# Patient Record
Sex: Male | Born: 1995 | Hispanic: No | Marital: Single | Smoking: Current every day smoker
Health system: Southern US, Community
[De-identification: ages and names within clinical notes are randomized; demographics above are authoritative.]

## PROBLEM LIST (undated history)

## (undated) DIAGNOSIS — R569 Unspecified convulsions: Secondary | ICD-10-CM

## (undated) DIAGNOSIS — F938 Other childhood emotional disorders: Secondary | ICD-10-CM

## (undated) DIAGNOSIS — F32A Depression, unspecified: Secondary | ICD-10-CM

## (undated) DIAGNOSIS — F329 Major depressive disorder, single episode, unspecified: Secondary | ICD-10-CM

## (undated) HISTORY — PX: CIRCUMCISION: SHX1350

---
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
## 2016-01-01 LAB — CBC WITH DIFFERENTIAL/PLATELET
Basophils Absolute: 0 cells/uL (ref 0–200)
Basophils Relative: 0 %
EOS ABS: 155 {cells}/uL (ref 15–500)
Eosinophils Relative: 5 %
HEMATOCRIT: 39.9 % (ref 38.5–50.0)
Hemoglobin: 13.1 g/dL — ABNORMAL LOW (ref 13.2–17.1)
LYMPHS PCT: 61 %
Lymphs Abs: 1891 cells/uL (ref 850–3900)
MCH: 30.3 pg (ref 27.0–33.0)
MCHC: 32.8 g/dL (ref 32.0–36.0)
MCV: 92.1 fL (ref 80.0–100.0)
MONO ABS: 341 {cells}/uL (ref 200–950)
MPV: 8.8 fL (ref 7.5–12.5)
Monocytes Relative: 11 %
NEUTROS PCT: 23 %
Neutro Abs: 713 cells/uL — ABNORMAL LOW (ref 1500–7800)
Platelets: 231 10*3/uL (ref 140–400)
RBC: 4.33 MIL/uL (ref 4.20–5.80)
RDW: 13.2 % (ref 11.0–15.0)
WBC: 3.1 10*3/uL — AB (ref 3.8–10.8)

## 2016-01-01 LAB — COMPREHENSIVE METABOLIC PANEL
ALBUMIN: 4.3 g/dL (ref 3.6–5.1)
ALT: 9 U/L (ref 8–46)
AST: 15 U/L (ref 12–32)
Alkaline Phosphatase: 66 U/L (ref 48–230)
BILIRUBIN TOTAL: 0.4 mg/dL (ref 0.2–1.1)
BUN: 4 mg/dL — AB (ref 7–20)
CHLORIDE: 102 mmol/L (ref 98–110)
CO2: 29 mmol/L (ref 20–31)
CREATININE: 0.69 mg/dL (ref 0.60–1.26)
Calcium: 9.1 mg/dL (ref 8.9–10.4)
Glucose, Bld: 81 mg/dL (ref 70–99)
Potassium: 3.9 mmol/L (ref 3.8–5.1)
SODIUM: 140 mmol/L (ref 135–146)
TOTAL PROTEIN: 7.4 g/dL (ref 6.3–8.2)

## 2016-01-01 LAB — LIPASE: LIPASE: 10 U/L (ref 7–60)

## 2016-01-01 LAB — AMMONIA

## 2016-01-01 LAB — VALPROIC ACID LEVEL: VALPROIC ACID LVL: 95.6 ug/mL (ref 50.0–100.0)

## 2016-01-01 LAB — TSH: TSH: 4.21 m[IU]/L (ref 0.50–4.30)

## 2016-01-01 LAB — AMYLASE: Amylase: 101 U/L (ref 0–105)

## 2016-01-18 ENCOUNTER — Emergency Department (HOSPITAL_COMMUNITY)
Admission: EM | Admit: 2016-01-18 | Discharge: 2016-01-19 | Disposition: A | Discharge status: Other Acute Inpatient Hospital | Payer: Medicaid Other | Attending: Emergency Medicine | Admitting: Emergency Medicine

## 2016-01-18 ENCOUNTER — Encounter (HOSPITAL_COMMUNITY): Payer: Self-pay | Admitting: Emergency Medicine

## 2016-01-18 DIAGNOSIS — Z833 Family history of diabetes mellitus: Secondary | ICD-10-CM

## 2016-01-18 DIAGNOSIS — T50904A Poisoning by unspecified drugs, medicaments and biological substances, undetermined, initial encounter: Secondary | ICD-10-CM

## 2016-01-18 DIAGNOSIS — F332 Major depressive disorder, recurrent severe without psychotic features: Secondary | ICD-10-CM

## 2016-01-18 DIAGNOSIS — Z91011 Allergy to milk products: Secondary | ICD-10-CM

## 2016-01-18 DIAGNOSIS — T450X2A Poisoning by antiallergic and antiemetic drugs, intentional self-harm, initial encounter: Secondary | ICD-10-CM | POA: Insufficient documentation

## 2016-01-18 DIAGNOSIS — T391X2A Poisoning by 4-Aminophenol derivatives, intentional self-harm, initial encounter: Secondary | ICD-10-CM | POA: Insufficient documentation

## 2016-01-18 DIAGNOSIS — Z8249 Family history of ischemic heart disease and other diseases of the circulatory system: Secondary | ICD-10-CM | POA: Diagnosis not present

## 2016-01-18 DIAGNOSIS — Z79899 Other long term (current) drug therapy: Secondary | ICD-10-CM | POA: Insufficient documentation

## 2016-01-18 LAB — HEPATIC FUNCTION PANEL
ALBUMIN: 3.9 g/dL (ref 3.5–5.0)
ALK PHOS: 67 U/L (ref 38–126)
ALT: 13 U/L — AB (ref 17–63)
AST: 18 U/L (ref 15–41)
Bilirubin, Direct: <0.1 mg/dL — ABNORMAL LOW (ref 0.1–0.5)
TOTAL PROTEIN: 7 g/dL (ref 6.5–8.1)
Total Bilirubin: 0.6 mg/dL (ref 0.3–1.2)

## 2016-01-18 LAB — COMPREHENSIVE METABOLIC PANEL
ALBUMIN: 5 g/dL (ref 3.5–5.0)
ALT: 15 U/L — ABNORMAL LOW (ref 17–63)
ANION GAP: 10 (ref 5–15)
AST: 24 U/L (ref 15–41)
Alkaline Phosphatase: 83 U/L (ref 38–126)
BILIRUBIN TOTAL: 0.6 mg/dL (ref 0.3–1.2)
BUN: 10 mg/dL (ref 6–20)
CO2: 27 mmol/L (ref 22–32)
Calcium: 9.5 mg/dL (ref 8.9–10.3)
Chloride: 101 mmol/L (ref 101–111)
Creatinine, Ser: 0.85 mg/dL (ref 0.61–1.24)
GFR calc non Af Amer: >60 mL/min (ref >60)
GLUCOSE: 104 mg/dL — AB (ref 65–99)
POTASSIUM: 3.3 mmol/L — AB (ref 3.5–5.1)
SODIUM: 138 mmol/L (ref 135–145)
TOTAL PROTEIN: 9 g/dL — AB (ref 6.5–8.1)

## 2016-01-18 LAB — CBC
HEMATOCRIT: 43.3 % (ref 39.0–52.0)
Hemoglobin: 14.7 g/dL (ref 13.0–17.0)
MCH: 30.5 pg (ref 26.0–34.0)
MCHC: 33.9 g/dL (ref 30.0–36.0)
MCV: 89.8 fL (ref 78.0–100.0)
Platelets: 323 10*3/uL (ref 150–400)
RBC: 4.82 MIL/uL (ref 4.22–5.81)
RDW: 12.3 % (ref 11.5–15.5)
WBC: 5.2 10*3/uL (ref 4.0–10.5)

## 2016-01-18 LAB — RAPID URINE DRUG SCREEN, HOSP PERFORMED
Amphetamines: NOT DETECTED
Barbiturates: NOT DETECTED
Benzodiazepines: NOT DETECTED
COCAINE: NOT DETECTED
OPIATES: NOT DETECTED
TETRAHYDROCANNABINOL: NOT DETECTED

## 2016-01-18 LAB — ACETAMINOPHEN LEVEL: Acetaminophen (Tylenol), Serum: 38 ug/mL — ABNORMAL HIGH (ref 10–30)

## 2016-01-18 LAB — ETHANOL

## 2016-01-18 LAB — SALICYLATE LEVEL: Salicylate Lvl: <7 mg/dL (ref 2.8–30.0)

## 2016-01-18 MED ORDER — FLUVOXAMINE MALEATE 100 MG PO TABS
100.0000 mg | ORAL_TABLET | Freq: Two times a day (BID) | ORAL | Status: DC
  Administered 2016-01-18 – 2016-01-19 (×3): 100 mg via ORAL
  Filled 2016-01-18 (×3): qty 1

## 2016-01-18 MED ORDER — DIVALPROEX SODIUM 250 MG PO DR TAB
250.0000 mg | DELAYED_RELEASE_TABLET | Freq: Every day | ORAL | Status: DC
  Administered 2016-01-18 – 2016-01-19 (×2): 250 mg via ORAL
  Filled 2016-01-18 (×2): qty 1

## 2016-01-18 MED ORDER — SODIUM CHLORIDE 0.9 % IV BOLUS (SEPSIS)
1000.0000 mL | Freq: Once | INTRAVENOUS | Status: AC
  Administered 2016-01-18: 1000 mL via INTRAVENOUS

## 2016-01-18 MED ORDER — ONDANSETRON HCL 4 MG PO TABS: 4.0000 mg | ORAL_TABLET | Freq: Three times a day (TID) | ORAL | Status: DC | PRN

## 2016-01-18 MED ORDER — DIVALPROEX SODIUM 500 MG PO DR TAB
500.0000 mg | DELAYED_RELEASE_TABLET | Freq: Every day | ORAL | Status: DC
  Administered 2016-01-18: 500 mg via ORAL
  Filled 2016-01-18: qty 1
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
## 2016-01-18 NOTE — ED Notes (Signed)
Bed: WA33 Expected date:  Expected time:  Means of arrival:  Comments: 

## 2016-01-18 NOTE — ED Notes (Signed)
Bed: [facility]ALA Expected date:  Expected time:  Means of arrival:  Comments: 

## 2016-01-18 NOTE — ED Notes (Signed)
Poison control states they are closing his case at this time  No further recommendations given

## 2016-01-18 NOTE — ED Notes (Signed)
Telepsych in progress  Acetaminophen level called to poison control

## 2016-01-18 NOTE — ED Notes (Signed)
Parents at bedside

## 2016-01-18 NOTE — ED Notes (Signed)
TTS team is seeing him. He remains calm and his mother has been, and remains with him also.
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
## 2016-01-18 NOTE — ED Provider Notes (Signed)
[facility]-EMERGENCY DEPT Provider Note   CSN: [id number] Arrival date & time: 01/18/16  0004  By signing my name below, I, [name], attest that this documentation has been prepared under the direction and in the presence of Marlon Peliffany Aysiah Jurado, [name]. Electronically Signed: [name], ED Scribe. 01/18/16. 12:21 AM.    History   Chief Complaint Chief Complaint  Patient presents with  . Ingestion    HPI Stephen [name] is a 20 y.o. male who presents to the Emergency Department voluntarily for evaluation of medication overdose that occurred at 11:15PM. Per EMS, pt took 10,000 mg Tylenol, 125 mg Benadryl and drank 300 mL Nyquil after witnessing an argument between his mother and a friend. Pt states he took the medications because "I was mad". He states he was aware of the adverse effects the medications may have prior to taking them, but was too upset and took them anyway. EMS doubts there was significant intent to harm himself. Pt denies tinnitus, abdominal pain, CP, SI, HI. No h/o medication overdose. Per EMS, pt has not been taking his prescribed medications for at least 5 months. He is a non-drinker, non-smoker.   The history is provided by the patient and the EMS personnel. No language interpreter was used.    Past Medical History:  Diagnosis Date  . 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 Seizures [facility](HCC)     Patient Active Problem List   Diagnosis Date Noted  . Outbursts of anger 12/31/2015  . Encounter for long-term (current) use of high-risk medication 12/23/2014  . ODD (oppositional defiant disorder) 11/12/2014  . 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 MDD (major depressive disorder), recurrent episode, moderate (HCC) 11/12/2014  . 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 Seizure disorder (HCC) 08/31/2012    Past Surgical History:  Procedure Laterality Date  . CIRCUMCISION         Home Medications    Prior to Admission  medications   Medication Sig Start Date End Date Taking? Authorizing Provider  divalproex (DEPAKOTE) 250 MG DR tablet Take 1 tablet (250 mg total) by mouth daily with breakfast. 12/31/15  Yes Keturah Shaverseza Nabizadeh, MD  divalproex (DEPAKOTE) 500 MG DR tablet Take 1 tablet (500 mg total) by mouth at bedtime. 12/31/15  Yes Keturah Shaverseza Nabizadeh, MD  fluvoxaMINE (LUVOX) 100 MG tablet Take 100 mg by mouth 2 (two) times daily. 02/14/15  Yes Historical Provider, MD  mupirocin ointment (BACTROBAN) 2 % Apply to affected area BID. Patient not taking: Reported on 01/18/2016 09/24/15   Janne NapoleonHope M Neese, NP    Family History Family History  Problem Relation Age of Onset  . Diabetes gravidarum Paternal Grandmother   . Heart attack Paternal Grandmother     Social History Social History  Substance Use Topics  . Smoking status: Never Smoker  . Smokeless tobacco: Never Used  . Alcohol use No     Allergies   Lactose intolerance (gi)   Review of Systems Review of Systems  HENT: Negative for tinnitus.   Cardiovascular: Negative for chest pain.  Gastrointestinal: Negative for abdominal pain.  Psychiatric/Behavioral: Negative for suicidal ideas.  All other systems reviewed and are negative.    Physical Exam Updated Vital Signs BP 132/73   Pulse 81   Temp 100 F (37.8 C) (Oral)   Resp 19   Ht 5\' 9"  (1.753 m)   SpO2 97%   Physical Exam  Constitutional: He is oriented to person, place, and time. He appears well-developed and  well-nourished. No distress.  Pt is drowsy  HENT:  Head: Normocephalic and atraumatic.  Right Ear: Tympanic membrane and ear canal normal.  Left Ear: Tympanic membrane and ear canal normal.  Nose: Nose normal.  Mouth/Throat: Uvula is midline, oropharynx is clear and moist and mucous membranes are normal.  Eyes: Conjunctivae are normal. Pupils are equal, round, and reactive to light.  Neck: Normal range of motion. Neck supple.  Cardiovascular: Normal rate and regular rhythm.     Pulmonary/Chest: Effort normal. No respiratory distress.  Abdominal: Soft. He exhibits no distension.  No signs of abdominal distention  Musculoskeletal: Normal range of motion.  No LE swelling  Neurological: He is alert and oriented to person, place, and time. He has normal strength. No sensory deficit.  Skin: Skin is warm. No rash noted. He is diaphoretic.  Psychiatric: He has a normal mood and affect. His behavior is normal. He expresses no homicidal and no suicidal ideation. He expresses no suicidal plans and no homicidal plans.  Nursing note and vitals reviewed.    ED Treatments / Results   DIAGNOSTIC STUDIES: Oxygen Saturation is 100% on RA, normal by my interpretation.    COORDINATION OF CARE: 12:17 AM Discussed treatment plan with pt at bedside which includes lab work and pt agreed to plan.    Labs (all labs ordered are listed, but only abnormal results are displayed) Labs Reviewed  COMPREHENSIVE METABOLIC PANEL - Abnormal; Notable for the following:       Result Value   Potassium 3.3 (*)    Glucose, Bld 104 (*)    Total Protein 9.0 (*)    ALT 15 (*)    All other components within normal limits  ACETAMINOPHEN LEVEL - Abnormal; Notable for the following:    Acetaminophen (Tylenol), Serum 38 (*)    All other components within normal limits  CBC  SALICYLATE LEVEL  ETHANOL  RAPID URINE DRUG SCREEN, HOSP PERFORMED    EKG  EKG Interpretation  Date/Time:  Sunday January 18 2016 00:13:26 EST Ventricular Rate:  126 PR Interval:    QRS Duration: 88 QT Interval:  292 QTC Calculation: 423 R Axis:   31 Text Interpretation:  Sinus tachycardia Consider right atrial enlargement Borderline T wave abnormalities No significant change was found Confirmed by CAMPOS  MD, KEVIN ([id number]) on 01/18/2016 1:47:09 AM       Radiology No results found.  Procedures Procedures (including critical care time)  Medications Ordered in ED Medications  ondansetron (ZOFRAN) tablet 4  mg (not administered)  divalproex (DEPAKOTE) DR tablet 250 mg (not administered)  divalproex (DEPAKOTE) DR tablet 500 mg (not administered)  fluvoxaMINE (LUVOX) tablet 100 mg (not administered)  sodium chloride 0.9 % bolus 1,000 mL (0 mLs Intravenous Stopped 01/18/16 0152)  sodium chloride 0.9 % bolus 1,000 mL (1,000 mLs Intravenous New Bag/Given 01/18/16 0152)     Initial Impression / Assessment and Plan / ED Course  I have reviewed the triage vital signs and the nursing notes.  Pertinent labs & imaging results that were available during my care of the patient were reviewed by me and considered in my medical decision making (see chart for details).  Clinical Course    Poison Control Recommendation: Draw 4 hr post ingestion tylenol level, Monitor for delerium, hallucinations, agitation, urinary retention, decreased bowel sounds, seizures, If seizures or agitation use benzos first and if not helping may use phenobaribitol May see some conduction delays For a QRS >120 given sodium bicarb boluses Obtain an EKG  now and caridac monitor Monitor pt for 6 hrs or untilo asymtomatic   @ 4:00am the patients 4 hour Tylenol level is 38, according to the [facility]ack-Matthew chart this is not a treatable level. Patients vital signs are stable, he is awake and talking. Pt medically cleared for psychiatric evaluation.  Blood pressure 132/73, pulse 81, temperature 100 F (37.8 C), temperature source Oral, resp. rate 19, height 5\' 9"  (1.753 m), SpO2 97 %.   Final Clinical Impressions(s) / ED Diagnoses   Final diagnoses:  Drug overdose, undetermined intent, initial encounter    New Prescriptions New Prescriptions   No medications on file  I personally performed the services described in this documentation, which was scribed in my presence. The recorded information has been reviewed and is accurate.    Marlon Pel, [name] 01/18/16 0411    Azalia Bilis, MD 01/18/16 [phone]

## 2016-01-18 NOTE — ED Notes (Signed)
Pt belongings placed in cabinet on Res side marked pt belongings for rooms 16-19

## 2016-01-18 NOTE — ED Triage Notes (Signed)
Per EMS pt took a whole bottle of night time cold and flu medicine, 6 extra Tylenol, and 5 benadryl tablets around 2315  Pt is currently living with his mother for the past 5 months  Pt used to live with his father but his father took him off all of his seizure and psych meds  There are some issues between the pt and his mother  Modena Janskyonight his mother had an altercation with one of his friends and upset him so he got mad and took the medication   Pt states he was not trying to kill himself   Pt states after he took the meds he read the warnings and called EMS

## 2016-01-18 NOTE — ED Notes (Signed)
Pt is given a meal. Denies any SI. Pt is very polite, calm and cooperative

## 2016-01-18 NOTE — ED Notes (Signed)
Poison control called for update on pt.  

## 2016-01-18 NOTE — ED Notes (Signed)
He awakens easily and eats his breakfast without difficulty.

## 2016-01-19 DIAGNOSIS — F332 Major depressive disorder, recurrent severe without psychotic features: Secondary | ICD-10-CM

## 2016-01-19 NOTE — BH Assessment (Signed)
Faxed information to [facility] following facilities for placement:  [facility]   [address]Theodosia Bahena Ellis Patsy BaltimoreWarrick Jr, [facility], [facility], [facility] Triage Specialist [phone]

## 2016-01-19 NOTE — ED Notes (Signed)
[facility] after 12[facility], voluntary

## 2016-01-19 NOTE — ED Notes (Signed)
Pt talked to Child psychotherapistsocial worker and refuses to go [facility], awaiting to see psych MD

## 2016-01-19 NOTE — ED Notes (Signed)
Psych MD at bedside

## 2016-01-19 NOTE — BH Assessment (Signed)
[facility] [place] [facility] who said Pt has been accepted to their 1-West Unit by Dr. Regino SchultzeWang after 1200. Number for nursing report is [phone]. Notified Dr. Nicanor AlconPalumbo and TCU staff of acceptance.   Harlin RainFord Ellis Patsy BaltimoreWarrick Jr, LPC, [facility], [facility] Triage Specialist [phone]

## 2016-01-19 NOTE — ED Notes (Signed)
[facility], voluntary

## 2016-10-11 ENCOUNTER — Encounter (HOSPITAL_COMMUNITY): Payer: Self-pay | Admitting: Emergency Medicine

## 2016-10-11 ENCOUNTER — Emergency Department (HOSPITAL_COMMUNITY)
Admission: EM | Admit: 2016-10-11 | Discharge: 2016-10-11 | Disposition: A | Discharge status: Home / Self Care | Payer: Medicaid Other | Attending: Emergency Medicine | Admitting: Emergency Medicine

## 2016-10-11 DIAGNOSIS — H5713 Ocular pain, bilateral: Secondary | ICD-10-CM | POA: Insufficient documentation

## 2016-10-11 DIAGNOSIS — R21 Rash and other nonspecific skin eruption: Secondary | ICD-10-CM | POA: Insufficient documentation

## 2016-10-11 DIAGNOSIS — Z79899 Other long term (current) drug therapy: Secondary | ICD-10-CM | POA: Insufficient documentation

## 2016-10-11 DIAGNOSIS — T7840XA Allergy, unspecified, initial encounter: Secondary | ICD-10-CM

## 2016-10-11 MED ORDER — NAPHAZOLINE-PHENIRAMINE 0.025-0.3 % OP SOLN: 1.0000 [drp] | Freq: Four times a day (QID) | OPHTHALMIC | 0 refills | Status: DC | PRN

## 2016-10-11 MED ORDER — HYDROCORTISONE 1 % EX CREA: TOPICAL_CREAM | CUTANEOUS | 0 refills | Status: DC

## 2016-10-11 NOTE — ED Triage Notes (Signed)
States has had drainage from eyes and mouth issues x 2 weeks , feels like it is allergic reaction unsure what

## 2016-10-11 NOTE — Discharge Instructions (Signed)
Apply hydrocortisone cream to the area just above the lip two times a day. Do not put any in the mouth. Do not put any in the eye.   Use the eye drops as directed.   You can take Benadryl as directed. This medication will make you sleepy.   Follow-up with her primary care doctor next 24-48 hours.  Return the emergency Department for any worsening rash, vision changes, eye pain, redness, difficulty breathing, chest pain or any other worsening or concerning symptoms.

## 2016-10-11 NOTE — ED Provider Notes (Signed)
[facility]-EMERG[facility]Y DEPT Provider Note   CSN: [id number] Arrival date & time: 10/11/16  1157     History   Chief Complaint Chief Complaint  Patient presents with  . Allergic Reaction  . Eye Pain    HPI Stephen [name] is a 21 y.o. male who presents with 2 weeks of rash and irritation to the surrounding eyes bilaterally and the upper lip area. Patient states that rash initially began 2 weeks ago he does not know of any new exposures or any unusual foods that he ate prior to onset of symptoms. He states that rash is irritating but not itchy or painful. He states that he bought over-the-counter medication but did not take anything. He states that he is having some watery discharge from bilateral eyes. He denies any eye pain, eye redness, vision changes, photophobia. He is not having any purulent drainage from the eyes. Patient denies any lip or tongue swelling, difficulty breathing, chest pain, difficulty swallowing, vomiting.   The history is provided by the patient.    Past Medical History:  Diagnosis Date  . 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 Seizures [facility])     Patient Active Problem List   Diagnosis Date Noted  . 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 Outbursts of anger 12/31/2015  . Encounter for long-term (current) use of high-risk medication 12/23/2014  . ODD (oppositional defiant disorder) 11/12/2014  . 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 MDD (major depressive disorder), recurrent episode, moderate (HCC) 11/12/2014  . 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 Seizure disorder (HCC) 08/31/2012    Past Surgical History:  Procedure Laterality Date  . CIRCUMCISION         Home Medications    Prior to Admission medications   Medication Sig Start Date End Date Taking? Authorizing Provider  divalproex (DEPAKOTE) 250 MG DR tablet Take 1 tablet (250 mg total) by mouth  daily with breakfast. 12/31/15   Keturah Shavers, MD  divalproex (DEPAKOTE) 500 MG DR tablet Take 1 tablet (500 mg total) by mouth at bedtime. 12/31/15   Keturah Shavers, MD  fluvoxaMINE (LUVOX) 100 MG tablet Take 100 mg by mouth 2 (two) times daily. 02/14/15   [provider]  hydrocortisone cream 1 % Apply to the upper lip 2 times daily 10/11/16   Graciella Freer A, [name]  mupirocin ointment (BACTROBAN) 2 % Apply to affected area BID. Patient not taking: Reported on 01/18/2016 09/24/15   Janne Napoleon, NP  naphazoline-pheniramine (NAPHCON-A) 0.025-0.3 % ophthalmic solution Place 1 drop into both eyes every 6 (six) hours as needed for eye irritation. 10/11/16   Maxwell Caul, [name]    Family History Family History  Problem Relation Age of Onset  . Diabetes gravidarum Paternal Grandmother   . Heart attack Paternal Grandmother     Social History Social History  Substance Use Topics  . Smoking status: Never Smoker  . Smokeless tobacco: Never Used  . Alcohol use No     Allergies   Lactose intolerance (gi)   Review of Systems Review of Systems  Constitutional: Negative for chills and fever.  HENT: Negative for drooling and trouble swallowing.   Eyes: Negative for pain, redness and visual disturbance.       Watery discharge  Respiratory: Negative for shortness of breath.   Cardiovascular: Negative for chest pain.  Skin: Positive for rash.     Physical Exam Updated Vital Signs BP 133/78 (BP Location:  Left Arm)   Temp 99.3 F (37.4 C) (Oral)   Resp (!) 100   SpO2 100%   Physical Exam  Constitutional: He appears well-developed and well-nourished.  Sitting comfortably on examination table  HENT:  Head: Normocephalic and atraumatic.  Mouth/Throat: Uvula is midline and oropharynx is clear and moist. No oropharyngeal exudate, posterior oropharyngeal edema or posterior oropharyngeal erythema.  Posterior oropharynx is clear. No trismus. No evidence of peritonsillar  abscess.   Eyes: Pupils are equal, round, and reactive to light. Conjunctivae and EOM are normal. Right eye exhibits no chemosis, no discharge and no hordeolum. Left eye exhibits no chemosis, no discharge and no hordeolum. Right conjunctiva is not injected. Right conjunctiva has no hemorrhage. Left conjunctiva is not injected. Left conjunctiva has no hemorrhage. No scleral icterus. Right eye exhibits no nystagmus. Left eye exhibits no nystagmus.    Dry, scaly rash noted to the lower periorbital regions bilaterally. No overlying warmth, erythema or tenderness. Bilateral upper eyelids are dry and appear slightly thickened. No difficulty opening and closing lids. No evidence of hordeolum. No chemosis. No tenderness to palpation to the upper periorbital regions bilaterally.   Neck:  No neck swelling   Cardiovascular: Normal rate, regular rhythm and normal pulses.   Pulmonary/Chest: Effort normal and breath sounds normal.  No evidence of respiratory distress. Able to speak in full sentences without difficulty.  Neurological: He is alert.  Skin: Skin is warm and dry. Capillary refill takes less than 2 seconds.  Dry, scaly rash noted to the left nasolabial sulcus. It does not cross the vermillion border of the lip. No urticaria or vesicles noted. No rash to the palms or soles of feet.   Psychiatric: He has a normal mood and affect. His speech is normal and behavior is normal.  Nursing note and vitals reviewed.    ED Treatments / Results  Labs (all labs ordered are listed, but only abnormal results are displayed) Labs Reviewed - No data to display  EKG  EKG Interpretation None       Radiology No results found.  Procedures Procedures (including critical care time)  Medications Ordered in ED Medications - No data to display   Initial Impression / Assessment and Plan / ED Course  I have reviewed the triage vital signs and the nursing notes.  Pertinent labs & imaging results that were  available during my care of the patient were reviewed by me and considered in my medical decision making (see chart for details).     21 y.o. male who presents with 2 weeks of rash to the left nasolabial fold and the lower periorbital region bilaterally. Also with some reports of watery discharge of eyes. No fevers, no vision changes, eye pain, eye redness. Patient is afebrile, non-toxic appearing, sitting comfortably on examination table. Vital signs reviewed and stable. RR seems to be entered in error. Patient with no evidence of respiratory distress. Patient has a diffuse, dry scaly rash to the nasolabial fold and the lower periorbital regions. Upper eyelids are slightly thickened but not edematous. No difficulties moving. Otherwise no other eye abnormalities. Consider contact dermatitis vs allergic reaction vs folliculitis vs allergic rhinitis. History/physical exam are not concerning for preseptal/orbital cellulitis, shingles. Plan to check visual acuity. Discussed with Dr. Rosalia Hammers. Will plan to treat as possible allergic reaction  Visual acuity as documented above. Discussed plan with patient. He is agreeable to plan. Patient instructed to follow-up with his PCP in 2-4 days for further evaluation. Strict return precautions  discussed. Patient expresses understanding and agreement to plan.        Visual Acuity  Right Eye Distance: 20/20 with Rx Corrective lens Left Eye Distance: 20/20 with Rx Corrective lens Bilateral Distance: 20/20 with Rx Corrective lens   Final Clinical Impressions(s) / ED Diagnoses   Final diagnoses:  Allergic reaction, initial encounter  Rash    New Prescriptions Discharge Medication List as of 10/11/2016  3:11 PM    START taking these medications   Details  hydrocortisone cream 1 % Apply to the upper lip 2 times daily, Print    naphazoline-pheniramine (NAPHCON-A) 0.025-0.3 % ophthalmic solution Place 1 drop into both eyes every 6 (six) hours as needed for eye  irritation., Starting Mon 10/11/2016, Print         Maxwell Caul, [name] 10/11/16 2236    Margarita Grizzle, MD 10/14/16 [phone]

## 2016-10-11 NOTE — ED Notes (Signed)
See EDP secondary assessment.  

## 2016-10-15 ENCOUNTER — Encounter (INDEPENDENT_AMBULATORY_CARE_PROVIDER_SITE_OTHER): Payer: Self-pay | Admitting: Neurology

## 2016-10-15 ENCOUNTER — Ambulatory Visit (INDEPENDENT_AMBULATORY_CARE_PROVIDER_SITE_OTHER): Payer: Self-pay | Admitting: Neurology

## 2016-10-15 VITALS — BP 118/88 | HR 80 | Ht 69.0 in | Wt 136.6 lb

## 2016-10-15 DIAGNOSIS — R569 Unspecified convulsions: Secondary | ICD-10-CM

## 2016-10-15 DIAGNOSIS — F39 Unspecified mood [affective] disorder: Secondary | ICD-10-CM

## 2016-10-15 DIAGNOSIS — R4586 Emotional lability: Secondary | ICD-10-CM | POA: Insufficient documentation

## 2016-10-15 DIAGNOSIS — R454 Irritability and anger: Secondary | ICD-10-CM

## 2016-10-15 LAB — CBC WITH DIFFERENTIAL/PLATELET
BASOS PCT: 0 %
Basophils Absolute: 0 cells/uL (ref 0–200)
EOS ABS: 240 {cells}/uL (ref 15–500)
Eosinophils Relative: 6 %
HEMATOCRIT: 39.5 % (ref 38.5–50.0)
HEMOGLOBIN: 13.2 g/dL (ref 13.2–17.1)
LYMPHS ABS: 2320 {cells}/uL (ref 850–3900)
LYMPHS PCT: 58 %
MCH: 29.8 pg (ref 27.0–33.0)
MCHC: 33.4 g/dL (ref 32.0–36.0)
MCV: 89.2 fL (ref 80.0–100.0)
MONO ABS: 440 {cells}/uL (ref 200–950)
MPV: 8.5 fL (ref 7.5–12.5)
Monocytes Relative: 11 %
NEUTROS ABS: 1000 {cells}/uL — AB (ref 1500–7800)
Neutrophils Relative %: 25 %
Platelets: 239 10*3/uL (ref 140–400)
RBC: 4.43 MIL/uL (ref 4.20–5.80)
RDW: 13.2 % (ref 11.0–15.0)
WBC: 4 10*3/uL (ref 3.8–10.8)

## 2016-10-15 MED ORDER — DIVALPROEX SODIUM 500 MG PO DR TAB: 500.0000 mg | DELAYED_RELEASE_TABLET | Freq: Every day | ORAL | 3 refills | Status: DC

## 2016-10-15 MED ORDER — DIVALPROEX SODIUM 250 MG PO DR TAB: 250.0000 mg | DELAYED_RELEASE_TABLET | Freq: Every day | ORAL | 3 refills | Status: DC

## 2016-10-15 NOTE — Progress Notes (Signed)
Patient: Stephen [name] MRN: [id number] Sex: male DOB: 1995/07/04  Provider: Keturah Shaverseza Geet Hosking, MD Location of Care[facility]  Note type: Routine return visit  Referral Source: Stephen DredgePeter Cocarro, MD History from: both parents, patient and Stephen [name] Chief Complaint: Generalized convulsive seizure  History of Present Illness: Stephen [name] is a 21 y.o. male is here for follow-up management of seizure disorder. He has history of generalized seizure disorder since 2015 and initially was on Keppra for a couple of years although he was not always compliant with the medication but after the first couple of years since he was having significant behavioral and mood issues, the medication was switched to Depakote in 2017 and currently he is on fairly low dose of Depakote at total of 750 mg daily. He was last seen in November 2017 when he was doing fairly well on low-dose of medication and his blood work was normal with valproic acid level of 98 which was probably not a trough level. His last EEG was in March 2017 which was unremarkable. Since his last visit and over the past 10 months he has been doing fairly well with no clinical seizure activity until last week when he had an episode of seizure activity for a few minutes when he was not on his medication. Several months ago he had an episode of behavioral outbursts and mood issues for which he had overdose off several medications for which she was admitted to the hospital with possibility of suicidal attempt and at that time he discontinued Depakote and over the past few months he was not on Depakote until last week when he had a clinical seizure activity so he started 500 mg of Depakote every night. He was previously on some sort of SSRI but he has not been seen by psychiatrist or behavioral health service recently and has not had any follow-up and currently not on any medication for his mood issues and he is not on behavioral  therapy.   Review of Systems: 12 system review as per HPI, otherwise negative.  Past Medical History:  Diagnosis Date  . 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 Seizures (HCC)    Hospitalizations: No., Head Injury: No., Nervous System Infections: No., Immunizations up to date: Yes.    Surgical History Past Surgical History:  Procedure Laterality Date  . CIRCUMCISION      Family History family history includes Diabetes gravidarum in his paternal grandmother; Heart attack in his paternal grandmother.   Social History Social History   Social History  . Marital status: Single    Spouse name: N/A  . Number of children: N/A  . Years of education: N/A   Social History Main Topics  . Smoking status: Never Smoker  . Smokeless tobacco: Never Used  . Alcohol use No  . Drug use: No  . Sexual activity: No   Other Topics Concern  . None   Social History Narrative   Stephen [name] is attending Stephen [name]. 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   Living with his father, step-mother, and siblings.    The medication list was reviewed and reconciled. All changes or newly prescribed medications were explained.  A complete medication list was provided to the patient/caregiver.  Allergies  Allergen Reactions  . Lactose Intolerance (Gi) Diarrhea    Physical Exam BP 118/88   Pulse 80   Ht 5\' 9"  (1.753 m)   Wt 136 lb 9.6 oz (62 kg)   BMI 20.17 kg/m  Gen: Awake,  alert, not in distress Skin: No rash, No neurocutaneous stigmata. HEENT: Normocephalic, no dysmorphic features,  nares patent, mucous membranes moist, oropharynx clear. Neck: Supple, no meningismus. No focal tenderness. Resp: Clear to auscultation bilaterally CV: Regular rate, normal S1/S2, no murmurs, no rubs Abd: BS present, abdomen soft, non-tender, non-distended. No hepatosplenomegaly or mass Ext: Warm and well-perfused. No deformities, no muscle wasting, ROM full.  Neurological Examination: MS: Awake, alert,  interactive but with flat affect and decreased eye contact, answered the questions appropriately, speech was fluent,  Normal comprehension.   Cranial Nerves: Pupils were equal and reactive to light ( 5-31mm);  normal fundoscopic exam with sharp discs, visual field full with confrontation test; EOM normal, no nystagmus; no ptsosis, no double vision, intact facial sensation, face symmetric with full strength of facial muscles, hearing intact to finger rub bilaterally, palate elevation is symmetric, tongue protrusion is symmetric with full movement to both sides.  Sternocleidomastoid and trapezius are with normal strength. Tone-Normal Strength-Normal strength in all muscle groups DTRs-  Biceps Triceps Brachioradialis Patellar Ankle  R 2+ 2+ 2+ 2+ 2+  L 2+ 2+ 2+ 2+ 2+   Plantar responses flexor bilaterally, no clonus noted Sensation: Intact to light touch,  Romberg negative. Coordination: No dysmetria on FTN test. No difficulty with balance. Gait: Normal walk.  Was able to perform toe walking and heel walking without difficulty.   Assessment and Plan 1. Generalized convulsive seizure (HCC)   2. Outbursts of anger   3. 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  He is also having some significant mood and behavioral issues with anger outbursts and occasionally aggressive behavior and significant depressed mood for which he had an episode of drug overdose and admitted to the hospital a few months ago. He hasn't been seen and followed by psychiatrist or behavioral health service and currently is not on any mood stabilizer. Discussed with patient and his father in details regarding several important issues: - He needs to establish a relation with a  PCP that he does not have right now - He needs to have a consult and close follow-up with an adult psychiatrist for diagnosis of depression and if there is any need for medication and also see a psychologist for therapy. This should be done through a referral from his PCP. He also needs to have a referral to adult neurology for continuing care since he is close to 21 year old at this time and it would be better for him to follow with an adult neurologist. Again he needs to have a referral through his PCP. At this time I recommended to continue with the fairly low dose of Depakote at 750 mg that he may take every night or 250 MG in a.m. and 500 mg in p.m. I would like to perform a follow-up EEG over the next few weeks. I also recommend to perform blood work including trough level of Depakote. I will make a follow-up appointment in 3 months just in case if he hasn't established any appointment with adult neurology but as soon as he would have an appointment with adult neurology, he does not need to follow with me anymore. I spent 45 minutes with patient and his father, more than 50% time spent for counseling and coordination  of care and emphasizing on all the above recommendations particularly follow-up with psychiatrist.  Meds ordered this encounter  Medications  . divalproex (DEPAKOTE) 500 MG DR tablet    Sig: Take 1 tablet (500 mg total) by mouth at bedtime.    Dispense:  30 tablet    Refill:  3  . divalproex (DEPAKOTE) 250 MG DR tablet    Sig: Take 1 tablet (250 mg total) by mouth daily with breakfast.    Dispense:  30 tablet    Refill:  3   Orders Placed This Encounter  Procedures  . Valproic acid level  . CBC with Differential/Platelet  . Comprehensive metabolic panel  . TSH  . EEG Child    Standing Status:   Future    Standing Expiration Date:   10/15/2017    Order Specific Question:   Where should this test be performed?    Answer:   [facility]

## 2016-10-15 NOTE — Patient Instructions (Addendum)
He needs to have a PCP for regular follow-up as soon as possible He needs to have a referral to a psychiatrist for evaluation of mood issues He needs to have a referral for a psychologist for therapy  If there is any behavioral outbursts or significant mood changes, he needs to go to the emergency room moderate headaches He also needs to have a referral to adult neurology since he is close to 21 year old  For now we will continue with low-dose Depakote  I would like to see him one more time in 3 months if he is still not scheduled with adult neurology

## 2016-10-16 LAB — COMPREHENSIVE METABOLIC PANEL
ALBUMIN: 4.7 g/dL (ref 3.6–5.1)
ALT: 8 U/L (ref 8–46)
AST: 17 U/L (ref 12–32)
Alkaline Phosphatase: 89 U/L (ref 48–230)
BUN: 7 mg/dL (ref 7–20)
CALCIUM: 9.4 mg/dL (ref 8.9–10.4)
CO2: 27 mmol/L (ref 20–32)
CREATININE: 0.78 mg/dL (ref 0.60–1.26)
Chloride: 102 mmol/L (ref 98–110)
Glucose, Bld: 86 mg/dL (ref 70–99)
Potassium: 4 mmol/L (ref 3.8–5.1)
SODIUM: 140 mmol/L (ref 135–146)
TOTAL PROTEIN: 7.9 g/dL (ref 6.3–8.2)
Total Bilirubin: 0.3 mg/dL (ref 0.2–1.1)

## 2016-10-16 LAB — VALPROIC ACID LEVEL: Valproic Acid Lvl: 84 mg/L (ref 50.0–100.0)

## 2016-10-16 LAB — TSH: TSH: 5.3 m[IU]/L — AB (ref 0.50–4.30)

## 2016-10-19 ENCOUNTER — Encounter (INDEPENDENT_AMBULATORY_CARE_PROVIDER_SITE_OTHER): Payer: Self-pay

## 2016-10-19 ENCOUNTER — Telehealth (INDEPENDENT_AMBULATORY_CARE_PROVIDER_SITE_OTHER): Payer: Self-pay | Admitting: Neurology

## 2016-10-19 NOTE — Telephone Encounter (Signed)
I reviewed the lab results which were essentially normal with normal level of Depakote at 84 but his TSH was 5.3 which is higher than normal and indicates the possibility of hypothyroidism particularly with clinical symptoms of sleeping too much and lack of interest.  Sarah, please call the patient's father and find out who the current PCP is and send the results. Patient needs further evaluation with PCP or endocrinologist for evaluation of thyroid disease.

## 2016-10-19 NOTE — Telephone Encounter (Signed)
Left message on voice mail to please have the patient return RN call- due to not having a signed release from the patient RN cannot give anyone else information. Advised RN has also mailed the information to the patient.

## 2016-10-21 ENCOUNTER — Other Ambulatory Visit (INDEPENDENT_AMBULATORY_CARE_PROVIDER_SITE_OTHER): Payer: Self-pay | Admitting: Neurology

## 2016-10-21 DIAGNOSIS — F411 Generalized anxiety disorder: Secondary | ICD-10-CM

## 2016-10-21 DIAGNOSIS — R569 Unspecified convulsions: Secondary | ICD-10-CM

## 2016-10-21 DIAGNOSIS — R946 Abnormal results of thyroid function studies: Secondary | ICD-10-CM

## 2016-10-26 ENCOUNTER — Encounter: Payer: Self-pay | Admitting: Emergency Medicine

## 2016-10-26 ENCOUNTER — Ambulatory Visit (INDEPENDENT_AMBULATORY_CARE_PROVIDER_SITE_OTHER): Payer: 59 | Admitting: Emergency Medicine

## 2016-10-26 VITALS — BP 125/84 | HR 72 | Temp 97.8°F | Resp 16 | Ht 69.0 in | Wt 139.0 lb

## 2016-10-26 DIAGNOSIS — R5383 Other fatigue: Secondary | ICD-10-CM | POA: Diagnosis not present

## 2016-10-26 DIAGNOSIS — R7989 Other specified abnormal findings of blood chemistry: Secondary | ICD-10-CM

## 2016-10-26 DIAGNOSIS — G40909 Epilepsy, unspecified, not intractable, without status epilepticus: Secondary | ICD-10-CM

## 2016-10-26 DIAGNOSIS — R21 Rash and other nonspecific skin eruption: Secondary | ICD-10-CM

## 2016-10-26 DIAGNOSIS — R946 Abnormal results of thyroid function studies: Secondary | ICD-10-CM | POA: Diagnosis not present

## 2016-10-26 NOTE — Patient Instructions (Addendum)
IF you received an x-ray today, you will receive an invoice from [facility]. Please contact [facility] at [phone] with questions or concerns regarding your invoice.   IF you received labwork today, you will receive an invoice from [facility]. Please contact [facility] at [phone] with questions or concerns regarding your invoice.   Our billing staff will not be able to assist you with questions regarding bills from these companies.  You will be contacted with the lab results as soon as they are available. The fastest way to get your results is to activate your My Chart account. Instructions are located on the last page of this paperwork. If you have not heard from us regarding the results in 2 weeks, please contact this office.      Seizure, Adult When you have a seizure:  Parts of your body may move.  How aware or awake (conscious) you are may change.  You may shake (convulse).  Some people have symptoms right before a seizure happens. These symptoms may include:  Fear.  Worry (anxiety).  Feeling like you are going to throw up (nausea).  Feeling like the room is spinning (vertigo).  Feeling like you saw or heard something before (deja vu).  Odd tastes or smells.  Changes in vision, such as seeing flashing lights or spots.  Seizures usually last from 30 seconds to 2 minutes. Usually, they are not harmful unless they last a long time. Follow these instructions at home: Medicines  Take over-the-counter and prescription medicines only as told by your doctor.  Avoid anything that may keep your medicine from working, such as alcohol. Activity  Do not do any activities that would be dangerous if you had another seizure, like driving or swimming. Wait until your doctor approves.  If you live in the [place] ask your local DMV (department of motor vehicles) when you can drive.  Rest. Teaching others  Teach friends and family what to do when  you have a seizure. They should: ? Lay you on the ground. ? Protect your head and body. ? Loosen any tight clothing around your neck. ? Turn you on your side. ? Stay with you until you are better. ? Not hold you down. ? Not put anything in your mouth. ? Know whether or not you need emergency care. General instructions  Contact your doctor each time you have a seizure.  Avoid anything that gives you seizures.  Keep a seizure diary. Write down: ? What you think caused each seizure. ? What you remember about each seizure.  Keep all follow-up visits as told by your doctor. This is important. Contact a doctor if:  You have another seizure.  You have seizures more often.  There is any change in what happens during your seizures.  You continue to have seizures with treatment.  You have symptoms of being sick or having an infection. Get help right away if:  You have a seizure: ? That lasts longer than 5 minutes. ? That is different than seizures you had before. ? That makes it harder to breathe. ? After you hurt your head.  After a seizure, you cannot speak or use a part of your body.  After a seizure, you are confused or have a bad headache.  You have two or more seizures in a row.  You are having seizures more often.  You do not wake up right after a seizure.  You get hurt during a seizure. In an emergency:  These symptoms may be an emergency. Do not wait to see if the symptoms will go away. Get medical help right away. Call your local emergency services (911 in the [place] Do not drive yourself to the hospital. This information is not intended to replace advice given to you by your health care provider. Make sure you discuss any questions you have with your health care provider. Document Released: 07/21/2007 Document Revised: 10/15/2015 Document Reviewed: 10/15/2015 Elsevier Interactive Patient Education  2017 [employer].

## 2016-10-26 NOTE — Progress Notes (Signed)
Stephen [name] 21 y.o.   Chief Complaint  Patient presents with  . Seizures    has hx of seizures; currently on meds and wants to be referred to a brain doctor  . Eye Problem    states he may be allergic to something; was seen in ER but given only eye drops    HISTORY OF PRESENT ILLNESS: This is a 21 y.o. male with h/o seizures; recently seen by pediatrician who tpld father he needed to get adult PCP and Neuro referral; on medications; stable; last seizure 2 mos ago. Also c/o rash around eyes and mouth, uncertain etiology. Recent blood work showed high TSH raising possibility of hypothyroidism.  HPI   Prior to Admission medications   Medication Sig Start Date End Date Taking? Authorizing Provider  divalproex (DEPAKOTE) 250 MG DR tablet Take 1 tablet (250 mg total) by mouth daily with breakfast. 10/15/16  Yes Keturah ShaversNabizadeh, Reza, MD  divalproex (DEPAKOTE) 500 MG DR tablet Take 1 tablet (500 mg total) by mouth at bedtime. 10/15/16  Yes Keturah ShaversNabizadeh, Reza, MD  fluvoxaMINE (LUVOX) 100 MG tablet Take 100 mg by mouth 2 (two) times daily. 02/14/15  Yes [provider]  naphazoline-pheniramine (NAPHCON-A) 0.025-0.3 % ophthalmic solution Place 1 drop into both eyes every 6 (six) hours as needed for eye irritation. 10/11/16  Yes Maxwell CaulLayden, Lindsey A, [name]    Allergies  Allergen Reactions  . Lactose Intolerance (Gi) Diarrhea    Patient Active Problem List   Diagnosis Date Noted  . Mood changes (HCC) 10/15/2016  . 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 Outbursts of anger 12/31/2015  . Encounter for long-term (current) use of high-risk medication 12/23/2014  . ODD (oppositional defiant disorder) 11/12/2014  . 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 MDD (major depressive disorder), recurrent episode, moderate (HCC) 11/12/2014  . 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 Seizure disorder (HCC) 08/31/2012    Past  Medical History:  Diagnosis Date  . 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 Seizures (HCC)     Past Surgical History:  Procedure Laterality Date  . CIRCUMCISION      Social History   Social History  . Marital status: Single    Spouse name: N/A  . Number of children: N/A  . Years of education: N/A   Occupational History  . Not on file.   Social History Main Topics  . Smoking status: Never Smoker  . Smokeless tobacco: Never Used  . Alcohol use No  . Drug use: No  . Sexual activity: No   Other Topics Concern  . Not on file   Social History Narrative   Rosanne [name] is attending [employer]. 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   Living with his father, step-mother, and siblings.    Family History  Problem Relation Age of Onset  . Diabetes gravidarum Paternal Grandmother   . Heart attack Paternal Grandmother      Review of Systems  Constitutional: Positive for malaise/fatigue. Negative for chills and fever.  HENT: Negative.   Eyes: Negative.   Respiratory: Negative.  Negative for cough, shortness of breath and wheezing.   Cardiovascular: Negative.  Negative for chest pain, palpitations, claudication and leg swelling.  Gastrointestinal: Negative.  Negative for abdominal pain, diarrhea, nausea and vomiting.  Genitourinary: Negative.  Negative for dysuria, flank pain and frequency.  Musculoskeletal: Negative for back pain, myalgias and neck pain.  Skin: Positive for rash (facial).  Neurological: Positive for  seizures and weakness. Negative for dizziness and headaches.  Endo/Heme/Allergies: Negative.   All other systems reviewed and are negative.   Vitals:   10/26/16 1205  BP: 125/84  Pulse: 72  Resp: 16  Temp: 97.8 F (36.6 C)  SpO2: 99%    Physical Exam  Constitutional: He is oriented to person, place, and time. He appears well-developed and well-nourished.  HENT:  Head: Normocephalic.  Nose: Nose normal.  Mouth/Throat: Oropharynx is clear and  moist.  Eyes: Pupils are equal, round, and reactive to light. Conjunctivae and EOM are normal.  Neck: Normal range of motion. Neck supple.  Cardiovascular: Normal rate, regular rhythm, normal heart sounds and intact distal pulses.   Pulmonary/Chest: Effort normal and breath sounds normal.  Abdominal: Soft. Bowel sounds are normal. He exhibits no distension and no mass. There is no tenderness.  Musculoskeletal: Normal range of motion.  Neurological: He is alert and oriented to person, place, and time. He displays normal reflexes. No cranial nerve deficit or sensory deficit. He exhibits normal muscle tone. Coordination normal.  Skin: Capillary refill takes less than 2 seconds. Rash noted.  Dark large circles around eyes and mouth.  Psychiatric: He has a normal mood and affect. His behavior is normal.  Vitals reviewed.    ASSESSMENT & PLAN: Chandon was seen today for seizures and eye problem.  Diagnoses and all orders for this visit:  Seizure disorder [facility]) -     Ambulatory referral to Neurology  Facial rash -     Ambulatory referral to Dermatology  Lethargy -     Ambulatory referral to Endocrinology  Abnormal TSH -     Ambulatory referral to Endocrinology    Patient Instructions       IF you received an x-ray today, you will receive an invoice from [facility]. Please contact [facility] at [phone] with questions or concerns regarding your invoice.   IF you received labwork today, you will receive an invoice from [facility]. Please contact [facility] at [phone] with questions or concerns regarding your invoice.   Our billing staff will not be able to assist you with questions regarding bills from these companies.  You will be contacted with the lab results as soon as they are available. The fastest way to get your results is to activate your My Chart account. Instructions are located on the last page of this paperwork. If you have not heard from [place]  regarding the results in 2 weeks, please contact this office.      Seizure, Adult When you have a seizure:  Parts of your body may move.  How aware or awake (conscious) you are may change.  You may shake (convulse).  Some people have symptoms right before a seizure happens. These symptoms may include:  Fear.  Worry (anxiety).  Feeling like you are going to throw up (nausea).  Feeling like the room is spinning (vertigo).  Feeling like you saw or heard something before (deja vu).  Odd tastes or smells.  Changes in vision, such as seeing flashing lights or spots.  Seizures usually last from 30 seconds to 2 minutes. Usually, they are not harmful unless they last a long time. Follow these instructions at home: Medicines  Take over-the-counter and prescription medicines only as told by your doctor.  Avoid anything that may keep your medicine from working, such as alcohol. Activity  Do not do any activities that would be dangerous if you had another seizure, like driving or swimming. Wait until your  doctor approves.  If you live in the [place] ask your local DMV (department of motor vehicles) when you can drive.  Rest. Teaching others  Teach friends and family what to do when you have a seizure. They should: ? Lay you on the ground. ? Protect your head and body. ? Loosen any tight clothing around your neck. ? Turn you on your side. ? Stay with you until you are better. ? Not hold you down. ? Not put anything in your mouth. ? Know whether or not you need emergency care. General instructions  Contact your doctor each time you have a seizure.  Avoid anything that gives you seizures.  Keep a seizure diary. Write down: ? What you think caused each seizure. ? What you remember about each seizure.  Keep all follow-up visits as told by your doctor. This is important. Contact a doctor if:  You have another seizure.  You have seizures more often.  There is any  change in what happens during your seizures.  You continue to have seizures with treatment.  You have symptoms of being sick or having an infection. Get help right away if:  You have a seizure: ? That lasts longer than 5 minutes. ? That is different than seizures you had before. ? That makes it harder to breathe. ? After you hurt your head.  After a seizure, you cannot speak or use a part of your body.  After a seizure, you are confused or have a bad headache.  You have two or more seizures in a row.  You are having seizures more often.  You do not wake up right after a seizure.  You get hurt during a seizure. In an emergency:  These symptoms may be an emergency. Do not wait to see if the symptoms will go away. Get medical help right away. Call your local emergency services (911 in the [place] Do not drive yourself to the hospital. This information is not intended to replace advice given to you by your health care provider. Make sure you discuss any questions you have with your health care provider. Document Released: 07/21/2007 Document Revised: 10/15/2015 Document Reviewed: 10/15/2015 Elsevier Interactive Patient Education  2017 [employer] Inc[employer]      Edwina Barth, MD [facility]

## 2016-10-28 ENCOUNTER — Other Ambulatory Visit (INDEPENDENT_AMBULATORY_CARE_PROVIDER_SITE_OTHER): Payer: Medicaid Other

## 2016-10-29 ENCOUNTER — Other Ambulatory Visit (INDEPENDENT_AMBULATORY_CARE_PROVIDER_SITE_OTHER): Payer: Medicaid Other

## 2016-12-21 ENCOUNTER — Ambulatory Visit (INDEPENDENT_AMBULATORY_CARE_PROVIDER_SITE_OTHER): Payer: 59 | Admitting: Neurology

## 2016-12-21 ENCOUNTER — Encounter: Payer: Self-pay | Admitting: Neurology

## 2016-12-21 VITALS — Ht 69.0 in | Wt 142.0 lb

## 2016-12-21 DIAGNOSIS — G40909 Epilepsy, unspecified, not intractable, without status epilepticus: Secondary | ICD-10-CM | POA: Diagnosis not present

## 2016-12-21 DIAGNOSIS — F329 Major depressive disorder, single episode, unspecified: Secondary | ICD-10-CM

## 2016-12-21 DIAGNOSIS — F32A Depression, unspecified: Secondary | ICD-10-CM | POA: Insufficient documentation

## 2016-12-21 MED ORDER — LAMOTRIGINE 100 MG PO TABS: 100.0000 mg | ORAL_TABLET | Freq: Two times a day (BID) | ORAL | 11 refills | Status: DC

## 2016-12-21 MED ORDER — LAMOTRIGINE 25 MG PO TABS: ORAL_TABLET | ORAL | 0 refills | Status: DC

## 2016-12-21 NOTE — Progress Notes (Signed)
PATIENT: Stephen [name] DOB: 11-09-1995  Chief Complaint  Patient presents with  . Seizures    He is here with his father, Stephen [name].  Reports having his first seizure at age 21.  He was most recently taking Depakote DR 250mg  in am and 500mg  in pm.  He stopped the medication because it caused him to feel "mad".  His last seizure event was in August 2018.     HISTORICAL  Stephen [name] is a [age] year old male, accompanied by his father Stephen [name], seen in refer by his primary care doctor Dr. Alvy BimlerSagardia, [name] for evaluation of seizure, initial evaluation was on December 21 2016.   I reviewed and summarized the referring note, he has history of hypothyroidism, hypertension, coronary artery disease, chronic kidney disease, hyperlipidemia,   [place] was born [place] came [place] in [place],  he was born full-term, developmentally normal.  In 2014 he suffered his first seizure, patient contributed to lack of sleep, playing videogames for 5 days, his seizure preceded by feeling weird, then passed out generalized tonic-clonic seizure, he has 1-2 seizure every year, most recent one was on October 15 2016 while he was Depakote.  Father reported that he has raging episode while taking Depakote, now improved since he is is off Depakote on October 15 2016.  He also has previous medication overdose   Laboratory evaluations in August 2018, elevated TSH 5.3, normal CMP, cbc Depakote level 84,  I personally reviewed CT head without contrast in September 2016 that was normal  REVIEW OF SYSTEMS: Full 14 system review of systems performed and notable only for as above  ALLERGIES: Allergies  Allergen Reactions  . Lactose Intolerance (Gi) Diarrhea    HOME MEDICATIONS: Current Outpatient Medications  Medication Sig Dispense Refill  . divalproex (DEPAKOTE) 250 MG DR tablet Take 1 tablet (250 mg total) by mouth daily with breakfast. 30 tablet 3  . divalproex (DEPAKOTE) 500 MG DR tablet Take 1 tablet  (500 mg total) by mouth at bedtime. 30 tablet 3  . fluvoxaMINE (LUVOX) 100 MG tablet Take 100 mg by mouth 2 (two) times daily.  0  . naphazoline-pheniramine (NAPHCON-A) 0.025-0.3 % ophthalmic solution Place 1 drop into both eyes every 6 (six) hours as needed for eye irritation. 5 mL 0   No current facility-administered medications for this visit.     PAST MEDICAL HISTORY: Past Medical History:  Diagnosis Date  . 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 Seizures (HCC)     PAST SURGICAL HISTORY: Past Surgical History:  Procedure Laterality Date  . CIRCUMCISION      FAMILY HISTORY: Family History  Problem Relation Age of Onset  . Diabetes gravidarum Paternal Grandmother   . Heart attack Paternal Grandmother   . Healthy Mother   . Healthy Father     SOCIAL HISTORY:  Social History   Socioeconomic History  . Marital status: Single    Spouse name: Not on file  . Number of children: 0  . Years of education: Currently in college  . Highest education level: Not on file  Social Needs  . Financial resource strain: Not on file  . Food insecurity - worry: Not on file  . Food insecurity - inability: Not on file  . Transportation needs - medical: Not on file  . Transportation needs - non-medical: Not on file  Occupational History  . Occupation: Consulting civil engineertudent at [employer]  Tobacco Use  . Smoking status: Never Smoker  . Smokeless  tobacco: Never Used  Substance and Sexual Activity  . Alcohol use: No  . Drug use: No  . Sexual activity: No  Other Topics Concern  . Not on file  Social History Narrative   Stephen [name] is attending [employer]. 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   Living with his father, step-mother, and siblings.   Right-handed.   He will sometimes drink a soda.     PHYSICAL EXAM   Vitals:   12/21/16 1045  Weight: 142 lb (64.4 kg)  Height: 5\' 9"  (1.753 m)    Not recorded      Body mass index is 20.97 kg/m.  PHYSICAL EXAMNIATION: Depressed-looking young  male   Gen: NAD, conversant, well nourised, obese, well groomed                     Cardiovascular: Regular rate rhythm, no peripheral edema, warm, nontender. Eyes: Conjunctivae clear without exudates or hemorrhage Neck: Supple, no carotid bruits. Pulmonary: Clear to auscultation bilaterally   NEUROLOGICAL EXAM:  MENTAL STATUS: Speech:    Speech is normal; fluent and spontaneous with normal comprehension.  Cognition:     Orientation to time, place and person     Normal recent and remote memory     Normal Attention span and concentration     Normal Language, naming, repeating,spontaneous speech     Fund of knowledge   CRANIAL NERVES: CN II: Visual fields are full to confrontation. Fundoscopic exam is normal with sharp discs and no vascular changes. Pupils are round equal and briskly reactive to light. CN III, IV, VI: extraocular movement are normal. No ptosis. CN V: Facial sensation is intact to pinprick in all 3 divisions bilaterally. Corneal responses are intact.  CN VII: Face is symmetric with normal eye closure and smile. CN VIII: Hearing is normal to rubbing fingers CN IX, X: Palate elevates symmetrically. Phonation is normal. CN XI: Head turning and shoulder shrug are intact CN XII: Tongue is midline with normal movements and no atrophy.  MOTOR: There is no pronator drift of out-stretched arms. Muscle bulk and tone are normal. Muscle strength is normal.  REFLEXES: Reflexes are 2+ and symmetric at the biceps, triceps, knees, and ankles. Plantar responses are flexor.  SENSORY: Intact to light touch, pinprick, positional sensation and vibratory sensation are intact in fingers and toes.  COORDINATION: Rapid alternating movements and fine finger movements are intact. There is no dysmetria on finger-to-nose and heel-knee-shin.    GAIT/STANCE:  Cautious,  Romberg is absent.   DIAGNOSTIC DATA (LABS, IMAGING, TESTING) - I reviewed patient records, labs, notes, testing and  imaging myself where available.   ASSESSMENT AND PLAN  Stephen [name] Tartt is a 21 y.o. male   Epilepsy Depression anxiety  Complete evaluation with MRI of the brain with without contrast  EEG  Lamotrigine titrating to 100 mg twice a day   Levert FeinsteinYijun Kymberli Wiegand, M.D. Ph.D.  [facility] [address], [address] [place], [place] [address] Ph: [phone] Fax: [phone]  CC: Referring Provider

## 2016-12-22 ENCOUNTER — Telehealth: Payer: Self-pay | Admitting: Neurology

## 2016-12-22 LAB — THYROID PANEL WITH TSH
Free Thyroxine Index: 1.7 (ref 1.2–4.9)
T3 UPTAKE RATIO: 23 % — AB (ref 24–39)
T4, Total: 7.4 ug/dL (ref 4.5–12.0)
TSH: 3.07 u[IU]/mL (ref 0.450–4.500)

## 2016-12-22 LAB — VITAMIN B12: VITAMIN B 12: 247 pg/mL (ref 232–1245)

## 2016-12-22 LAB — RPR: RPR: NONREACTIVE

## 2016-12-22 LAB — HIV ANTIBODY (ROUTINE TESTING W REFLEX): HIV Screen 4th Generation wRfx: NONREACTIVE

## 2016-12-22 LAB — VITAMIN D 25 HYDROXY (VIT D DEFICIENCY, FRACTURES): VIT D 25 HYDROXY: 9.9 ng/mL — AB (ref 30.0–100.0)

## 2016-12-22 NOTE — Telephone Encounter (Signed)
I spoke with patient's father, ok per dpr. I reviewed patient's lab results and made him aware to have the patient start on Vitamin D3 1000IU 2 tablets daily and repeat in 6 months.

## 2016-12-22 NOTE — Telephone Encounter (Signed)
Please call patient, laboratory evaluation showed significantly decreased vitamin D 9, he should take vitamin D3 1000 units, 2 tablets every day, repeat vitamin D level 6-12 months  Rest of the laboratory evaluation were within normal limits.

## 2016-12-22 NOTE — Telephone Encounter (Signed)
I left a message for patient to call me back to discuss his lab results.

## 2017-01-12 ENCOUNTER — Other Ambulatory Visit: Payer: 59

## 2017-01-13 ENCOUNTER — Encounter: Payer: Self-pay | Admitting: Neurology

## 2017-01-13 ENCOUNTER — Ambulatory Visit (INDEPENDENT_AMBULATORY_CARE_PROVIDER_SITE_OTHER): Payer: Medicaid Other | Admitting: Neurology

## 2017-03-24 ENCOUNTER — Ambulatory Visit: Payer: Self-pay | Admitting: Neurology

## 2017-03-25 ENCOUNTER — Encounter: Payer: Self-pay | Admitting: Neurology

## 2017-05-17 ENCOUNTER — Telehealth (INDEPENDENT_AMBULATORY_CARE_PROVIDER_SITE_OTHER): Payer: Self-pay | Admitting: Neurology

## 2017-05-17 NOTE — Telephone Encounter (Signed)
°  Mother dropped off FLMA papers to be filled out. #paid

## 2017-05-18 NOTE — Telephone Encounter (Signed)
Patient [facility] in November of 2018 for the same diagnosis. I left a message for patient advising to call back because Dr. Zannie CoveYan's office will need to complete the FMLA papers instead of our office. [facility] for them to retrieve. Stephen FalcoEmily M [name]

## 2017-05-31 ENCOUNTER — Telehealth: Payer: Self-pay | Admitting: *Deleted

## 2017-05-31 NOTE — Telephone Encounter (Signed)
Pt fmla form on [employer] desk

## 2017-05-31 NOTE — Telephone Encounter (Signed)
Patient no showed his EEG, never scheduled his MRI and no showed his follow up appt.  He has no appts pending.  I called his mother on HIPAA and informed her we are unable to complete the FMLA ppw she dropped off today for her employer (for her as his caregiver) until he completes his work-up and comes back for follow up.  She verbalized understanding.  She would like the ppw and $50 refund mailed back to her at home address.  She will either call back to schedule his appts or speak with his PCP concerning her FMLA ppw. Paperwork given back to [place] in medical records.

## 2017-06-01 NOTE — Telephone Encounter (Signed)
Pt form mailed to home address.

## 2017-06-22 ENCOUNTER — Telehealth: Payer: Self-pay | Admitting: *Deleted

## 2017-06-22 NOTE — Telephone Encounter (Signed)
Patient's mother stopped by our office today requesting a refund on the paperwork we were unable to complete.  The paperwork was mailed back to her on 06/01/17 but she has never received the refund.  Additionally, she scheduled her son's appointments today.   EEG - 07/18/17 OFFICE VISIT - 08/01/17  She would like a call back to get his MRI scheduled.

## 2017-06-22 NOTE — Telephone Encounter (Signed)
Noted, I sent the order to [facility] because he has medicaid they did try to reach out to patient on 12/27/16 & 01/03/17 .Stephen [name] I will send the order again to them and they will schedule the MRI

## 2017-06-22 NOTE — Telephone Encounter (Signed)
Patients mother and patient came to the office today to request a refund for paperwork we could not fill out for them and also wants an appt ASAP.  The first available was too far out.  Please call

## 2017-06-23 NOTE — Telephone Encounter (Signed)
Per Angie, the patient is not eligible for a refund due to bad debt.  She has applied it towards his balance so that he can continue care here.  She has left a message for his mother to return her call.

## 2017-07-18 ENCOUNTER — Other Ambulatory Visit: Payer: Self-pay

## 2017-08-01 ENCOUNTER — Other Ambulatory Visit: Payer: Self-pay | Admitting: *Deleted

## 2017-08-01 ENCOUNTER — Telehealth: Payer: Self-pay | Admitting: Neurology

## 2017-08-01 ENCOUNTER — Ambulatory Visit: Payer: Self-pay | Admitting: Neurology

## 2017-08-01 ENCOUNTER — Telehealth: Payer: Self-pay | Admitting: *Deleted

## 2017-08-01 DIAGNOSIS — G40909 Epilepsy, unspecified, not intractable, without status epilepticus: Secondary | ICD-10-CM

## 2017-08-01 NOTE — Telephone Encounter (Signed)
Noted, order sent to [facility] they will reach out to schedule.

## 2017-08-01 NOTE — Telephone Encounter (Signed)
FYI this patient has had 4 consecutive no shows.

## 2017-08-01 NOTE — Telephone Encounter (Signed)
Dr. Terrace ArabiaYan is aware of no show history.  One additional no show at our office will result in dismissal per our practice policy.  New orders for his MRI have been placed in Epic.

## 2017-08-01 NOTE — Telephone Encounter (Signed)
No show (follow up) - called at 10 to cancel 10:30 appt

## 2017-08-01 NOTE — Telephone Encounter (Signed)
Pt's mother called and is wanting to schedule MRI for her son. She has called [facility] and as advised Dr Terrace ArabiaYan needs to send an order. The pt has 4 no shows, I advised her of this numerous times and did not know if the provider would continue to see the pt. Please call to advise

## 2017-08-03 NOTE — Telephone Encounter (Signed)
BCBS Auth: [id number] (exp. 08/03/17 to 09/01/17) patient is scheduled at [facility] for 08/09/17.

## 2017-08-09 ENCOUNTER — Ambulatory Visit
Admission: RE | Admit: 2017-08-09 | Discharge: 2017-08-09 | Disposition: A | Discharge status: Home / Self Care | Payer: BLUE CROSS/BLUE SHIELD | Source: Ambulatory Visit | Attending: Neurology | Admitting: Neurology

## 2017-08-09 DIAGNOSIS — G40909 Epilepsy, unspecified, not intractable, without status epilepticus: Secondary | ICD-10-CM

## 2017-08-09 MED ORDER — GADOBENATE DIMEGLUMINE 529 MG/ML IV SOLN
13.0000 mL | Freq: Once | INTRAVENOUS | Status: AC | PRN
  Administered 2017-08-09: 13 mL via INTRAVENOUS

## 2017-08-11 ENCOUNTER — Telehealth: Payer: Self-pay | Admitting: *Deleted

## 2017-08-11 NOTE — Telephone Encounter (Signed)
-----   Message from Levert FeinsteinYijun Yan, MD sent at 08/11/2017  5:03 PM EDT ----- Please call pt for normal MRI brain.

## 2017-08-12 NOTE — Telephone Encounter (Signed)
Left patient a detailed message, with results, on voicemail (ok per DPR).  Provided our number to call back with any questions.  

## 2017-08-22 ENCOUNTER — Other Ambulatory Visit: Payer: Medicaid Other

## 2017-08-31 ENCOUNTER — Encounter: Payer: Self-pay | Admitting: Neurology

## 2017-08-31 ENCOUNTER — Telehealth: Payer: Self-pay | Admitting: Neurology

## 2017-08-31 NOTE — Telephone Encounter (Addendum)
Patient was seen one time on 12/21/16 with the instructions to follow up in three months after completing MRI and EEG.  The patient only completed the MRI.   He no showed or canceled his appts for the EEG.  He has no showed or canceled his follow up appts.  There is currently no follow up pending.  Records indicate four no shows and three cancellations since he was seen in 12/2016 (one cancellation made by our office).  He has not followed through with his medical plan.  Please dismiss per office protocol.

## 2017-08-31 NOTE — Telephone Encounter (Signed)
FYI- [facility]

## 2017-09-05 ENCOUNTER — Other Ambulatory Visit: Payer: Medicaid Other

## 2017-09-06 ENCOUNTER — Encounter: Payer: Self-pay | Admitting: Neurology

## 2017-11-06 ENCOUNTER — Emergency Department (HOSPITAL_COMMUNITY): Payer: BLUE CROSS/BLUE SHIELD

## 2017-11-06 ENCOUNTER — Encounter (HOSPITAL_COMMUNITY): Payer: Self-pay | Admitting: Emergency Medicine

## 2017-11-06 ENCOUNTER — Emergency Department (HOSPITAL_COMMUNITY)
Admission: EM | Admit: 2017-11-06 | Discharge: 2017-11-06 | Disposition: A | Discharge status: Home / Self Care | Payer: BLUE CROSS/BLUE SHIELD | Attending: Emergency Medicine | Admitting: Emergency Medicine

## 2017-11-06 ENCOUNTER — Other Ambulatory Visit: Payer: Self-pay

## 2017-11-06 DIAGNOSIS — Y999 Unspecified external cause status: Secondary | ICD-10-CM | POA: Insufficient documentation

## 2017-11-06 DIAGNOSIS — Z79899 Other long term (current) drug therapy: Secondary | ICD-10-CM | POA: Diagnosis not present

## 2017-11-06 DIAGNOSIS — H538 Other visual disturbances: Secondary | ICD-10-CM | POA: Insufficient documentation

## 2017-11-06 DIAGNOSIS — Y929 Unspecified place or not applicable: Secondary | ICD-10-CM | POA: Insufficient documentation

## 2017-11-06 DIAGNOSIS — M542 Cervicalgia: Secondary | ICD-10-CM | POA: Diagnosis present

## 2017-11-06 DIAGNOSIS — S62312A Displaced fracture of base of third metacarpal bone, right hand, initial encounter for closed fracture: Secondary | ICD-10-CM | POA: Insufficient documentation

## 2017-11-06 DIAGNOSIS — Y939 Activity, unspecified: Secondary | ICD-10-CM | POA: Insufficient documentation

## 2017-11-06 DIAGNOSIS — S7002XA Contusion of left hip, initial encounter: Secondary | ICD-10-CM | POA: Insufficient documentation

## 2017-11-06 MED ORDER — ONDANSETRON HCL 4 MG PO TABS: 4.0000 mg | ORAL_TABLET | Freq: Four times a day (QID) | ORAL | 0 refills | Status: DC

## 2017-11-06 MED ORDER — HYDROCODONE-ACETAMINOPHEN 5-325 MG PO TABS
1.0000 | ORAL_TABLET | Freq: Once | ORAL | Status: AC
  Administered 2017-11-06: 1 via ORAL
  Filled 2017-11-06: qty 1

## 2017-11-06 MED ORDER — HYDROCODONE-ACETAMINOPHEN 5-325 MG PO TABS: 1.0000 | ORAL_TABLET | Freq: Four times a day (QID) | ORAL | 0 refills | Status: AC | PRN

## 2017-11-06 MED ORDER — IBUPROFEN 400 MG PO TABS
600.0000 mg | ORAL_TABLET | Freq: Once | ORAL | Status: AC
  Administered 2017-11-06: 600 mg via ORAL
  Filled 2017-11-06: qty 1
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
## 2017-11-06 NOTE — Progress Notes (Signed)
Orthopedic Tech Progress Note Patient Details:  Stephen PullingYassir [name] Aug 02, 1995 [id number]  Ortho Devices Type of Ortho Device: Arm sling, Rad Gutter splint Ortho Device/Splint Location: rue Ortho Device/Splint Interventions: Ordered, Application, Adjustment   Post Interventions Patient Tolerated: Well Instructions Provided: Care of device, Adjustment of device   [name], [name] 11/06/2017, 10:47 PM

## 2017-11-06 NOTE — ED Notes (Signed)
Pt reports vision change with "seeing lines" EDP aware
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
## 2017-11-15 DIAGNOSIS — Z0271 Encounter for disability determination: Secondary | ICD-10-CM

## 2017-12-07 DIAGNOSIS — Z0271 Encounter for disability determination: Secondary | ICD-10-CM

## 2018-02-23 ENCOUNTER — Other Ambulatory Visit: Payer: Self-pay | Admitting: Neurology

## 2018-10-22 ENCOUNTER — Emergency Department (HOSPITAL_COMMUNITY)
Admission: EM | Admit: 2018-10-22 | Discharge: 2018-10-22 | Disposition: A | Discharge status: Home / Self Care | Payer: BLUE CROSS/BLUE SHIELD | Attending: Emergency Medicine | Admitting: Emergency Medicine

## 2018-10-22 ENCOUNTER — Other Ambulatory Visit: Payer: Self-pay

## 2018-10-22 ENCOUNTER — Emergency Department (HOSPITAL_COMMUNITY): Payer: BLUE CROSS/BLUE SHIELD

## 2018-10-22 ENCOUNTER — Encounter (HOSPITAL_COMMUNITY): Payer: Self-pay | Admitting: *Deleted

## 2018-10-22 DIAGNOSIS — R569 Unspecified convulsions: Secondary | ICD-10-CM | POA: Insufficient documentation

## 2018-10-22 HISTORY — DX: Depression, unspecified: F32.A

## 2018-10-22 LAB — CBC WITH DIFFERENTIAL/PLATELET
Abs Immature Granulocytes: 0.03 10*3/uL (ref 0.00–0.07)
Basophils Absolute: 0 10*3/uL (ref 0.0–0.1)
Basophils Relative: 1 %
Eosinophils Absolute: 0.1 10*3/uL (ref 0.0–0.5)
Eosinophils Relative: 2 %
HCT: 39.6 % (ref 39.0–52.0)
Hemoglobin: 13.3 g/dL (ref 13.0–17.0)
Immature Granulocytes: 1 %
Lymphocytes Relative: 28 %
Lymphs Abs: 1.6 10*3/uL (ref 0.7–4.0)
MCH: 30.1 pg (ref 26.0–34.0)
MCHC: 33.6 g/dL (ref 30.0–36.0)
MCV: 89.6 fL (ref 80.0–100.0)
Monocytes Absolute: 0.4 10*3/uL (ref 0.1–1.0)
Monocytes Relative: 8 %
Neutro Abs: 3.5 10*3/uL (ref 1.7–7.7)
Neutrophils Relative %: 60 %
Platelets: 262 10*3/uL (ref 150–400)
RBC: 4.42 MIL/uL (ref 4.22–5.81)
RDW: 12.2 % (ref 11.5–15.5)
WBC: 5.6 10*3/uL (ref 4.0–10.5)
nRBC: 0 % (ref 0.0–0.2)

## 2018-10-22 LAB — URINALYSIS, ROUTINE W REFLEX MICROSCOPIC
Bilirubin Urine: NEGATIVE
Glucose, UA: NEGATIVE mg/dL
Hgb urine dipstick: NEGATIVE
Ketones, ur: NEGATIVE mg/dL
Leukocytes,Ua: NEGATIVE
Nitrite: NEGATIVE
Protein, ur: NEGATIVE mg/dL
Specific Gravity, Urine: 1.015 (ref 1.005–1.030)
pH: 7 (ref 5.0–8.0)

## 2018-10-22 LAB — BASIC METABOLIC PANEL
Anion gap: 11 (ref 5–15)
BUN: 12 mg/dL (ref 6–20)
CO2: 26 mmol/L (ref 22–32)
Calcium: 9.1 mg/dL (ref 8.9–10.3)
Chloride: 103 mmol/L (ref 98–111)
Creatinine, Ser: 0.85 mg/dL (ref 0.61–1.24)
GFR calc Af Amer: >60 mL/min (ref >60)
GFR calc non Af Amer: >60 mL/min (ref >60)
Glucose, Bld: 97 mg/dL (ref 70–99)
Potassium: 3.5 mmol/L (ref 3.5–5.1)
Sodium: 140 mmol/L (ref 135–145)

## 2018-10-22 MED ORDER — SODIUM CHLORIDE 0.9 % IV BOLUS
1000.0000 mL | Freq: Once | INTRAVENOUS | Status: AC
  Administered 2018-10-22: 1000 mL via INTRAVENOUS

## 2018-10-22 MED ORDER — LEVETIRACETAM IN NACL 1000 MG/100ML IV SOLN
1000.0000 mg | Freq: Once | INTRAVENOUS | Status: AC
  Administered 2018-10-22: 1000 mg via INTRAVENOUS
  Filled 2018-10-22: qty 100

## 2018-10-22 MED ORDER — SODIUM CHLORIDE 0.9 % IV SOLN
INTRAVENOUS | Status: DC
  Administered 2018-10-22: 04:00:00 via INTRAVENOUS

## 2018-10-22 NOTE — ED Notes (Signed)
Patient aware that we need urine sample for testing, unable at this time. Pt given instruction on providing urine sample when able to do so.   

## 2018-10-22 NOTE — Discharge Instructions (Addendum)
1. Medications: usual home medications - including Keppra 2. Treatment: rest, drink plenty of fluids 3. Follow Up: Please followup with your primary doctor in 3-5 days as needed for continued management of your seizures or for other concerns please return to the ER for return of seizures, high fevers, vomiting or other concerns.

## 2018-10-22 NOTE — ED Provider Notes (Signed)
[employer] DEPT Provider Note   CSN: [id number] Arrival date & time: 10/22/18  0256     History   Chief Complaint Chief Complaint  Patient presents with  . Seizures    HPI Stephen [name] is a 23 y.o. male with a hx of anxiety, depression, seizures presents to the Emergency Department after mother witnessed grand mal seizure and contacted EMS.  Seizure occurred just prior to arrival.  Patient reports that he accidentally missed his Keppra dose tonight.  He reports he has not missed any other doses.  Denies recent illness, fevers or chills.  No known sick contacts.  Per EMS, mother states patient hit his head on the wall and upon their arrival he did complain of some neck pain.  They placed a cervical collar.  Patient alert and oriented with mild tachycardia but otherwise normal vital signs with EMS.  Patient denies drug or alcohol usage.  No headache, vision changes, chest pain, shortness of breath, abdominal pain, nausea, vomiting, diarrhea, weakness, dizziness, syncope.  Patient does report he was vaping watermelon flavored vape tonight but no other changes.     The history is provided by the patient and medical records. No language interpreter was used.    Past Medical History:  Diagnosis Date  . 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 Depression   . Seizures [facility])     Patient Active Problem List   Diagnosis Date Noted  . Depression 12/21/2016  . Facial rash 10/26/2016  . Lethargy 10/26/2016  . Abnormal TSH 10/26/2016  . Mood changes 10/15/2016  . MDD (major depressive disorder), recurrent severe, without psychosis ([place]) 01/19/2016  . Outbursts of anger 12/31/2015  . Encounter for long-term (current) use of high-risk medication 12/23/2014  . ODD (oppositional defiant disorder) 11/12/2014  . 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 MDD (major depressive disorder), recurrent episode, moderate ([place]) 11/12/2014   . 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 Seizure disorder ([place]) 08/31/2012    Past Surgical History:  Procedure Laterality Date  . CIRCUMCISION          Home Medications    Prior to Admission medications   Medication Sig Start Date End Date Taking? Authorizing Provider  acetaminophen (TYLENOL) 325 MG tablet Take 650 mg by mouth every 6 (six) hours as needed for moderate pain.   Yes [provider]  ibuprofen (ADVIL) 200 MG tablet Take 400 mg by mouth every 6 (six) hours as needed for moderate pain.   Yes [provider]  lamoTRIgine (LAMICTAL) 100 MG tablet Take 1 tablet (100 mg total) 2 (two) times daily by mouth. Patient not taking: Reported on 10/22/2018 12/21/16   Marcial Pacas, MD  lamoTRIgine (LAMICTAL) 25 MG tablet 1 tablet twice a day for the first week 2 tablets twice a day for the second week 3 tablets twice a day for the third week 4 tablets twice a day for the fourth week  For total of 140 tablets  After finish titration with small dose of lamotrigine 25 mg, change to lamotrigine 100 mg twice a day Patient not taking: Reported on 10/22/2018 12/21/16   Marcial Pacas, MD  ondansetron (ZOFRAN) 4 MG tablet Take 1 tablet (4 mg total) by mouth every 6 (six) hours. Patient not taking: Reported on 10/22/2018 11/06/17   Romie Jumper, [name]    Family History Family History  Problem Relation Age of Onset  . Diabetes gravidarum Paternal Grandmother   . Heart  attack Paternal Grandmother   . Healthy Mother   . Healthy Father     Social History Social History   Tobacco Use  . Smoking status: Never Smoker  . Smokeless tobacco: Never Used  Substance Use Topics  . Alcohol use: No  . Drug use: No     Allergies   Lactose intolerance (gi)   Review of Systems Review of Systems  Constitutional: Negative for appetite change, diaphoresis, fatigue, fever and unexpected weight change.  HENT: Negative for mouth sores.   Eyes: Negative for visual  disturbance.  Respiratory: Negative for cough, chest tightness, shortness of breath and wheezing.   Cardiovascular: Negative for chest pain.  Gastrointestinal: Negative for abdominal pain, constipation, diarrhea, nausea and vomiting.  Endocrine: Negative for polydipsia, polyphagia and polyuria.  Genitourinary: Negative for dysuria, frequency, hematuria and urgency.  Musculoskeletal: Negative for back pain and neck stiffness.  Skin: Negative for rash.  Allergic/Immunologic: Negative for immunocompromised state.  Neurological: Positive for seizures. Negative for syncope, light-headedness and headaches.  Hematological: Does not bruise/bleed easily.  Psychiatric/Behavioral: Negative for sleep disturbance. The patient is not nervous/anxious.      Physical Exam Updated Vital Signs BP (!) 146/94 (BP Location: Right Arm)   Pulse (!) 111   Temp 99.8 F (37.7 C) (Oral)   Resp 16   Ht 5\' 9"  (1.753 m)   Wt 68 kg   SpO2 100%   BMI 22.15 kg/m   Physical Exam Vitals signs and nursing note reviewed.  Constitutional:      General: He is not in acute distress.    Appearance: He is well-developed. He is not diaphoretic.  HENT:     Head: Normocephalic and atraumatic.  Eyes:     General: No scleral icterus.    Conjunctiva/sclera: Conjunctivae normal.     Pupils: Pupils are equal, round, and reactive to light.     Comments: No horizontal, vertical or rotational nystagmus  Neck:     Musculoskeletal: Normal range of motion and neck supple.     Comments: Patient arrives with c-collar in place.  Patient reports no pain at this time.  C-collar removed.  Full active and passive ROM without pain. No midline or paraspinal tenderness. No nuchal rigidity or meningeal signs Cardiovascular:     Rate and Rhythm: Regular rhythm. Tachycardia present.  Pulmonary:     Effort: Pulmonary effort is normal. No respiratory distress.  Abdominal:     General: Bowel sounds are normal.     Palpations: Abdomen is  soft.     Tenderness: There is no abdominal tenderness. There is no guarding or rebound.  Musculoskeletal: Normal range of motion.  Lymphadenopathy:     Cervical: No cervical adenopathy.  Skin:    General: Skin is warm and dry.     Findings: No rash.  Neurological:     Mental Status: He is alert and oriented to person, place, and time.     Cranial Nerves: No cranial nerve deficit.     Motor: No abnormal muscle tone.     Coordination: Coordination normal.     Comments: Mental Status:  Alert, oriented, thought content appropriate. Speech fluent without evidence of aphasia. Able to follow 2 step commands without difficulty.  Cranial Nerves:  II:  Peripheral visual fields grossly normal, pupils equal, round, reactive to light III,IV, VI: ptosis not present, extra-ocular motions intact bilaterally  V,VII: smile symmetric, facial light touch sensation equal VIII: hearing grossly normal bilaterally  IX,X: midline uvula rise  XI:  bilateral shoulder shrug equal and strong XII: midline tongue extension  Motor:  5/5 in upper and lower extremities bilaterally including strong and equal grip strength and dorsiflexion/plantar flexion Sensory: Pinprick and light touch normal in all extremities.  Cerebellar: normal finger-to-nose with bilateral upper extremities Gait: normal gait and balance CV: distal pulses palpable throughout   Psychiatric:        Behavior: Behavior normal.        Thought Content: Thought content normal.        Judgment: Judgment normal.      ED Treatments / Results  Labs (all labs ordered are listed, but only abnormal results are displayed) Labs Reviewed  BASIC METABOLIC PANEL  CBC WITH DIFFERENTIAL/PLATELET  URINALYSIS, ROUTINE W REFLEX MICROSCOPIC  CBG MONITORING, ED    EKG None  Radiology Dg Cervical Spine Complete  Result Date: 10/22/2018 CLINICAL DATA:  Fall, seizure EXAM: CERVICAL SPINE - COMPLETE 4+ VIEW COMPARISON:  11/06/2017 FINDINGS: There is no  evidence of cervical spine fracture or prevertebral soft tissue swelling. Alignment is normal. No other significant bone abnormalities are identified. IMPRESSION: Negative cervical spine radiographs. Electronically Signed   By: Charlett NoseKevin  Dover M.D.   On: 10/22/2018 03:54    Procedures Procedures (including critical care time)  Medications Ordered in ED Medications  sodium chloride 0.9 % bolus 1,000 mL (0 mLs Intravenous Stopped 10/22/18 0503)    And  0.9 %  sodium chloride infusion ( Intravenous New Bag/Given 10/22/18 0349)  levETIRAcetam (KEPPRA) IVPB 1000 mg/100 mL premix (0 mg Intravenous Stopped 10/22/18 0403)     Initial Impression / Assessment and Plan / ED Course  I have reviewed the triage vital signs and the nursing notes.  Pertinent labs & imaging results that were available during my care of the patient were reviewed by me and considered in my medical decision making (see chart for details).         Presents after witnessed seizure at home.  He did miss his Keppra tonight.  Will give Keppra load, check basic labs and reassess.  Patient arrives with spinal restriction and c-collar in place.  No midline or paraspinal tenderness.  C-collar removed without pain on range of motion.  Plain films without acute process or change in alignment.  5:05 AM Patient remains alert and oriented here in the emergency department.  He has been able to tolerate p.o. without difficulty.  No additional seizures.  Keppra load given.  Labs reassuring.  No evidence of infection or sepsis.  Discussed the importance of remembering to take his medications on time.  Also discussed close primary care follow-up and reasons to return to the emergency department.  Patient and mother state understanding and are in agreement with the plan.   Final Clinical Impressions(s) / ED Diagnoses   Final diagnoses:  Seizure [facility](HCC)    ED Discharge Orders    None       Brion Hedges, Boyd KerbsHannah, [name] 10/22/18 [id number]     Derwood KaplanNanavati, Ankit, MD 10/22/18 [phone]

## 2018-10-22 NOTE — ED Notes (Signed)
Patient transported to X-ray 

## 2018-10-22 NOTE — ED Triage Notes (Signed)
Pt says that he takes kepra for seizures. Pt does c/o frontal head pain, no nausea or vomiting. No obvious head trauma noted. C collar removed by provider after exam.

## 2018-10-22 NOTE — ED Triage Notes (Signed)
Pt arrives with seizure from home tonight via EMS. Pt has hx of seizure. Pt mother witnessed the seizure, stated lasted about 3-5 minutes, fell and hit head against the wall in the bathroom, c/o neck pain (collar applied PTA). Pt is compliant with his meds, unable to take it today. Last seizure >1 year ago. Pt was alert and oriented on EMS arrival to the scene. 137/88, hr 120-140, cbg 112. IV established in the left forearm.

## 2018-12-04 ENCOUNTER — Telehealth: Payer: Self-pay | Admitting: Neurology

## 2018-12-04 ENCOUNTER — Other Ambulatory Visit: Payer: Self-pay

## 2018-12-04 ENCOUNTER — Encounter (HOSPITAL_COMMUNITY): Payer: Self-pay

## 2018-12-04 ENCOUNTER — Emergency Department (HOSPITAL_COMMUNITY)
Admission: EM | Admit: 2018-12-04 | Discharge: 2018-12-04 | Disposition: A | Discharge status: Home / Self Care | Payer: BLUE CROSS/BLUE SHIELD | Attending: Emergency Medicine | Admitting: Emergency Medicine

## 2018-12-04 DIAGNOSIS — Z760 Encounter for issue of repeat prescription: Secondary | ICD-10-CM | POA: Diagnosis present

## 2018-12-04 MED ORDER — LAMOTRIGINE 100 MG PO TABS: 100.0000 mg | ORAL_TABLET | Freq: Two times a day (BID) | ORAL | 2 refills | Status: DC

## 2018-12-04 NOTE — Telephone Encounter (Signed)
Pt called stating that he is needing a refill on his seizure medications. Pt was informed that they have been discharged and they stated that the new Neurologist will not fill for him since they have not seen him and he states that they apparently cant see him until Jan. Please advise.

## 2018-12-04 NOTE — ED Triage Notes (Signed)
Pt requesting refill of his seizure medication (Lamotrigine). Pt brought his bottle that has 1/3 full but states it is expired 3 days ago. Last seizure was 2 weeks ago. Pt has appointment with neurologist in January, pt sent here for refill.

## 2018-12-04 NOTE — Telephone Encounter (Addendum)
I called and spent quite a bit of time trying to explain to father that pt has been dismissed from our office over year ago. He states they cannot go to current PCP d/t being out of network and  new neurology appt not until 02/2019. I tried recommending he try and call to see if they can work him in urgently for appt if he is almost out of medications. I recommended they contact insurance to see who is in network and he can get established with new PCP in network. Father kept repeating that he has appt in January and needs meds now. States his life is "in danger". I recommended they go to urgent care/ED if he needs to be evaluated asap and they can refill medications until he can get established with new PCP. Father continued to be argumentative. He was requesting multiple times that we refill medications. He tried putting daughter on the phone but she was not on DPR form. Father kept trying to argue with me and I stated that at this time I have given him options and I was going to end the call and ended the call.

## 2018-12-04 NOTE — Discharge Instructions (Signed)
Take the medications as prescribed.  Keep your follow-up appointment with neurology.

## 2018-12-04 NOTE — ED Provider Notes (Signed)
[facility] EMERGENCY DEPARTMENT Provider Note   CSN: [id number] Arrival date & time: 12/04/18  1805    History   Chief Complaint Chief Complaint  Patient presents with  . Medication Refill    HPI Stephen [name] is a 23 y.o. male with past medical history significant for seizures, depression, anxiety who presents for evaluation of medication refill.  Patient states he is not currently being followed by neurology.  He does have an appointment to establish care with neurology at [facility] January 2020.  Patient was previously seen by [facility] neurologic however was dismissed from their practice months ago.  They were not able to refill his medications due to being dismissed from the practice.  He has been compliant with his medications thus far.  He does have records of a bottle of his Lamictal which she is currently on.  His last seizure was on 9 6/20 where he was seen in the emergency department.  He continues to do well on his current dose of Lamictal, 100 mg twice daily.  Denies fever, chills, nausea, vomiting, headache, blurred vision, dyspnea, shortness of breath, lateral weakness.  Denies additional aggravating or alleviating factors.  History obtained from patient, father in room, past medical records.  No interpreter is used.     HPI  Past Medical History:  Diagnosis Date  . 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 Depression   . Seizures [facility])     Patient Active Problem List   Diagnosis Date Noted  . Depression 12/21/2016  . Facial rash 10/26/2016  . Lethargy 10/26/2016  . Abnormal TSH 10/26/2016  . Mood changes 10/15/2016  . 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 Outbursts of anger 12/31/2015  . Encounter for long-term (current) use of high-risk medication 12/23/2014  . ODD (oppositional defiant disorder) 11/12/2014  . Anxiety disorder of childhood or adolescence  11/12/2014  . MDD (major depressive disorder), recurrent episode, moderate (HCC) 11/12/2014  . 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 Seizure disorder (HCC) 08/31/2012    Past Surgical History:  Procedure Laterality Date  . CIRCUMCISION          Home Medications    Prior to Admission medications   Medication Sig Start Date End Date Taking? Authorizing Provider  acetaminophen (TYLENOL) 325 MG tablet Take 650 mg by mouth every 6 (six) hours as needed for moderate pain.    [provider]  ibuprofen (ADVIL) 200 MG tablet Take 400 mg by mouth every 6 (six) hours as needed for moderate pain.    [provider]  lamoTRIgine (LAMICTAL) 100 MG tablet Take 1 tablet (100 mg total) by mouth 2 (two) times daily. 12/04/18   Henderly, Britni A, [name]  ondansetron (ZOFRAN) 4 MG tablet Take 1 tablet (4 mg total) by mouth every 6 (six) hours. Patient not taking: Reported on 10/22/2018 11/06/17   Windy Carina, [name]    Family History Family History  Problem Relation Age of Onset  . Diabetes gravidarum Paternal Grandmother   . Heart attack Paternal Grandmother   . Healthy Mother   . Healthy Father     Social History Social History   Tobacco Use  . Smoking status: Never Smoker  . Smokeless tobacco: Never Used  Substance Use Topics  . Alcohol use: No  . Drug use: No    Allergies   Lactose intolerance (gi)  Review of Systems Review of Systems  Constitutional: Negative.  HENT: Negative.   Eyes: Negative.   Respiratory: Negative.   Cardiovascular: Negative.   Gastrointestinal: Negative.   Genitourinary: Negative.   Musculoskeletal: Negative.   Skin: Negative.   Neurological: Negative.   All other systems reviewed and are negative.  Physical Exam Updated Vital Signs BP 108/78 (BP Location: Right Arm)   Pulse 70   Temp 98.5 F (36.9 C) (Oral)   Resp 14   Ht 5\' 9"  (1.753 m)   Wt 68 kg   SpO2 100%   BMI 22.15 kg/m   Physical Exam Vitals  signs and nursing note reviewed.  Constitutional:      General: He is not in acute distress.    Appearance: He is well-developed. He is not ill-appearing, toxic-appearing or diaphoretic.  HENT:     Head: Normocephalic and atraumatic.     Mouth/Throat:     Mouth: Mucous membranes are moist.     Pharynx: Oropharynx is clear.  Eyes:     Pupils: Pupils are equal, round, and reactive to light.  Neck:     Musculoskeletal: Normal range of motion and neck supple.  Cardiovascular:     Rate and Rhythm: Normal rate and regular rhythm.     Pulses: Normal pulses.     Heart sounds: Normal heart sounds.  Pulmonary:     Effort: Pulmonary effort is normal. No respiratory distress.     Breath sounds: Normal breath sounds.  Abdominal:     General: Bowel sounds are normal. There is no distension.     Palpations: Abdomen is soft.  Musculoskeletal: Normal range of motion.     Comments: Moves all 4 extremities without difficulty.  Skin:    General: Skin is warm and dry.     Capillary Refill: Capillary refill takes less than 2 seconds.     Comments: Brisk cap refill.   Neurological:     General: No focal deficit present.     Mental Status: He is alert and oriented to person, place, and time.     Comments: Ambulatory without difficulty.    ED Treatments / Results  Labs (all labs ordered are listed, but only abnormal results are displayed) Labs Reviewed - No data to display  EKG None  Radiology No results found.  Procedures Procedures (including critical care time)  Medications Ordered in ED Medications - No data to display  Initial Impression / Assessment and Plan / ED Course  I have reviewed the triage vital signs and the nursing notes.  Pertinent labs & imaging results that were available during my care of the patient were reviewed by me and considered in my medical decision making (see chart for details).  23 year old male presents with father for evaluation of medication refill.   He was dismissed from [facility] neurology and has not had follow for medication refills.  He does have appointment to establish care with [facility].  I have reviewed this and his appointment in January through epic.  He has not had a seizure since he was last seen in the emergency department in September.  He has no complaints.  Will DC home with prescription for his Lamictal which patient and father states he has been doing well on.  The patient has been appropriately medically screened and/or stabilized in the ED. I have low suspicion for any other emergent medical condition which would require further screening, evaluation or treatment in the ED or require inpatient management.       Final Clinical  Impressions(s) / ED Diagnoses   Final diagnoses:  Medication refill    ED Discharge Orders         Ordered    lamoTRIgine (LAMICTAL) 100 MG tablet  2 times daily     12/04/18 1837           Henderly, Britni A, [name] 12/04/18 1847    Raeford RazorKohut, Stephen, MD 12/05/18 1523

## 2018-12-04 NOTE — ED Notes (Signed)
Pt and family verbalized understanding of discharge paperwork and follow-up care.  °

## 2018-12-07 ENCOUNTER — Ambulatory Visit (HOSPITAL_COMMUNITY)
Admission: EM | Admit: 2018-12-07 | Discharge: 2018-12-08 | Disposition: A | Discharge status: Home / Self Care | Payer: BLUE CROSS/BLUE SHIELD | Attending: Emergency Medicine | Admitting: Emergency Medicine

## 2018-12-07 ENCOUNTER — Other Ambulatory Visit: Payer: Self-pay

## 2018-12-07 ENCOUNTER — Emergency Department (HOSPITAL_COMMUNITY): Payer: BLUE CROSS/BLUE SHIELD

## 2018-12-07 DIAGNOSIS — R569 Unspecified convulsions: Secondary | ICD-10-CM

## 2018-12-07 DIAGNOSIS — G40909 Epilepsy, unspecified, not intractable, without status epilepticus: Secondary | ICD-10-CM | POA: Insufficient documentation

## 2018-12-07 DIAGNOSIS — W1830XA Fall on same level, unspecified, initial encounter: Secondary | ICD-10-CM | POA: Insufficient documentation

## 2018-12-07 DIAGNOSIS — F419 Anxiety disorder, unspecified: Secondary | ICD-10-CM | POA: Insufficient documentation

## 2018-12-07 DIAGNOSIS — S43014A Anterior dislocation of right humerus, initial encounter: Secondary | ICD-10-CM | POA: Insufficient documentation

## 2018-12-07 DIAGNOSIS — Z791 Long term (current) use of non-steroidal anti-inflammatories (NSAID): Secondary | ICD-10-CM | POA: Diagnosis not present

## 2018-12-07 DIAGNOSIS — W19XXXA Unspecified fall, initial encounter: Secondary | ICD-10-CM

## 2018-12-07 DIAGNOSIS — Z79899 Other long term (current) drug therapy: Secondary | ICD-10-CM | POA: Diagnosis not present

## 2018-12-07 DIAGNOSIS — Z20828 Contact with and (suspected) exposure to other viral communicable diseases: Secondary | ICD-10-CM | POA: Diagnosis not present

## 2018-12-07 DIAGNOSIS — F329 Major depressive disorder, single episode, unspecified: Secondary | ICD-10-CM | POA: Insufficient documentation

## 2018-12-07 DIAGNOSIS — Z419 Encounter for procedure for purposes other than remedying health state, unspecified: Secondary | ICD-10-CM

## 2018-12-07 LAB — BASIC METABOLIC PANEL
Anion gap: 9 (ref 5–15)
BUN: 7 mg/dL (ref 6–20)
CO2: 19 mmol/L — ABNORMAL LOW (ref 22–32)
Calcium: 9 mg/dL (ref 8.9–10.3)
Chloride: 113 mmol/L — ABNORMAL HIGH (ref 98–111)
Creatinine, Ser: 0.67 mg/dL (ref 0.61–1.24)
GFR calc Af Amer: >60 mL/min (ref >60)
GFR calc non Af Amer: >60 mL/min (ref >60)
Glucose, Bld: 104 mg/dL — ABNORMAL HIGH (ref 70–99)
Potassium: 3.5 mmol/L (ref 3.5–5.1)
Sodium: 141 mmol/L (ref 135–145)

## 2018-12-07 LAB — CBC
HCT: 39.9 % (ref 39.0–52.0)
Hemoglobin: 12.8 g/dL — ABNORMAL LOW (ref 13.0–17.0)
MCH: 27.2 pg (ref 26.0–34.0)
MCHC: 32.1 g/dL (ref 30.0–36.0)
MCV: 84.9 fL (ref 80.0–100.0)
Platelets: 218 10*3/uL (ref 150–400)
RBC: 4.7 MIL/uL (ref 4.22–5.81)
RDW: 13.2 % (ref 11.5–15.5)
WBC: 8.2 10*3/uL (ref 4.0–10.5)
nRBC: 0 % (ref 0.0–0.2)

## 2018-12-07 MED ORDER — FENTANYL CITRATE (PF) 100 MCG/2ML IJ SOLN
50.0000 ug | Freq: Once | INTRAMUSCULAR | Status: AC
  Administered 2018-12-08: 50 ug via INTRAVENOUS
  Filled 2018-12-07: qty 2

## 2018-12-07 MED ORDER — SODIUM CHLORIDE 0.9 % IV BOLUS (SEPSIS)
1000.0000 mL | Freq: Once | INTRAVENOUS | Status: AC
  Administered 2018-12-08: 1000 mL via INTRAVENOUS

## 2018-12-07 MED ORDER — ONDANSETRON HCL 4 MG/2ML IJ SOLN
4.0000 mg | Freq: Once | INTRAMUSCULAR | Status: DC
  Filled 2018-12-07: qty 2

## 2018-12-07 MED ORDER — PROPOFOL 10 MG/ML IV BOLUS
1.0000 mg/kg | Freq: Once | INTRAVENOUS | Status: DC
  Filled 2018-12-07: qty 20

## 2018-12-07 NOTE — ED Provider Notes (Addendum)
TIME SEEN: 11:05 PM  CHIEF COMPLAINT: Seizure  HPI: Patient is a 23 year old male with history of seizures who presents to the emergency department after he had a seizure today that was unwitnessed.  Family reports he was in the bathroom when this happened.  Complains of right shoulder pain.  He is right-hand dominant.  States he has been taking his Lamictal as prescribed but has not had his nighttime dose.  No changes in medications.  No fevers, cough, vomiting or diarrhea, loss of taste or smell.  No numbness or weakness.  States last seizure was 3 weeks ago.  He is followed by [facility] neurology.  States due to change in insurance he has an appointment to see neurology at [facility] in January.  ROS: See HPI Constitutional: no fever  Eyes: no drainage  ENT: no runny nose   Cardiovascular:  no chest pain  Resp: no SOB  GI: no vomiting GU: no dysuria Integumentary: no rash  Allergy: no hives  Musculoskeletal: no leg swelling  Neurological: no slurred speech ROS otherwise negative  PAST MEDICAL HISTORY/PAST SURGICAL HISTORY:  Past Medical History:  Diagnosis Date  . 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 Depression   . Seizures (HCC)     MEDICATIONS:  Prior to Admission medications   Medication Sig Start Date End Date Taking? Authorizing Provider  acetaminophen (TYLENOL) 325 MG tablet Take 650 mg by mouth every 6 (six) hours as needed for moderate pain.    [provider]  ibuprofen (ADVIL) 200 MG tablet Take 400 mg by mouth every 6 (six) hours as needed for moderate pain.    [provider]  lamoTRIgine (LAMICTAL) 100 MG tablet Take 1 tablet (100 mg total) by mouth 2 (two) times daily. 12/04/18   Henderly, Britni A, [name]  ondansetron (ZOFRAN) 4 MG tablet Take 1 tablet (4 mg total) by mouth every 6 (six) hours. Patient not taking: Reported on 10/22/2018 11/06/17   Dagoberto Ligas I, [name]    ALLERGIES:  Allergies  Allergen Reactions  . Lactose  Intolerance (Gi) Diarrhea    SOCIAL HISTORY:  Social History   Tobacco Use  . Smoking status: Never Smoker  . Smokeless tobacco: Never Used  Substance Use Topics  . Alcohol use: No    FAMILY HISTORY: Family History  Problem Relation Age of Onset  . Diabetes gravidarum Paternal Grandmother   . Heart attack Paternal Grandmother   . Healthy Mother   . Healthy Father     EXAM: BP (!) 153/90 (BP Location: Left Arm)   Pulse 69   Temp 99.7 F (37.6 C) (Oral)   Resp 16   Ht 5\' 9"  (1.753 m)   Wt 68 kg   SpO2 100%   BMI 22.15 kg/m  CONSTITUTIONAL: Alert and oriented and responds appropriately to questions. Well-appearing; well-nourished; GCS 15 HEAD: Normocephalic; atraumatic EYES: Conjunctivae clear, PERRL, EOMI ENT: normal nose; no rhinorrhea; moist mucous membranes; pharynx without lesions noted; no dental injury; no septal hematoma NECK: Supple, no meningismus, no LAD; no midline spinal tenderness, step-off or deformity; trachea midline CARD: RRR; S1 and S2 appreciated; no murmurs, no clicks, no rubs, no gallops RESP: Normal chest excursion without splinting or tachypnea; breath sounds clear and equal bilaterally; no wheezes, no rhonchi, no rales; no hypoxia or respiratory distress CHEST:  chest wall stable, no crepitus or ecchymosis or deformity, nontender to palpation; no flail chest ABD/GI: Normal bowel sounds; non-distended; soft, non-tender, no rebound, no guarding; no ecchymosis or  other lesions noted PELVIS:  stable, nontender to palpation BACK:  The back appears normal and is non-tender to palpation, there is no CVA tenderness; no midline spinal tenderness, step-off or deformity EXT: Tender to palpation with loss of fullness of the right shoulder without other bony deformity.  2+ right radial pulse on exam.  No joint effusion, compartments are soft, extremities are warm and well-perfused, no ecchymosis SKIN: Normal color for age and race; warm NEURO: Moves all  extremities equally, sensation to light touch intact diffusely, cranial nerves II through XII intact, normal speech PSYCH: The patient's mood and manner are appropriate. Grooming and personal hygiene are appropriate.  MEDICAL DECISION MAKING: Patient here with unwitnessed seizure.  History of seizures followed by [facility] neurology.  On Lamictal 100 mg twice daily.  States he has been on Keppra before but it caused him to have anger issues.  Has right shoulder dislocation.  Patient is n.p.o.  No medication allergies.  Last had anything to eat at 7 PM.  Consented for procedural sedation and reduction at bedside.  ED PROGRESS: Patient sedated with propofol.  Unable to reduce shoulder with multiple techniques and multiple attempts by myself and other attending physician Dr. Christy Gentles.  Will discuss with orthopedics on-call.  2:10 AM  Discussed with Dr. Stann Mainland on-call for orthopedics.  Appreciate his help.  He will take patient to the operating room for sedation and closed reduction.  Would like rapid Covid swab sent.  Will give patient dose of home Lamictal.  Also discussed with neurology on-call.  Labs, urine unremarkable here.  2:30 AM  D/w Dr. Rory Percy with neurology.  Agrees with giving him dose of Lamictal and sending a Lamictal level.  Patient can follow-up with [facility] neurology as an outpatient.  We will not adjust medications at this time per neurology recommendations.  COVID negative.  Patient to OR for reduction.   .Sedation  Date/Time: 12/08/2018 1:30 AM Performed by: Wesson Stith, Delice Bison, DO Authorized by: Turki Tapanes, Delice Bison, DO   Consent:    Consent obtained:  Written   Consent given by:  Patient   Risks discussed:  Allergic reaction, dysrhythmia, inadequate sedation, nausea, prolonged hypoxia resulting in organ damage, prolonged sedation necessitating reversal, respiratory compromise necessitating ventilatory assistance and intubation and vomiting   Alternatives discussed:  Analgesia without  sedation, anxiolysis and regional anesthesia Universal protocol:    Procedure explained and questions answered to patient or proxy's satisfaction: yes     Relevant documents present and verified: yes     Test results available and properly labeled: yes     Imaging studies available: yes     Required blood products, implants, devices, and special equipment available: yes     Site/side marked: yes     Immediately prior to procedure a time out was called: yes     Patient identity confirmation method:  Verbally with patient Indications:    Procedure performed:  Dislocation reduction   Procedure necessitating sedation performed by:  Physician performing sedation Pre-sedation assessment:    Time since last food or drink:  7pm 12/07/2018   ASA classification: class 2 - patient with mild systemic disease     Neck mobility: normal     Mouth opening:  3 or more finger widths   Thyromental distance:  4 finger widths   Mallampati score:  I - soft palate, uvula, fauces, pillars visible   Pre-sedation assessments completed and reviewed: airway patency, cardiovascular function, hydration status, mental status, nausea/vomiting, pain level, respiratory function  and temperature     Pre-sedation assessment completed:  12/08/2018 1:00 AM Immediate pre-procedure details:    Reassessment: Patient reassessed immediately prior to procedure     Reviewed: vital signs, relevant labs/tests and NPO status     Verified: bag valve mask available, emergency equipment available, intubation equipment available, IV patency confirmed, oxygen available and suction available   Procedure details (see MAR for exact dosages):    Preoxygenation:  Nasal cannula   Sedation:  Propofol   Intended level of sedation: deep   Analgesia:  Fentanyl   Intra-procedure monitoring:  Blood pressure monitoring, cardiac monitor, continuous pulse oximetry, frequent LOC assessments, frequent vital sign checks and continuous capnometry    Intra-procedure events: none     Total Provider sedation time (minutes):  15 Post-procedure details:    Post-sedation assessment completed:  12/08/2018 2:30 AM   Attendance: Constant attendance by certified staff until patient recovered     Recovery: Patient returned to pre-procedure baseline     Post-sedation assessments completed and reviewed: airway patency, cardiovascular function, hydration status, mental status, nausea/vomiting, pain level, respiratory function and temperature     Patient is stable for discharge or admission: yes     Patient tolerance:  Tolerated well, no immediate complications Reduction of dislocation  Date/Time: 12/08/2018 4:43 AM Performed by: Sione Baumgarten, Layla MawKristen N, DO Authorized by: Joahan Swatzell, Layla MawKristen N, DO  Consent: Written consent obtained. Risks and benefits: risks, benefits and alternatives were discussed Consent given by: patient Patient understanding: patient states understanding of the procedure being performed Patient consent: the patient's understanding of the procedure matches consent given Procedure consent: procedure consent matches procedure scheduled Relevant documents: relevant documents present and verified Test results: test results available and properly labeled Site marked: the operative site was marked Imaging studies: imaging studies available Required items: required blood products, implants, devices, and special equipment available Patient identity confirmed: verbally with patient Time out: Immediately prior to procedure a "time out" was called to verify the correct patient, procedure, equipment, support staff and site/side marked as required. Local anesthesia used: no  Anesthesia: Local anesthesia used: no  Sedation: Patient sedated: yes Sedation type: moderate (conscious) sedation Sedatives: propofol Analgesia: fentanyl Sedation start date/time: 12/08/2018 1:35 AM Sedation end date/time: 12/08/2018 1:50 AM Vitals: Vital signs were  monitored during sedation.  Patient tolerance: patient tolerated the procedure well with no immediate complications      I reviewed all nursing notes and pertinent previous records as available.  I have interpreted any EKGs, lab and urine results, imaging (as available).   Othelia PullingYassir Bucklew was evaluated in Emergency Department on 12/07/2018 for the symptoms described in the history of present illness. He was evaluated in the context of the global COVID-19 pandemic, which necessitated consideration that the patient might be at risk for infection with the SARS-CoV-2 virus that causes COVID-19. Institutional protocols and algorithms that pertain to the evaluation of patients at risk for COVID-19 are in a state of rapid change based on information released by regulatory bodies including the CDC and federal and state organizations. These policies and algorithms were followed during the patient's care in the ED.     Charmeka Freeburg, Layla MawKristen N, DO 12/08/18 0433    Swayzee Wadley, Layla MawKristen N, DO 12/08/18 [phone]

## 2018-12-07 NOTE — ED Triage Notes (Signed)
Pt states had a seizure earlier and fell and hit his right arm; thinks he broke it.

## 2018-12-08 ENCOUNTER — Emergency Department (HOSPITAL_COMMUNITY): Payer: BLUE CROSS/BLUE SHIELD

## 2018-12-08 ENCOUNTER — Emergency Department (HOSPITAL_COMMUNITY): Payer: BLUE CROSS/BLUE SHIELD | Admitting: Anesthesiology

## 2018-12-08 ENCOUNTER — Encounter (HOSPITAL_COMMUNITY)
Admission: EM | Disposition: A | Discharge status: Home / Self Care | Payer: Self-pay | Source: Home / Self Care | Attending: Emergency Medicine

## 2018-12-08 HISTORY — PX: SHOULDER CLOSED REDUCTION: SHX1051

## 2018-12-08 LAB — URINALYSIS, ROUTINE W REFLEX MICROSCOPIC
Bilirubin Urine: NEGATIVE
Glucose, UA: NEGATIVE mg/dL
Hgb urine dipstick: NEGATIVE
Ketones, ur: NEGATIVE mg/dL
Leukocytes,Ua: NEGATIVE
Nitrite: NEGATIVE
Protein, ur: NEGATIVE mg/dL
Specific Gravity, Urine: 1.005 (ref 1.005–1.030)
pH: 6 (ref 5.0–8.0)

## 2018-12-08 LAB — SARS CORONAVIRUS 2 BY RT PCR (HOSPITAL ORDER, PERFORMED IN ~~LOC~~ HOSPITAL LAB): SARS Coronavirus 2: NEGATIVE

## 2018-12-08 SURGERY — CLOSED REDUCTION, SHOULDER
Anesthesia: General | Site: Shoulder | Laterality: Right

## 2018-12-08 MED ORDER — PROPOFOL 10 MG/ML IV BOLUS
INTRAVENOUS | Status: AC
  Filled 2018-12-08: qty 40

## 2018-12-08 MED ORDER — MORPHINE SULFATE (PF) 4 MG/ML IV SOLN
4.0000 mg | Freq: Once | INTRAVENOUS | Status: AC
  Administered 2018-12-08: 4 mg via INTRAVENOUS
  Filled 2018-12-08: qty 1

## 2018-12-08 MED ORDER — SUCCINYLCHOLINE CHLORIDE 20 MG/ML IJ SOLN
INTRAMUSCULAR | Status: DC | PRN
  Administered 2018-12-08: 120 mg via INTRAVENOUS

## 2018-12-08 MED ORDER — LIDOCAINE HCL (CARDIAC) PF 100 MG/5ML IV SOSY
PREFILLED_SYRINGE | INTRAVENOUS | Status: DC | PRN
  Administered 2018-12-08: 60 mg via INTRATRACHEAL

## 2018-12-08 MED ORDER — ONDANSETRON HCL 4 MG/2ML IJ SOLN: 4.0000 mg | Freq: Once | INTRAMUSCULAR | Status: DC | PRN

## 2018-12-08 MED ORDER — PROPOFOL 10 MG/ML IV BOLUS
INTRAVENOUS | Status: DC | PRN
  Administered 2018-12-08: 200 mg via INTRAVENOUS

## 2018-12-08 MED ORDER — LACTATED RINGERS IV SOLN
INTRAVENOUS | Status: DC | PRN
  Administered 2018-12-08: 04:00:00 via INTRAVENOUS

## 2018-12-08 MED ORDER — MIDAZOLAM HCL 2 MG/2ML IJ SOLN
INTRAMUSCULAR | Status: AC
  Filled 2018-12-08: qty 2

## 2018-12-08 MED ORDER — FENTANYL CITRATE (PF) 100 MCG/2ML IJ SOLN: 25.0000 ug | INTRAMUSCULAR | Status: DC | PRN

## 2018-12-08 MED ORDER — MIDAZOLAM HCL 5 MG/5ML IJ SOLN
INTRAMUSCULAR | Status: DC | PRN
  Administered 2018-12-08: 2 mg via INTRAVENOUS

## 2018-12-08 MED ORDER — FENTANYL CITRATE (PF) 250 MCG/5ML IJ SOLN
INTRAMUSCULAR | Status: AC
  Filled 2018-12-08: qty 5

## 2018-12-08 MED ORDER — PROPOFOL 10 MG/ML IV BOLUS
INTRAVENOUS | Status: AC | PRN
  Administered 2018-12-08: 68 mg via INTRAVENOUS
  Administered 2018-12-08 (×2): 40 mg via INTRAVENOUS

## 2018-12-08 MED ORDER — OXYCODONE HCL 5 MG/5ML PO SOLN: 5.0000 mg | Freq: Once | ORAL | Status: DC | PRN

## 2018-12-08 MED ORDER — OXYCODONE HCL 5 MG PO TABS: 5.0000 mg | ORAL_TABLET | Freq: Once | ORAL | Status: DC | PRN

## 2018-12-08 MED ORDER — LAMOTRIGINE 100 MG PO TABS
100.0000 mg | ORAL_TABLET | Freq: Once | ORAL | Status: DC
  Filled 2018-12-08: qty 1

## 2018-12-08 MED ORDER — FENTANYL CITRATE (PF) 250 MCG/5ML IJ SOLN
INTRAMUSCULAR | Status: DC | PRN
  Administered 2018-12-08: 50 ug via INTRAVENOUS

## 2018-12-08 MED ORDER — HYDROCODONE-ACETAMINOPHEN 5-325 MG PO TABS: 1.0000 | ORAL_TABLET | Freq: Four times a day (QID) | ORAL | 0 refills | Status: DC | PRN

## 2018-12-08 MED ORDER — LAMOTRIGINE 100 MG PO TABS
100.0000 mg | ORAL_TABLET | Freq: Once | ORAL | Status: AC
  Administered 2018-12-08: 100 mg via ORAL
  Filled 2018-12-08: qty 1

## 2018-12-08 SURGICAL SUPPLY — 1 items: SLING ARM IMMOBILIZER MED (SOFTGOODS) ×2 IMPLANT

## 2018-12-08 NOTE — Brief Op Note (Signed)
12/08/2018  4:29 AM  PATIENT:  Corliss Parish  23 y.o. male  PRE-OPERATIVE DIAGNOSIS:  right shoulder dislocation  POST-OPERATIVE DIAGNOSIS:  right shoulder dislocation  PROCEDURE:  Procedure(s): CLOSED REDUCTION SHOULDER (Right)  SURGEON:  Surgeon(s) and Role:    * Nicholes Stairs, MD - Primary  PHYSICIAN ASSISTANT:   ASSISTANTS: none   ANESTHESIA:   general  EBL:  0   BLOOD ADMINISTERED:none  DRAINS: none   LOCAL MEDICATIONS USED:  NONE  SPECIMEN:  No Specimen  DISPOSITION OF SPECIMEN:  N/A  COUNTS:  YES  TOURNIQUET:  * No tourniquets in log *  DICTATION: .Note written in EPIC  PLAN OF CARE: Discharge to home after PACU  PATIENT DISPOSITION:  PACU - hemodynamically stable.   Delay start of Pharmacological VTE agent (>24hrs) due to surgical blood loss or risk of bleeding: not applicable

## 2018-12-08 NOTE — Op Note (Signed)
Date of Surgery: 12/08/2018  INDICATIONS: Mr. Stephen [name] is a 23 y.o.-year-old male with a right closed shoulder dislocation;  The patient did consent to the procedure after discussion of the risks and benefits.  PREOPERATIVE DIAGNOSIS:  1.  Closed right shoulder anterior dislocation  POSTOPERATIVE DIAGNOSIS: Same.  PROCEDURE: Closed reduction of right shoulder with general anesthesia  SURGEON: Geralynn Rile, M.D.  ASSIST: None.  ANESTHESIA:  general  IV FLUIDS AND URINE: See anesthesia.  ESTIMATED BLOOD LOSS: 0 mL.  IMPLANTS: None  DRAINS: None  COMPLICATIONS: None.  DESCRIPTION OF PROCEDURE: The patient was brought to the operating room and placed supine on the operating table.  The patient had been signed prior to the procedure and this was documented. The patient had the anesthesia placed by the anesthesiologist.  A time-out was performed to confirm that this was the correct patient, site, side and location. The patient did not receive antibiotics prior to the procedure given this was a closed maneuver.  A tourniquet was not placed.    Following adequate muscle relaxation with general anesthetic we moved to perform the reduction maneuver.  A blanket was wrapped around the patient's torso to provide countertraction.  I then provided longitudinal traction and then external rotation of the humerus.  The first attempt was unsuccessful.  We then provided a little distraction force on the proximal humerus lateral to the glenoid with longitudinal traction.  With that maneuver the shoulder was felt to reduce both audibly and via tactile touch.  We then range the shoulder with no blocks to motion.   Next we brought the portable fluoroscopic machine into view we took an AP and axillary view.  These did demonstrate that the glenohumeral joint was now reduced after previously being dislocated anterior.  The patient's right arm was placed in a sling with a immobilization strap.  The patient  was awakened from the general anesthetic without complication.  He was transported to PACU in stable condition.  POSTOPERATIVE PLAN:  Plan will be for follow-up in my office in 2 weeks for routine neurovascular check as well as x-rays.  He will likely need an MRI as well.  He will be in the sling and nonweightbearing to the operative arm until that follow-up appointment.  He may remove the sling for activities such as showering or getting dressed.  He will be nonweightbearing otherwise .

## 2018-12-08 NOTE — Anesthesia Preprocedure Evaluation (Addendum)
Anesthesia Evaluation  Patient identified by MRN, date of birth, ID band Patient awake    Reviewed: Allergy & Precautions, NPO status , Patient's Chart, lab work & pertinent test results  History of Anesthesia Complications Negative for: history of anesthetic complications  Airway Mallampati: II  TM Distance: >3 FB Neck ROM: Full    Dental  (+) Dental Advisory Given   Pulmonary neg pulmonary ROS,    Pulmonary exam normal        Cardiovascular negative cardio ROS Normal cardiovascular exam     Neuro/Psych Seizures -, Poorly Controlled,  PSYCHIATRIC DISORDERS Anxiety Depression  Oppositional defiant d/o    GI/Hepatic negative GI ROS, Neg liver ROS,   Endo/Other  negative endocrine ROS  Renal/GU negative Renal ROS     Musculoskeletal negative musculoskeletal ROS (+)   Abdominal   Peds  Hematology negative hematology ROS (+)   Anesthesia Other Findings Right shoulder dislocation following seizure yesterday  Reproductive/Obstetrics                            Anesthesia Physical Anesthesia Plan  ASA: II and emergent  Anesthesia Plan: General   Post-op Pain Management:    Induction: Intravenous  PONV Risk Score and Plan: 2 and Treatment may vary due to age or medical condition and Midazolam  Airway Management Planned: Natural Airway and Mask  Additional Equipment: None  Intra-op Plan:   Post-operative Plan:   Informed Consent: I have reviewed the patients History and Physical, chart, labs and discussed the procedure including the risks, benefits and alternatives for the proposed anesthesia with the patient or authorized representative who has indicated his/her understanding and acceptance.     Dental advisory given  Plan Discussed with: CRNA, Anesthesiologist and Surgeon  Anesthesia Plan Comments:        Anesthesia Quick Evaluation

## 2018-12-08 NOTE — H&P (Signed)
ORTHOPAEDIC H and P  REQUESTING PHYSICIAN: Ward, Delice Bison, DO  PCP:  Health, [facility]  Chief Complaint: Right shoulder dislocation  HPI: Stephen [name] is a 23 y.o. male who complains of right shoulder pain and deformity following a seizure prior to arrival.  He presented to the emergency department last night.  He has had 2 failed attempts at closed reduction maneuvers in the emergency department.  He has a longstanding history of seizure disorder but this is his alleged first dislocation.  He denies numbness or tingling.  He is a Ship broker at [employer].  He denies smoking or other medical comorbidities.  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   Depression    Seizures ([place])    Past Surgical History:  Procedure Laterality Date   CIRCUMCISION     Social History   Socioeconomic History   Marital status: Single    Spouse name: Not on file   Number of children: 0   Years of education: Currently in college   Highest education level: Not on file  Occupational History   Occupation: Ship broker at [employer] resource strain: Not on file   Food insecurity    Worry: Not on file    Inability: Not on file   Transportation needs    Medical: Not on file    Non-medical: Not on file  Tobacco Use   Smoking status: Never Smoker   Smokeless tobacco: Never Used  Substance and Sexual Activity   Alcohol use: No   Drug use: No   Sexual activity: Never  Lifestyle   Physical activity    Days per week: Not on file    Minutes per session: Not on file   Stress: Not on file  Relationships   Social connections    Talks on phone: Not on file    Gets together: Not on file    Attends religious service: Not on file    Active member of club or organization: Not on file    Attends meetings of clubs or organizations: Not on file    Relationship status: Not on file  Other Topics Concern   Not  on file  Social History Narrative   Kavir is attending [employer]. 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   Living with his father, step-mother, and siblings.   Right-handed.   He will sometimes drink a soda.   Family History  Problem Relation Age of Onset   Diabetes gravidarum Paternal Grandmother    Heart attack Paternal Grandmother    Healthy Mother    Healthy Father    Allergies  Allergen Reactions   Lactose Intolerance (Gi) Diarrhea   Prior to Admission medications   Medication Sig Start Date End Date Taking? Authorizing Provider  acetaminophen (TYLENOL) 325 MG tablet Take 650 mg by mouth every 6 (six) hours as needed for moderate pain.    [provider]  ibuprofen (ADVIL) 200 MG tablet Take 400 mg by mouth every 6 (six) hours as needed for moderate pain.    [provider]  lamoTRIgine (LAMICTAL) 100 MG tablet Take 1 tablet (100 mg total) by mouth 2 (two) times daily. 12/04/18   Henderly, Britni A, [name]  ondansetron (ZOFRAN) 4 MG tablet Take 1 tablet (4 mg total) by mouth every 6 (six) hours. Patient not taking: Reported on 10/22/2018 11/06/17   Romie Jumper, [name]   Dg Shoulder Right  Result Date: 12/07/2018 CLINICAL DATA:  Right arm pain after seizure EXAM: RIGHT SHOULDER - 2+ VIEW COMPARISON:  None. FINDINGS: There is anterior inferior dislocation of the humeral head. No definite osseous fracture seen. IMPRESSION: Anterior inferior dislocation of the humeral head. Electronically Signed   By: Jonna Clark M.D.   On: 12/07/2018 21:13   Dg Humerus Right  Result Date: 12/07/2018 CLINICAL DATA:  Fall, injury EXAM: RIGHT HUMERUS - 2+ VIEW COMPARISON:  None. FINDINGS: There is a tiny ossific fragment seen at the posterior olecranon, somewhat limited due to patient motion which could represent a nondisplaced tiny chip fracture. There is anterior inferior dislocation of the humeral head IMPRESSION: Anterior inferior dislocation of the humeral head. Tiny ossific  fragment seen adjacent to the posterior olecranon, somewhat limited due to patient motion which could represent a small chip fracture. Electronically Signed   By: Jonna Clark M.D.   On: 12/07/2018 21:11    Positive ROS: All other systems have been reviewed and were otherwise negative with the exception of those mentioned in the HPI and as above.  Physical Exam: General: Alert, no acute distress Cardiovascular: No pedal edema Respiratory: No cyanosis, no use of accessory musculature GI: No organomegaly, abdomen is soft and non-tender Skin: No lesions in the area of chief complaint Neurologic: Sensation intact distally Psychiatric: Patient is competent for consent with normal mood and affect Lymphatic: No axillary or cervical lymphadenopathy  MUSCULOSKELETAL:  Right shoulder has positive sulcus sign with guarding on exam.  He is neurovascularly intact throughout the arm though.  Assessment: Right shoulder anterior instability secondary to seizure.  Plan: -Our plan is for close reduction of the right shoulder in the operating room.  He has had failed attempts in the emergency department and is indicated for general anesthesia and muscle relaxation.  We discussed the risk, benefits, and indications of this procedure.  All questions were solicited and answered to his satisfaction.  -We will plan for discharge home postoperatively from PACU in a sling with follow-up with me next week.  He will likely need outpatient MRI to assess the nature of the shoulder injury.   Yolonda Kida, MD Cell [phone]    12/08/2018 3:56 AM

## 2018-12-08 NOTE — Discharge Instructions (Signed)
Please follow-up with your neurologist to discuss if you should have any of your medications for your seizures adjusted.  -Orthopedic follow-up: -No weightbearing to the right upper extremity.  You may use the arm for writing and light lifting but not to exceed 2 pounds.  Maintain the arm in the sling at all times unless performing activities of daily living.  -Follow-up with Dr. Stann Mainland in 1 to 2 weeks for routine postprocedural care.  - apply ice to the right shoulder for 20-30 minutes at a time for pain control.  - for mild to moderate pain use tylenol and advil around the clock as directed over the counter.

## 2018-12-08 NOTE — Anesthesia Postprocedure Evaluation (Signed)
Anesthesia Post Note  Patient: Stephen [name]  Procedure(s) Performed: CLOSED REDUCTION SHOULDER (Right Shoulder)     Patient location during evaluation: PACU Anesthesia Type: General Level of consciousness: awake and alert Pain management: pain level controlled Vital Signs Assessment: post-procedure vital signs reviewed and stable Respiratory status: spontaneous breathing, nonlabored ventilation and respiratory function stable Cardiovascular status: blood pressure returned to baseline and stable Postop Assessment: no apparent nausea or vomiting Anesthetic complications: no    Last Vitals:  Vitals:   12/08/18 0528 12/08/18 0543  BP: 123/70 133/77  Pulse: 63 81  Resp: 17 11  Temp:  36.9 C  SpO2: 99% 100%    Last Pain:  Vitals:   12/08/18 0543  TempSrc:   PainSc: [place]

## 2018-12-08 NOTE — Transfer of Care (Signed)
Immediate Anesthesia Transfer of Care Note  Patient: Stephen [name]  Procedure(s) Performed: CLOSED REDUCTION SHOULDER (Right Shoulder)  Patient Location: PACU  Anesthesia Type:General  Level of Consciousness: sedated  Airway & Oxygen Therapy: Patient Spontanous Breathing and Patient connected to face mask oxygen  Post-op Assessment: Report given to RN and Post -op Vital signs reviewed and stable  Post vital signs: Reviewed and stable  Last Vitals:  Vitals Value Taken Time  BP    Temp    Pulse 60 12/08/18 0442  Resp 16 12/08/18 0442  SpO2 100 % 12/08/18 0442  Vitals shown include unvalidated device data.  Last Pain:  Vitals:   12/08/18 0200  TempSrc:   PainSc: 8          Complications: No apparent anesthesia complications

## 2018-12-09 ENCOUNTER — Encounter (HOSPITAL_COMMUNITY): Payer: Self-pay | Admitting: Orthopedic Surgery

## 2018-12-11 LAB — LAMOTRIGINE LEVEL: Lamotrigine Lvl: 3.3 ug/mL (ref 2.0–20.0)

## 2019-04-17 ENCOUNTER — Encounter (HOSPITAL_COMMUNITY): Payer: Self-pay

## 2019-04-17 ENCOUNTER — Other Ambulatory Visit: Payer: Self-pay

## 2019-04-17 ENCOUNTER — Emergency Department (HOSPITAL_COMMUNITY): Payer: BLUE CROSS/BLUE SHIELD

## 2019-04-17 ENCOUNTER — Emergency Department (HOSPITAL_COMMUNITY)
Admission: EM | Admit: 2019-04-17 | Discharge: 2019-04-17 | Disposition: A | Discharge status: Home / Self Care | Payer: BLUE CROSS/BLUE SHIELD | Attending: Emergency Medicine | Admitting: Emergency Medicine

## 2019-04-17 DIAGNOSIS — Y999 Unspecified external cause status: Secondary | ICD-10-CM | POA: Diagnosis not present

## 2019-04-17 DIAGNOSIS — W1839XA Other fall on same level, initial encounter: Secondary | ICD-10-CM | POA: Insufficient documentation

## 2019-04-17 DIAGNOSIS — Y92002 Bathroom of unspecified non-institutional (private) residence single-family (private) house as the place of occurrence of the external cause: Secondary | ICD-10-CM | POA: Insufficient documentation

## 2019-04-17 DIAGNOSIS — S43014A Anterior dislocation of right humerus, initial encounter: Secondary | ICD-10-CM | POA: Insufficient documentation

## 2019-04-17 DIAGNOSIS — S43004A Unspecified dislocation of right shoulder joint, initial encounter: Secondary | ICD-10-CM

## 2019-04-17 DIAGNOSIS — Y9389 Activity, other specified: Secondary | ICD-10-CM | POA: Diagnosis not present

## 2019-04-17 DIAGNOSIS — M24411 Recurrent dislocation, right shoulder: Secondary | ICD-10-CM | POA: Diagnosis present

## 2019-04-17 MED ORDER — PROPOFOL 10 MG/ML IV BOLUS
INTRAVENOUS | Status: AC | PRN
  Administered 2019-04-17: 20 mg via INTRAVENOUS
  Administered 2019-04-17: 34 mg via INTRAVENOUS
  Administered 2019-04-17: 30 mg via INTRAVENOUS
  Administered 2019-04-17: 20 mg via INTRAVENOUS
  Administered 2019-04-17: 50 mg via INTRAVENOUS

## 2019-04-17 MED ORDER — HYDROMORPHONE HCL 1 MG/ML IJ SOLN
1.0000 mg | Freq: Once | INTRAMUSCULAR | Status: AC
  Administered 2019-04-17: 1 mg via INTRAVENOUS
  Filled 2019-04-17: qty 1

## 2019-04-17 MED ORDER — PROPOFOL 10 MG/ML IV BOLUS
0.5000 mg/kg | Freq: Once | INTRAVENOUS | Status: DC
  Filled 2019-04-17: qty 20

## 2019-04-17 NOTE — ED Notes (Signed)
Stephen [name] mother [phone] looking for an update

## 2019-04-17 NOTE — ED Triage Notes (Signed)
Pt arrives POV for eval of R shoulder pain after falling on it. Shoulder appears dislocated. +CSM to R hand, reports he slipped getting out of the shower.

## 2019-04-17 NOTE — ED Provider Notes (Signed)
Emergency Department Provider Note   I have reviewed the triage vital signs and the nursing notes.   HISTORY  Chief Complaint Fall   HPI Stephen [name] is a 24 y.o. male with PMH of seizure and prior shoulder dislocation in that setting presents to the emergency department for evaluation of right shoulder pain after fall.  Patient states that he was  walking out of the shower when he fell and landed on his right shoulder.  He felt severe pain and feels that his shoulder is dislocated.  This was confirmed on x-ray from triage.  He denies any numbness or tingling in the arm or hand.  He did not strike his head or lose consciousness.  No pain in the elbow or wrist.  No back or neck pain.  Pain is severe and worse with movement.  Past Medical History:  Diagnosis Date  . 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 Depression   . Seizures [facility])     Patient Active Problem List   Diagnosis Date Noted  . Depression 12/21/2016  . Facial rash 10/26/2016  . Lethargy 10/26/2016  . Abnormal TSH 10/26/2016  . Mood changes 10/15/2016  . 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 Outbursts of anger 12/31/2015  . Encounter for Aditya Nastasi-term (current) use of high-risk medication 12/23/2014  . ODD (oppositional defiant disorder) 11/12/2014  . 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 MDD (major depressive disorder), recurrent episode, moderate (HCC) 11/12/2014  . 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 Seizure disorder (HCC) 08/31/2012    Past Surgical History:  Procedure Laterality Date  . CIRCUMCISION    . SHOULDER CLOSED REDUCTION Right 12/08/2018   Procedure: CLOSED REDUCTION SHOULDER;  Surgeon: Yolonda Kida, MD;  Location: [facility] OR;  Service: Orthopedics;  Laterality: Right;    Allergies Lactose and Lactose intolerance (gi)  Family History  Problem Relation Age of Onset  . Diabetes  gravidarum Paternal Grandmother   . Heart attack Paternal Grandmother   . Healthy Mother   . Healthy Father     Social History Social History   Tobacco Use  . Smoking status: Never Smoker  . Smokeless tobacco: Never Used  Substance Use Topics  . Alcohol use: No  . Drug use: No    Review of Systems  Constitutional: No fever/chills Cardiovascular: Denies chest pain. Respiratory: Denies shortness of breath. Gastrointestinal: No abdominal pain.  Musculoskeletal: Negative for back pain. Positive right shoulder pain.  Skin: Negative for rash. Neurological: Negative for headaches.  10-point ROS otherwise negative.  ____________________________________________   PHYSICAL EXAM:  VITAL SIGNS: ED Triage Vitals [04/17/19 1852]  Enc Vitals Group     BP 123/83     Pulse Rate 81     Resp 18     Temp 98.4 F (36.9 C)     Temp Source Oral     SpO2 100 %     Weight 150 lb (68 kg)     Height 5\' 9"  (1.753 m)   Constitutional: Alert and oriented. Well appearing and in no acute distress. Eyes: Conjunctivae are normal.  Head: Atraumatic. Nose: No congestion/rhinnorhea. Mouth/Throat: Mucous membranes are moist.  Neck: No stridor.   Cardiovascular: Normal rate, regular rhythm. Good peripheral circulation in the right arm. Grossly normal heart sounds.   Respiratory: Normal respiratory effort.  No retractions. Lungs CTAB. Gastrointestinal: Soft and nontender. No distention.  Musculoskeletal: Right arm held close to body flexed at the  elbow.  Palpable humeral head anteriorly.  No tenderness over the elbow or wrist. Neurologic:  Normal speech and language. No gross focal neurologic deficits are appreciated.  Skin:  Skin is warm, dry and intact. No rash noted.  ____________________________________________  RADIOLOGY  DG Shoulder 1 View Right  Result Date: 04/17/2019 CLINICAL DATA:  24 year old male status post reduction of right shoulder dislocation. EXAM: RIGHT SHOULDER - 1 VIEW  COMPARISON:  Earlier radiograph dated 04/17/2019. FINDINGS: Interval reduction of the previously seen dislocated right shoulder. The glenohumeral alignment appears anatomic on this single frontal view. IMPRESSION: Interval reduction of the right shoulder. Electronically Signed   By: Anner Crete M.D.   On: 04/17/2019 21:41   DG Shoulder Right  Result Date: 04/17/2019 CLINICAL DATA:  Fall with shoulder injury EXAM: RIGHT SHOULDER - 2+ VIEW COMPARISON:  None. FINDINGS: AC joint appears intact. Anterior, inferior dislocation of the right humeral head with respect to the glenoid fossa. No definitive fracture is seen. IMPRESSION: Anterior, inferior dislocation of the right humeral head with respect to the glenoid fossa Electronically Signed   By: Donavan Foil M.D.   On: 04/17/2019 19:14    ____________________________________________   PROCEDURES  Procedure(s) performed:   .Sedation  Date/Time: 04/18/2019 12:57 PM Performed by: Margette Fast, MD Authorized by: Margette Fast, MD   Consent:    Consent obtained:  Verbal   Consent given by:  Patient   Risks discussed:  Allergic reaction, dysrhythmia, inadequate sedation, nausea, prolonged hypoxia resulting in organ damage, prolonged sedation necessitating reversal, respiratory compromise necessitating ventilatory assistance and intubation and vomiting   Alternatives discussed:  Analgesia without sedation, anxiolysis and regional anesthesia Universal protocol:    Procedure explained and questions answered to patient or proxy's satisfaction: yes     Relevant documents present and verified: yes     Test results available and properly labeled: yes     Imaging studies available: yes     Required blood products, implants, devices, and special equipment available: yes     Site/side marked: yes     Immediately prior to procedure a time out was called: yes     Patient identity confirmation method:  Verbally with patient Indications:    Procedure  necessitating sedation performed by:  Physician performing sedation Pre-sedation assessment:    Time since last food or drink:  6 hours   ASA classification: class 1 - normal, healthy patient     Neck mobility: normal     Mouth opening:  3 or more finger widths   Thyromental distance:  4 finger widths   Mallampati score:  I - soft palate, uvula, fauces, pillars visible   Pre-sedation assessments completed and reviewed: airway patency, cardiovascular function, hydration status, mental status, nausea/vomiting, pain level, respiratory function and temperature   Immediate pre-procedure details:    Reassessment: Patient reassessed immediately prior to procedure     Reviewed: vital signs, relevant labs/tests and NPO status     Verified: bag valve mask available, emergency equipment available, intubation equipment available, IV patency confirmed, oxygen available and suction available   Procedure details (see MAR for exact dosages):    Preoxygenation:  Nasal cannula   Sedation:  Propofol   Intended level of sedation: deep   Intra-procedure monitoring:  Blood pressure monitoring, cardiac monitor, continuous pulse oximetry, frequent LOC assessments, frequent vital sign checks and continuous capnometry   Intra-procedure events: none     Total Provider sedation time (minutes):  30 Post-procedure details:  Attendance: Constant attendance by certified staff until patient recovered     Recovery: Patient returned to pre-procedure baseline     Post-sedation assessments completed and reviewed: airway patency, cardiovascular function, hydration status, mental status, nausea/vomiting, pain level, respiratory function and temperature     Patient is stable for discharge or admission: yes     Patient tolerance:  Tolerated well, no immediate complications Reduction of dislocation  Date/Time: 04/18/2019 12:58 PM Performed by: Maia Plan, MD Authorized by: Maia Plan, MD  Consent: Written consent  obtained. Risks and benefits: risks, benefits and alternatives were discussed Consent given by: patient Required items: required blood products, implants, devices, and special equipment available Patient identity confirmed: verbally with patient and arm band Time out: Immediately prior to procedure a "time out" was called to verify the correct patient, procedure, equipment, support staff and site/side marked as required. Local anesthesia used: no  Anesthesia: Local anesthesia used: no  Sedation: Patient sedated: yes (see assocaited procedure note)  Patient tolerance: patient tolerated the procedure well with no immediate complications Comments: Downward traction applied to the right elbow with external rotation while holding counter traction with a sheet. Palpable reduction of the humeral head with normal passive ROM under sedation.       ____________________________________________   INITIAL IMPRESSION / ASSESSMENT AND PLAN / ED COURSE  Pertinent labs & imaging results that were available during my care of the patient were reviewed by me and considered in my medical decision making (see chart for details).   Patient presents to the ED with right shoulder pain after fall. Anterior dislocation on plain film. Patient provided written consent for sedation and reduction. No complications and joint reduced as above. Patient returned to baseline, is tolerating PO, and is ambulatory. Patient discharged with mom to drive him home. Discussed sling use and ortho follow up in the coming week. Reviewed ROM exercises.    ____________________________________________  FINAL CLINICAL IMPRESSION(S) / ED DIAGNOSES  Final diagnoses:  Dislocation of right shoulder joint, initial encounter     MEDICATIONS GIVEN DURING THIS VISIT:  Medications  HYDROmorphone (DILAUDID) injection 1 mg (1 mg Intravenous Given 04/17/19 2008)  propofol (DIPRIVAN) 10 mg/mL bolus/IV push (50 mg Intravenous Given 04/17/19  2124)     Note:  This document was prepared using Dragon voice recognition software and may include unintentional dictation errors.  Alona Bene, MD, [facility] Emergency Medicine    Suzzane Quilter, Arlyss Repress, MD 04/18/19 [phone]

## 2019-04-17 NOTE — Discharge Instructions (Signed)
Please keep your shoulder in the sling.  Take Tylenol and or Motrin as needed for pain.  Please call the orthopedic doctor tomorrow for follow-up.

## 2019-05-02 DIAGNOSIS — M24311 Pathological dislocation of right shoulder, not elsewhere classified: Secondary | ICD-10-CM | POA: Insufficient documentation

## 2019-07-14 ENCOUNTER — Emergency Department (HOSPITAL_COMMUNITY): Payer: BLUE CROSS/BLUE SHIELD

## 2019-07-14 ENCOUNTER — Encounter (HOSPITAL_COMMUNITY): Payer: Self-pay

## 2019-07-14 ENCOUNTER — Emergency Department (HOSPITAL_COMMUNITY)
Admission: EM | Admit: 2019-07-14 | Discharge: 2019-07-14 | Disposition: A | Discharge status: Home / Self Care | Payer: BLUE CROSS/BLUE SHIELD | Attending: Emergency Medicine | Admitting: Emergency Medicine

## 2019-07-14 ENCOUNTER — Other Ambulatory Visit: Payer: Self-pay

## 2019-07-14 DIAGNOSIS — Y939 Activity, unspecified: Secondary | ICD-10-CM | POA: Insufficient documentation

## 2019-07-14 DIAGNOSIS — Y929 Unspecified place or not applicable: Secondary | ICD-10-CM | POA: Diagnosis not present

## 2019-07-14 DIAGNOSIS — Y999 Unspecified external cause status: Secondary | ICD-10-CM | POA: Insufficient documentation

## 2019-07-14 DIAGNOSIS — Z79899 Other long term (current) drug therapy: Secondary | ICD-10-CM | POA: Insufficient documentation

## 2019-07-14 DIAGNOSIS — M24421 Recurrent dislocation, right elbow: Secondary | ICD-10-CM

## 2019-07-14 DIAGNOSIS — X500XXA Overexertion from strenuous movement or load, initial encounter: Secondary | ICD-10-CM | POA: Diagnosis not present

## 2019-07-14 DIAGNOSIS — M24411 Recurrent dislocation, right shoulder: Secondary | ICD-10-CM | POA: Insufficient documentation

## 2019-07-14 MED ORDER — PROPOFOL 10 MG/ML IV BOLUS
INTRAVENOUS | Status: AC | PRN
  Administered 2019-07-14 (×2): 40 mg via INTRAVENOUS

## 2019-07-14 MED ORDER — PROPOFOL 10 MG/ML IV BOLUS
0.5000 mg/kg | Freq: Once | INTRAVENOUS | Status: DC
  Filled 2019-07-14: qty 20

## 2019-07-14 MED ORDER — ACETAMINOPHEN 325 MG PO TABS
650.0000 mg | ORAL_TABLET | Freq: Once | ORAL | Status: AC
  Administered 2019-07-14: 650 mg via ORAL
  Filled 2019-07-14: qty 2

## 2019-07-14 MED ORDER — SODIUM CHLORIDE 0.9 % IV SOLN
INTRAVENOUS | Status: AC | PRN
  Administered 2019-07-14: 10 mL/h via INTRAVENOUS

## 2019-07-14 NOTE — Sedation Documentation (Signed)
Ortho tech at bedside to place sling

## 2019-07-14 NOTE — ED Provider Notes (Signed)
[facility] EMERGENCY DEPARTMENT Provider Note   CSN: [id number] Arrival date & time: 07/14/19  4034     History No chief complaint on file.   Kaison Mcparland is a 24 y.o. male.  HPI HPI Comments: Lennex Pietila is a 24 y.o. male who presents to the Emergency Department complaining of sudden onset right shoulder pain.  Patient was talking on the phone and straightening his right arm and felt a "pop" as well as sudden onset of pain.  His pain is 5/10.  He notes a history of right shoulder dislocation back in March.  He was evaluated for this and had it relocated at the time.  His symptoms feel similar.  Patient is unable to move the right upper extremity of the shoulder.  He is however able to move it at the elbow and wrist.  He denies numbness, tingling.     Past Medical History:  Diagnosis Date  . 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 Depression   . Seizures [facility])     Patient Active Problem List   Diagnosis Date Noted  . Depression 12/21/2016  . Facial rash 10/26/2016  . Lethargy 10/26/2016  . Abnormal TSH 10/26/2016  . Mood changes 10/15/2016  . 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 Outbursts of anger 12/31/2015  . Encounter for long-term (current) use of high-risk medication 12/23/2014  . ODD (oppositional defiant disorder) 11/12/2014  . 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 MDD (major depressive disorder), recurrent episode, moderate (HCC) 11/12/2014  . 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 Seizure disorder (HCC) 08/31/2012    Past Surgical History:  Procedure Laterality Date  . CIRCUMCISION    . SHOULDER CLOSED REDUCTION Right 12/08/2018   Procedure: CLOSED REDUCTION SHOULDER;  Surgeon: Yolonda Kida, MD;  Location: [facility] OR;  Service: Orthopedics;  Laterality: Right;       Family History  Problem Relation Age of Onset  . Diabetes  gravidarum Paternal Grandmother   . Heart attack Paternal Grandmother   . Healthy Mother   . Healthy Father     Social History   Tobacco Use  . Smoking status: Never Smoker  . Smokeless tobacco: Never Used  Substance Use Topics  . Alcohol use: No  . Drug use: No    Home Medications Prior to Admission medications   Medication Sig Start Date End Date Taking? Authorizing Provider  citalopram (CELEXA) 10 MG tablet Take 10 mg by mouth daily. 03/27/19   [provider]  HYDROcodone-acetaminophen (NORCO) 5-325 MG tablet Take 1 tablet by mouth every 6 (six) hours as needed for moderate pain. Patient not taking: Reported on 04/17/2019 12/08/18   Yolonda Kida, MD  lamoTRIgine (LAMICTAL) 25 MG tablet Take 25 mg by mouth daily.  04/04/19   [provider]  ondansetron (ZOFRAN) 4 MG tablet Take 1 tablet (4 mg total) by mouth every 6 (six) hours. Patient not taking: Reported on 10/22/2018 11/06/17   Dagoberto Ligas I, [name]    Allergies    Lactose and Lactose intolerance (gi)  Review of Systems   Review of Systems  Musculoskeletal: Positive for arthralgias and myalgias. Negative for joint swelling.  Skin: Negative for color change and wound.  Neurological: Positive for weakness. Negative for numbness.   Physical Exam Updated Vital Signs BP (!) 142/88 (BP Location: Left Arm)   Pulse 81   Temp 98.4 F (36.9 C) (Oral)   Resp 14  Ht 5\' 9"  (1.753 m)   Wt 63.5 kg   SpO2 98%   BMI 20.67 kg/m   Physical Exam Vitals and nursing note reviewed.  Constitutional:      General: He is in acute distress.     Appearance: Normal appearance. He is not ill-appearing, toxic-appearing or diaphoretic.  HENT:     Head: Normocephalic and atraumatic.     Right Ear: External ear normal.     Left Ear: External ear normal.     Nose: Nose normal.     Mouth/Throat:     Mouth: Mucous membranes are moist.     Pharynx: Oropharynx is clear. No oropharyngeal exudate or posterior  oropharyngeal erythema.  Eyes:     Extraocular Movements: Extraocular movements intact.  Cardiovascular:     Rate and Rhythm: Normal rate.     Pulses: Normal pulses.     Comments: 2+ radial pulses noted bilaterally Pulmonary:     Effort: Pulmonary effort is normal.  Abdominal:     General: Abdomen is flat.     Tenderness: There is no abdominal tenderness.  Musculoskeletal:        General: Tenderness, deformity and signs of injury present. No swelling. Normal range of motion.     Cervical back: Normal range of motion. No tenderness.     Right lower leg: No edema.     Left lower leg: No edema.     Comments: Obvious deformity noted at the right shoulder.  Moderate pain circumferentially along the right shoulder.  No edema or erythema.  No skin tenting.  Unable to assess range of motion of the right shoulder.  Patient has full range of motion of the right elbow and wrist.  Patient able to flex and extend the fingers of the right hand.  Skin:    General: Skin is warm and dry.     Comments: Good cap refill.  Neurological:     General: No focal deficit present.     Mental Status: He is alert and oriented to person, place, and time.     Comments: Distal sensation intact in all the fingers of the right hand.  Psychiatric:        Mood and Affect: Mood normal.        Behavior: Behavior normal.    ED Results / Procedures / Treatments   Labs (all labs ordered are listed, but only abnormal results are displayed) Labs Reviewed - No data to display  EKG None  Radiology DG Shoulder Right  Result Date: 07/14/2019 CLINICAL DATA:  Recent injury, history of dislocations EXAM: RIGHT SHOULDER - 2+ VIEW COMPARISON:  04/17/2019 FINDINGS: Anterior inferior dislocation of the humeral head is noted with respect to the glenoid. No definitive fracture is seen. No soft tissue abnormality is noted. IMPRESSION: Anterior inferior dislocation of the right humeral head. Electronically Signed   By: Inez Catalina M.D.   On: 07/14/2019 08:42   DG Shoulder Right Portable  Result Date: 07/14/2019 CLINICAL DATA:  Reduction of right shoulder dislocation EXAM: PORTABLE RIGHT SHOULDER COMPARISON:  07/14/2019 right shoulder radiographs FINDINGS: Successful reduction of right glenohumeral dislocation with no residual malalignment. Hill-Sachs deformity of posterolateral right humeral head. No focal osseous lesions. No significant arthropathy. No radiopaque foreign bodies. IMPRESSION: Successful reduction of right glenohumeral dislocation with no residual malalignment. Hill-Sachs deformity of the posterolateral right humeral head. Electronically Signed   By: Ilona Sorrel M.D.   On: 07/14/2019 11:20    Procedures Procedures (including critical  care time)  Medications Ordered in ED Medications  propofol (DIPRIVAN) 10 mg/mL bolus/IV push 31.8 mg (has no administration in time range)    ED Course  I have reviewed the triage vital signs and the nursing notes.  Pertinent labs & imaging results that were available during my care of the patient were reviewed by me and considered in my medical decision making (see chart for details).    MDM Rules/Calculators/A&P                      Patient is a 24 year old male with signs and symptoms as noted above.  Patient has a history of recurrent dislocations of the right shoulder.  He presents today with another dislocation.  X-rays obtained show a anterior-inferior dislocation of the right humerus.  I spoke to my attending physician Dr. Cathren Laine regarding this patient.  He also evaluated him.  This patient was consciously sedated and his shoulder was reduced by my attending physician.  Post reduction images were obtained showing a successful reduction of the right shoulder.  I discussed this with patient.  I recommended that he follow-up with orthopedics regarding his right shoulder since this seems to be a recurrent issue.  His questions were answered and he  was amicable to time of discharge.  His vital signs are stable.   Patient discharged to home/self care.  Condition at discharge: Stable  Note: Portions of this report may have been transcribed using voice recognition software. Every effort was made to ensure accuracy; however, inadvertent computerized transcription errors may be present.     Final Clinical Impression(s) / ED Diagnoses Final diagnoses:  Recurrent dislocation of upper arm joint, right    Rx / DC Orders ED Discharge Orders    None       Placido Sou, [name] 07/14/19 1557    Cathren Laine, MD 07/15/19 [phone]

## 2019-07-14 NOTE — ED Triage Notes (Signed)
Patient complains of right shoulder pain after talking on phone and stretching arm. Pain with ROM. Patient states has hx of same

## 2019-07-14 NOTE — Sedation Documentation (Signed)
Portable XR at bedside

## 2019-07-14 NOTE — Progress Notes (Signed)
Orthopedic Tech Progress Note Patient Details:  Stephen [name] 04/28/1995 [id number]  Ortho Devices Type of Ortho Device: Sling immobilizer Ortho Device/Splint Location: URE Ortho Device/Splint Interventions: Application, Ordered   Post Interventions Patient Tolerated: Well Instructions Provided: Care of device   [name] A Kayton Dunaj 07/14/2019, 11:07 AM

## 2019-07-14 NOTE — Discharge Instructions (Addendum)
Per our discussion, please follow up with orthopaedics regarding your right shoulder for further evaluation.  Please do not hesitate to return to the emergency department if your symptoms return.   It was a pleasure to meet you.

## 2019-08-21 ENCOUNTER — Encounter: Payer: Self-pay | Admitting: Neurology

## 2019-08-23 ENCOUNTER — Other Ambulatory Visit: Payer: Self-pay

## 2019-08-23 ENCOUNTER — Emergency Department (HOSPITAL_COMMUNITY)
Admission: EM | Admit: 2019-08-23 | Discharge: 2019-08-24 | Disposition: A | Discharge status: Home / Self Care | Payer: 59 | Attending: Emergency Medicine | Admitting: Emergency Medicine

## 2019-08-23 ENCOUNTER — Encounter (HOSPITAL_COMMUNITY): Payer: Self-pay | Admitting: Emergency Medicine

## 2019-08-23 ENCOUNTER — Emergency Department (HOSPITAL_COMMUNITY): Payer: 59

## 2019-08-23 DIAGNOSIS — S43004A Unspecified dislocation of right shoulder joint, initial encounter: Secondary | ICD-10-CM | POA: Insufficient documentation

## 2019-08-23 DIAGNOSIS — Y999 Unspecified external cause status: Secondary | ICD-10-CM | POA: Insufficient documentation

## 2019-08-23 DIAGNOSIS — Y9372 Activity, wrestling: Secondary | ICD-10-CM | POA: Insufficient documentation

## 2019-08-23 DIAGNOSIS — Z79899 Other long term (current) drug therapy: Secondary | ICD-10-CM | POA: Insufficient documentation

## 2019-08-23 DIAGNOSIS — Y929 Unspecified place or not applicable: Secondary | ICD-10-CM | POA: Insufficient documentation

## 2019-08-23 DIAGNOSIS — X509XXA Other and unspecified overexertion or strenuous movements or postures, initial encounter: Secondary | ICD-10-CM | POA: Insufficient documentation

## 2019-08-23 DIAGNOSIS — S4991XA Unspecified injury of right shoulder and upper arm, initial encounter: Secondary | ICD-10-CM | POA: Diagnosis present

## 2019-08-23 LAB — CBC WITH DIFFERENTIAL/PLATELET
Abs Immature Granulocytes: 0 10*3/uL (ref 0.00–0.07)
Basophils Absolute: 0 10*3/uL (ref 0.0–0.1)
Basophils Relative: 1 %
Eosinophils Absolute: 0.1 10*3/uL (ref 0.0–0.5)
Eosinophils Relative: 4 %
HCT: 40.6 % (ref 39.0–52.0)
Hemoglobin: 13.2 g/dL (ref 13.0–17.0)
Immature Granulocytes: 0 %
Lymphocytes Relative: 54 %
Lymphs Abs: 1.9 10*3/uL (ref 0.7–4.0)
MCH: 29 pg (ref 26.0–34.0)
MCHC: 32.5 g/dL (ref 30.0–36.0)
MCV: 89.2 fL (ref 80.0–100.0)
Monocytes Absolute: 0.4 10*3/uL (ref 0.1–1.0)
Monocytes Relative: 11 %
Neutro Abs: 1 10*3/uL — ABNORMAL LOW (ref 1.7–7.7)
Neutrophils Relative %: 30 %
Platelets: 273 10*3/uL (ref 150–400)
RBC: 4.55 MIL/uL (ref 4.22–5.81)
RDW: 12.1 % (ref 11.5–15.5)
WBC: 3.4 10*3/uL — ABNORMAL LOW (ref 4.0–10.5)
nRBC: 0 % (ref 0.0–0.2)

## 2019-08-23 MED ORDER — OXYCODONE-ACETAMINOPHEN 5-325 MG PO TABS
1.0000 | ORAL_TABLET | Freq: Once | ORAL | Status: AC
  Administered 2019-08-23: 1 via ORAL
  Filled 2019-08-23: qty 1

## 2019-08-23 NOTE — ED Triage Notes (Signed)
Patient dislocated his right shoulder joint this evening , denies injury or fall , history of multiple dislocation in the past .

## 2019-08-24 ENCOUNTER — Emergency Department (HOSPITAL_COMMUNITY): Payer: 59

## 2019-08-24 LAB — BASIC METABOLIC PANEL
Anion gap: 9 (ref 5–15)
BUN: 6 mg/dL (ref 6–20)
CO2: 28 mmol/L (ref 22–32)
Calcium: 9.3 mg/dL (ref 8.9–10.3)
Chloride: 105 mmol/L (ref 98–111)
Creatinine, Ser: 0.77 mg/dL (ref 0.61–1.24)
GFR calc Af Amer: >60 mL/min (ref >60)
GFR calc non Af Amer: >60 mL/min (ref >60)
Glucose, Bld: 97 mg/dL (ref 70–99)
Potassium: 3.9 mmol/L (ref 3.5–5.1)
Sodium: 142 mmol/L (ref 135–145)

## 2019-08-24 MED ORDER — FENTANYL CITRATE (PF) 100 MCG/2ML IJ SOLN
50.0000 ug | Freq: Once | INTRAMUSCULAR | Status: AC
  Administered 2019-08-24: 50 ug via INTRAVENOUS
  Filled 2019-08-24: qty 2

## 2019-08-24 MED ORDER — PROPOFOL 10 MG/ML IV BOLUS
1.0000 mg/kg | Freq: Once | INTRAVENOUS | Status: AC
  Administered 2019-08-24: 75 mg via INTRAVENOUS
  Filled 2019-08-24: qty 20

## 2019-08-24 MED ORDER — PROPOFOL 10 MG/ML IV BOLUS
INTRAVENOUS | Status: AC | PRN
  Administered 2019-08-24: 75 mg via INTRAVENOUS
  Administered 2019-08-24: 50 mg via INTRAVENOUS

## 2019-08-24 NOTE — ED Provider Notes (Signed)
[facility] EMERGENCY DEPARTMENT Provider Note   CSN: [id number] Arrival date & time: 08/23/19  2217     History Chief Complaint  Patient presents with  . Dislocation    Right Shoulder    Stephen [name] is a 24 y.o. male with a history of anxiety, depression, & recurrent shoulder dislocation who presents to the ED with complaints of R shoulder dislocation that occurred @ 20:00 tonight. Patient states that he was wrestling with someone when he felt his right shoulder pop out of place. Pain since onset, worse with movement attempts, no alleviating factors. Feels like prior shoulder dislocations. Denies other areas of injury. Denies numbness, tingling, or weakness.   HPI     Past Medical History:  Diagnosis Date  . 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 Depression   . Seizures [facility])     Patient Active Problem List   Diagnosis Date Noted  . Depression 12/21/2016  . Facial rash 10/26/2016  . Lethargy 10/26/2016  . Abnormal TSH 10/26/2016  . Mood changes 10/15/2016  . 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 Outbursts of anger 12/31/2015  . Encounter for long-term (current) use of high-risk medication 12/23/2014  . ODD (oppositional defiant disorder) 11/12/2014  . 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 MDD (major depressive disorder), recurrent episode, moderate (HCC) 11/12/2014  . 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 Seizure disorder (HCC) 08/31/2012    Past Surgical History:  Procedure Laterality Date  . CIRCUMCISION    . SHOULDER CLOSED REDUCTION Right 12/08/2018   Procedure: CLOSED REDUCTION SHOULDER;  Surgeon: Yolonda Kida, MD;  Location: [facility] OR;  Service: Orthopedics;  Laterality: Right;       Family History  Problem Relation Age of Onset  . Diabetes gravidarum Paternal Grandmother   . Heart attack Paternal Grandmother   . Healthy  Mother   . Healthy Father     Social History   Tobacco Use  . Smoking status: Never Smoker  . Smokeless tobacco: Never Used  Vaping Use  . Vaping Use: Every day  Substance Use Topics  . Alcohol use: No  . Drug use: No    Home Medications Prior to Admission medications   Medication Sig Start Date End Date Taking? Authorizing Provider  citalopram (CELEXA) 10 MG tablet Take 10 mg by mouth daily. 03/27/19   [provider]  HYDROcodone-acetaminophen (NORCO) 5-325 MG tablet Take 1 tablet by mouth every 6 (six) hours as needed for moderate pain. Patient not taking: Reported on 04/17/2019 12/08/18   Yolonda Kida, MD  lamoTRIgine (LAMICTAL) 25 MG tablet Take 25 mg by mouth daily.  04/04/19   [provider]  ondansetron (ZOFRAN) 4 MG tablet Take 1 tablet (4 mg total) by mouth every 6 (six) hours. Patient not taking: Reported on 10/22/2018 11/06/17   Dagoberto Ligas I, [name]    Allergies    Lactose and Lactose intolerance (gi)  Review of Systems   Review of Systems  Constitutional: Negative for chills and fever.  Respiratory: Negative for shortness of breath.   Cardiovascular: Negative for chest pain.  Gastrointestinal: Negative for abdominal pain and vomiting.  Musculoskeletal: Positive for arthralgias.  Neurological: Negative for weakness and numbness.  All other systems reviewed and are negative.   Physical Exam Updated Vital Signs BP 123/84 (BP Location: Left Arm)   Pulse 61   Temp 98.2 F (36.8 C) (Oral)   Resp 16  Ht 5\' 9"  (1.753 m)   Wt 75 kg   SpO2 100%   BMI 24.42 kg/m   Physical Exam Vitals and nursing note reviewed.  Constitutional:      General: He is not in acute distress.    Appearance: Normal appearance. He is well-developed. He is not ill-appearing or toxic-appearing.  HENT:     Head: Normocephalic and atraumatic.  Eyes:     General:        Right eye: No discharge.        Left eye: No discharge.     Conjunctiva/sclera:  Conjunctivae normal.  Neck:     Comments: No midline tenderness.  Cardiovascular:     Rate and Rhythm: Normal rate and regular rhythm.     Pulses:          Radial pulses are 2+ on the right side and 2+ on the left side.  Pulmonary:     Effort: Pulmonary effort is normal. No respiratory distress.     Breath sounds: Normal breath sounds. No wheezing, rhonchi or rales.  Abdominal:     General: There is no distension.     Palpations: Abdomen is soft.     Tenderness: There is no abdominal tenderness.  Musculoskeletal:     Cervical back: Normal range of motion and neck supple.     Comments: Upper extremities: obvious deformity to right shoulder, no ecchymosis or open wounds. Intact AROM throughout UEs with exception of R shoulder. Tender over the R glenohumeral joint. Otherwise nontender No midline spinal tenderness.   Skin:    General: Skin is warm and dry.     Capillary Refill: Capillary refill takes less than 2 seconds.     Findings: No rash.  Neurological:     Mental Status: He is alert.     Comments: Alert. Clear speech. Sensation grossly intact to bilateral upper extremities. 5/5 symmetric grip strength.  Able to perform okay sign, thumbs up, cross second/third digits bilaterally.  Ambulatory.   Psychiatric:        Mood and Affect: Mood normal.        Behavior: Behavior normal.     ED Results / Procedures / Treatments   Lab (all labs ordered are listed, but only abnormal results are displayed) Labs Reviewed  CBC WITH DIFFERENTIAL/PLATELET - Abnormal; Notable for the following components:      Result Value   WBC 3.4 (*)    Neutro Abs 1.0 (*)    All other components within normal limits  BASIC METABOLIC PANEL    EKG None  Radiology DG Shoulder Right  Result Date: 08/23/2019 CLINICAL DATA:  Dislocation. EXAM: RIGHT SHOULDER - 2+ VIEW COMPARISON:  Most recent radiographs 07/14/2019 FINDINGS: Anterior dislocation of the humerus with respect to the glenoid. Hill-Sachs  impaction injury to the lateral humeral head again seen. Acromioclavicular joint is congruent. IMPRESSION: 1. Anterior shoulder dislocation. 2. Hill-Sachs impaction injury to the lateral humeral head, also seen on prior exam. Electronically Signed   By: 07/16/2019 M.D.   On: 08/23/2019 23:05   DG Shoulder Right Portable  Result Date: 08/24/2019 CLINICAL DATA:  Status post reduction EXAM: PORTABLE RIGHT SHOULDER COMPARISON:  Films from the previous day FINDINGS: Previously seen dislocation has been reduced. No acute fracture is noted. Underlying bony thorax appears within normal limits. IMPRESSION: Reduction of previously seen dislocation. Electronically Signed   By: 10/25/2019 M.D.   On: 08/24/2019 02:33    Procedures Reduction of dislocation  Date/Time:  08/24/2019 2:27 AM Performed by: Cherly Anderson, [name] Authorized by: Cherly Anderson, [name]  Consent: Verbal consent obtained. Risks and benefits: risks, benefits and alternatives were discussed Consent given by: patient Patient understanding: patient states understanding of the procedure being performed Patient consent: the patient's understanding of the procedure matches consent given Procedure consent: procedure consent matches procedure scheduled Relevant documents: relevant documents present and verified Test results: test results available and properly labeled Site marked: the operative site was marked Imaging studies: imaging studies available Required items: required blood products, implants, devices, and special equipment available Preparation: Patient was prepped and draped in the usual sterile fashion.  Sedation: Patient sedated: yes Sedatives: propofol and see MAR for details Sedation start date/time: 08/24/2019 2:04 AM Sedation end date/time: 08/24/2019 2:14 AM Vitals: Vital signs were monitored during sedation.  Patient tolerance: patient tolerated the procedure well with no immediate complications     SPLINT APPLICATION Date/Time:02:12 AM Authorized by: Harvie Heck Consent: Verbal consent obtained. Risks and benefits: risks, benefits and alternatives were discussed Consent given by: patient Splint applied by: orthopedic technician Location details: RUE Splint type: sling immobilizer Supplies used: sling immobilizer.  Post-procedure: The splinted body part was neurovascularly unchanged following the procedure. Patient tolerance: Patient tolerated the procedure well with no immediate complications.  (including critical care time)  Medications Ordered in ED Medications  propofol (DIPRIVAN) 10 mg/mL bolus/IV push 75 mg (has no administration in time range)  fentaNYL (SUBLIMAZE) injection 50 mcg (has no administration in time range)  oxyCODONE-acetaminophen (PERCOCET/ROXICET) 5-325 MG per tablet 1 tablet (1 tablet Oral Given 08/23/19 2241)    ED Course  I have reviewed the triage vital signs and the nursing notes.  Pertinent labs & imaging results that were available during my care of the patient were reviewed by me and considered in my medical decision making (see chart for details).   MDM Rules/Calculators/A&P                         Patient presents with right shoulder dislocation, history of same.  X-ray obtained per triage protocol confirms this.  Screening labs unremarkable, mild leukopenia, PCP recheck.  Neurovascularly intact distally.  Conscious sedation and reduction performed with supervising physician Dr. Blinda Leatherwood @ bedside.   I ordered and personally reviewed and interpreted pre and post reduction imaging, Reduction of previously seen dislocation noted on repeat x-ray. Patient remains NVI distally following procedure. Feeling much better. Awake, alert, oriented, & tolerating PO with request to go home. Will discharge in sling with ortho follow up. I discussed results, treatment plan, need for follow-up, and return precautions with the patient. Provided opportunity for  questions, patient confirmed understanding and is in agreement with plan.   Final Clinical Impression(s) / ED Diagnoses Final diagnoses:  Dislocation of right shoulder joint, initial encounter    Rx / DC Orders ED Discharge Orders    None       Cherly Anderson, [name] 08/24/19 0326    Gilda Crease, MD 08/24/19 [phone]

## 2019-08-24 NOTE — ED Provider Notes (Signed)
Physical Exam  BP 117/74   Pulse 60   Temp 98.2 F (36.8 C) (Oral)   Resp 13   Ht 5\' 9"  (1.753 m)   Wt 75 kg   SpO2 100%   BMI 24.42 kg/m   Physical Exam HENT:     Head: Atraumatic.  Cardiovascular:     Rate and Rhythm: Normal rate and regular rhythm.     Pulses: Normal pulses.  Pulmonary:     Effort: Pulmonary effort is normal.     Breath sounds: Normal breath sounds.  Musculoskeletal:     Right shoulder: Deformity present. Decreased range of motion.  Neurological:     Mental Status: He is alert.     ED Course/Procedures     .Sedation  Date/Time: 08/24/2019 3:40 AM Performed by: 10/25/2019, MD Authorized by: Gilda Crease, MD   Consent:    Consent obtained:  Verbal   Consent given by:  Patient   Risks discussed:  Allergic reaction, dysrhythmia, inadequate sedation, nausea, prolonged hypoxia resulting in organ damage, prolonged sedation necessitating reversal, respiratory compromise necessitating ventilatory assistance and intubation and vomiting   Alternatives discussed:  Analgesia without sedation, anxiolysis and regional anesthesia Universal protocol:    Procedure explained and questions answered to patient or proxy's satisfaction: yes     Relevant documents present and verified: yes     Test results available and properly labeled: yes     Imaging studies available: yes     Required blood products, implants, devices, and special equipment available: yes     Site/side marked: yes     Immediately prior to procedure a time out was called: yes     Patient identity confirmation method:  Verbally with patient Indications:    Procedure necessitating sedation performed by:  Physician performing sedation Pre-sedation assessment:    Time since last food or drink:  8   ASA classification: class 1 - normal, healthy patient     Neck mobility: normal     Mouth opening:  3 or more finger widths   Thyromental distance:  4 finger widths   Mallampati  score:  I - soft palate, uvula, fauces, pillars visible   Pre-sedation assessments completed and reviewed: airway patency, cardiovascular function, hydration status, mental status, nausea/vomiting, pain level, respiratory function and temperature   Immediate pre-procedure details:    Reassessment: Patient reassessed immediately prior to procedure     Reviewed: vital signs, relevant labs/tests and NPO status     Verified: bag valve mask available, emergency equipment available, intubation equipment available, IV patency confirmed, oxygen available and suction available   Procedure details (see MAR for exact dosages):    Preoxygenation:  Nasal cannula   Sedation:  Propofol   Intended level of sedation: deep   Intra-procedure monitoring:  Blood pressure monitoring, cardiac monitor, continuous pulse oximetry, frequent LOC assessments, frequent vital sign checks and continuous capnometry   Intra-procedure events: none     Total Provider sedation time (minutes):  15 Post-procedure details:    Attendance: Constant attendance by certified staff until patient recovered     Recovery: Patient returned to pre-procedure baseline     Post-sedation assessments completed and reviewed: airway patency, cardiovascular function, hydration status, mental status, nausea/vomiting, pain level, respiratory function and temperature     Patient is stable for discharge or admission: yes     Patient tolerance:  Tolerated well, no immediate complications    MDM  Patient presented with recurrent dislocation of right shoulder.  Patient underwent procedural sedation by myself followed by closed reduction of the dislocated shoulder by Harvie Heck, [name] under my direct supervision.  Please see her separate note for procedure.       Gilda Crease, MD 08/24/19 [phone]

## 2019-08-24 NOTE — ED Notes (Signed)
Patient ambulating and tolerating liquids

## 2019-08-24 NOTE — Discharge Instructions (Addendum)
You were seen in the emergency department today for a shoulder dislocation.  This was reduced in the ER, you are placed in a shoulder sling, please wear this at all times until you follow-up with orthopedics.  Please call to make an appointment within 1 week.  You had some blood work done which showed that your white blood cell count was mildly low, please get this rechecked by primary care provider within 1 month.  Return to the ER for new or worsening symptoms including but not limited to recurrence of dislocation, increased pain, numbness, weakness, change in the color of the arm, or any other concerns.

## 2019-09-05 ENCOUNTER — Ambulatory Visit (INDEPENDENT_AMBULATORY_CARE_PROVIDER_SITE_OTHER): Payer: 59 | Admitting: Orthopaedic Surgery

## 2019-09-05 ENCOUNTER — Encounter: Payer: Self-pay | Admitting: Orthopaedic Surgery

## 2019-09-05 DIAGNOSIS — M24411 Recurrent dislocation, right shoulder: Secondary | ICD-10-CM | POA: Diagnosis not present

## 2019-09-05 NOTE — Progress Notes (Signed)
Office Visit Note   Patient: Stephen [name]           Date of Birth: December 27, 1995           MRN: [id number] Visit Date: 09/05/2019              Requested by: Health, [facility] Floor [place],  [place] [address] PCP: Health, [facility]   Assessment & Plan: Visit Diagnoses:  1. Chronic dislocation of right shoulder     Plan: Impression is 2 weeks status post right shoulder dislocation and reduction.  Formal referral to outpatient PT was made today.  Activity limitations discussed in detail for the next 4 weeks.  Recheck in 4 weeks.  Follow-Up Instructions: Return in about 4 weeks (around 10/03/2019).   Orders:  Orders Placed This Encounter  Procedures  . Ambulatory referral to Physical Therapy   No orders of the defined types were placed in this encounter.     Procedures: No procedures performed   Clinical Data: No additional findings.   Subjective: Chief Complaint  Patient presents with  . Right Shoulder - Pain    Patient is a 24 year old gentleman follow-up from the ER for recent right shoulder dislocation on 08/23/2019 the ER put back in.  He has a history of seizures and has had 3 prior shoulder dislocations.  He has done formal physical therapy in the past but does not continue to do his home exercises.  He comes in today with his father and he is now wearing his sling.   Review of Systems  Constitutional: Negative.   All other systems reviewed and are negative.    Objective: Vital Signs: There were no vitals taken for this visit.  Physical Exam Vitals and nursing note reviewed.  Constitutional:      Appearance: He is well-developed.  HENT:     Head: Normocephalic and atraumatic.  Eyes:     Pupils: Pupils are equal, round, and reactive to light.  Pulmonary:     Effort: Pulmonary effort is normal.  Abdominal:     Palpations: Abdomen is soft.  Musculoskeletal:        General: Normal range of motion.      Cervical back: Neck supple.  Skin:    General: Skin is warm.  Neurological:     Mental Status: He is alert and oriented to person, place, and time.  Psychiatric:        Behavior: Behavior normal.        Thought Content: Thought content normal.        Judgment: Judgment normal.     Ortho Exam Right shoulder is neurovascular intact.  His range of motion is about 80% of normal at this point. Specialty Comments:  No specialty comments available.  Imaging: No results found.   PMFS History: Patient Active Problem List   Diagnosis Date Noted  . 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 Depression 12/21/2016  . Facial rash 10/26/2016  . Lethargy 10/26/2016  . Abnormal TSH 10/26/2016  . Mood changes 10/15/2016  . 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 Outbursts of anger 12/31/2015  . Encounter for long-term (current) use of high-risk medication 12/23/2014  . ODD (oppositional defiant disorder) 11/12/2014  . 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 MDD (major depressive disorder), recurrent episode, moderate (HCC) 11/12/2014  . 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 Seizure disorder (HCC) 08/31/2012   Past  Medical History:  Diagnosis Date  . 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 Depression   . Seizures (HCC)     Family History  Problem Relation Age of Onset  . Diabetes gravidarum Paternal Grandmother   . Heart attack Paternal Grandmother   . Healthy Mother   . Healthy Father     Past Surgical History:  Procedure Laterality Date  . CIRCUMCISION    . SHOULDER CLOSED REDUCTION Right 12/08/2018   Procedure: CLOSED REDUCTION SHOULDER;  Surgeon: Yolonda Kida, MD;  Location: [facility] OR;  Service: Orthopedics;  Laterality: Right;   Social History   Occupational History  . Occupation: Consulting civil engineer at [employer]  . Smoking status: Never Smoker  . Smokeless  tobacco: Never Used  Vaping Use  . Vaping Use: Every day  Substance and Sexual Activity  . Alcohol use: No  . Drug use: No  . Sexual activity: Never

## 2019-09-28 ENCOUNTER — Emergency Department (HOSPITAL_COMMUNITY)
Admission: EM | Admit: 2019-09-28 | Discharge: 2019-09-28 | Disposition: A | Discharge status: Home / Self Care | Payer: 59 | Attending: Emergency Medicine | Admitting: Emergency Medicine

## 2019-09-28 ENCOUNTER — Emergency Department (HOSPITAL_COMMUNITY): Payer: 59

## 2019-09-28 ENCOUNTER — Encounter (HOSPITAL_COMMUNITY): Payer: Self-pay

## 2019-09-28 ENCOUNTER — Other Ambulatory Visit: Payer: Self-pay

## 2019-09-28 DIAGNOSIS — S43006A Unspecified dislocation of unspecified shoulder joint, initial encounter: Secondary | ICD-10-CM

## 2019-09-28 DIAGNOSIS — S43004A Unspecified dislocation of right shoulder joint, initial encounter: Secondary | ICD-10-CM | POA: Diagnosis not present

## 2019-09-28 DIAGNOSIS — Y9389 Activity, other specified: Secondary | ICD-10-CM | POA: Insufficient documentation

## 2019-09-28 DIAGNOSIS — Y92002 Bathroom of unspecified non-institutional (private) residence single-family (private) house as the place of occurrence of the external cause: Secondary | ICD-10-CM | POA: Insufficient documentation

## 2019-09-28 DIAGNOSIS — W19XXXA Unspecified fall, initial encounter: Secondary | ICD-10-CM | POA: Diagnosis not present

## 2019-09-28 DIAGNOSIS — M25511 Pain in right shoulder: Secondary | ICD-10-CM

## 2019-09-28 DIAGNOSIS — Y999 Unspecified external cause status: Secondary | ICD-10-CM | POA: Diagnosis not present

## 2019-09-28 DIAGNOSIS — S4991XA Unspecified injury of right shoulder and upper arm, initial encounter: Secondary | ICD-10-CM | POA: Diagnosis present

## 2019-09-28 DIAGNOSIS — T148XXA Other injury of unspecified body region, initial encounter: Secondary | ICD-10-CM

## 2019-09-28 MED ORDER — PROPOFOL 10 MG/ML IV BOLUS
0.5000 mg/kg | Freq: Once | INTRAVENOUS | Status: DC
  Filled 2019-09-28: qty 20

## 2019-09-28 MED ORDER — PROPOFOL 10 MG/ML IV BOLUS
INTRAVENOUS | Status: AC | PRN
  Administered 2019-09-28 (×2): 40 mg via INTRAVENOUS

## 2019-09-28 MED ORDER — DIAZEPAM 5 MG/ML IJ SOLN
5.0000 mg | Freq: Once | INTRAMUSCULAR | Status: AC
  Administered 2019-09-28: 5 mg via INTRAVENOUS
  Filled 2019-09-28: qty 2

## 2019-09-28 MED ORDER — HYDROMORPHONE HCL 1 MG/ML IJ SOLN
0.5000 mg | Freq: Once | INTRAMUSCULAR | Status: AC
  Administered 2019-09-28: 0.5 mg via INTRAVENOUS
  Filled 2019-09-28: qty 1

## 2019-09-28 NOTE — Discharge Instructions (Addendum)
Follow up with Dr. Charlann Boxer for evaluation and treatment recurrent right shoulder dislocations.   Keep the shoulder immobilized with splint for the next 3-4 days or as directed by Dr. Charlann Boxer. Take Tylenol and/or ibuprofen for pain.

## 2019-09-28 NOTE — ED Provider Notes (Signed)
[facility]-EMERGENCY DEPT Provider Note   CSN: [id number] Arrival date & time: 09/28/19  3244     History Chief Complaint  Patient presents with  . Shoulder Pain    right shoulder     Stephen [name] is a 24 y.o. male.  Patient with history of seizures, recurrent right shoulder dislocation presents with pain c/w right shoulder dislocation. He reports reaching for something while in the bathroom and feeling sudden pain in the shoulder. No fall, seizure or other injury.   The history is provided by the patient. No language interpreter was used.  Shoulder Pain      Past Medical History:  Diagnosis Date  . 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 Depression   . Seizures [facility])     Patient Active Problem List   Diagnosis Date Noted  . 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 Depression 12/21/2016  . Facial rash 10/26/2016  . Lethargy 10/26/2016  . Abnormal TSH 10/26/2016  . Mood changes 10/15/2016  . 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 Outbursts of anger 12/31/2015  . Encounter for long-term (current) use of high-risk medication 12/23/2014  . ODD (oppositional defiant disorder) 11/12/2014  . 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 MDD (major depressive disorder), recurrent episode, moderate (HCC) 11/12/2014  . 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 Seizure disorder (HCC) 08/31/2012    Past Surgical History:  Procedure Laterality Date  . CIRCUMCISION    . SHOULDER CLOSED REDUCTION Right 12/08/2018   Procedure: CLOSED REDUCTION SHOULDER;  Surgeon: Yolonda Kida, MD;  Location: [facility] OR;  Service: Orthopedics;  Laterality: Right;       Family History  Problem Relation Age of Onset  . Diabetes gravidarum Paternal Grandmother   . Heart attack Paternal Grandmother   . Healthy Mother   . Healthy Father     Social History    Tobacco Use  . Smoking status: Never Smoker  . Smokeless tobacco: Never Used  Vaping Use  . Vaping Use: Every day  Substance Use Topics  . Alcohol use: No  . Drug use: No    Home Medications Prior to Admission medications   Medication Sig Start Date End Date Taking? Authorizing Provider  citalopram (CELEXA) 10 MG tablet Take 10 mg by mouth daily. 03/27/19   [provider]  lamoTRIgine (LAMICTAL) 25 MG tablet Take 25 mg by mouth daily.  04/04/19   [provider]    Allergies    Lactose and Lactose intolerance (gi)  Review of Systems   Review of Systems  Constitutional: Negative for diaphoresis.  Musculoskeletal:       Right shoulder pain and deformity  Skin: Negative for color change and wound.  Neurological: Negative for numbness.    Physical Exam Updated Vital Signs BP 132/90 (BP Location: Left Arm)   Pulse 82   Temp 98.4 F (36.9 C) (Oral)   Resp 16   Ht 5\' 9"  (1.753 m)   Wt 75 kg   SpO2 99%   BMI 24.42 kg/m   Physical Exam Vitals and nursing note reviewed.  Constitutional:      General: He is not in acute distress. Cardiovascular:     Rate and Rhythm: Normal rate and regular rhythm.  Pulmonary:     Breath sounds: No wheezing, rhonchi or rales.  Chest:     Chest wall: No tenderness.  Musculoskeletal:     Comments: Right  shoulder deformity c/w anterior dislocation. Distal pulses intact. No cervical tenderness.   Skin:    General: Skin is warm and dry.     Comments: No wound. No erythema  Neurological:     Mental Status: He is alert.     Sensory: No sensory deficit.     ED Results / Procedures / Treatments   Labs (all labs ordered are listed, but only abnormal results are displayed) Labs Reviewed - No data to display  EKG None  Radiology No results found.  Procedures Reduction of dislocation  Date/Time: 09/28/2019 6:39 AM Performed by: Elpidio Anis, [name] Authorized by: Elpidio Anis, [name]   Sedation: Patient  sedated: yes Sedation type: moderate (conscious) sedation Sedatives: propofol Analgesia: hydromorphone Vitals: Vital signs were monitored during sedation.  Comments: Traction/counter-traction, external rotation method with successful reduction of joint.     (including critical care time)  Medications Ordered in ED Medications  diazepam (VALIUM) injection 5 mg (has no administration in time range)  HYDROmorphone (DILAUDID) injection 0.5 mg (has no administration in time range)    ED Course  I have reviewed the triage vital signs and the nursing notes.  Pertinent labs & imaging results that were available during my care of the patient were reviewed by me and considered in my medical decision making (see chart for details).    MDM Rules/Calculators/A&P                          Patient to ED with recurrent anterior right shoulder dislocation.   Conscious sedation required for reduction. See Dr. Randel Books note.   Post-reduction film confirms successful reduction. Immobilizer in place.   Final Clinical Impression(s) / ED Diagnoses Final diagnoses:  None   1. Right Shoulder Dislocation  Rx / DC Orders ED Discharge Orders    None       Elpidio Anis, [name] 09/28/19 [id number]    Glynn Octave, MD 09/28/19 [phone]

## 2019-09-28 NOTE — ED Provider Notes (Signed)
Recurrent right shoulder dislocation. No direct trauma. Neurovascular intact. Physical Exam  BP 119/90 (BP Location: Left Arm)   Pulse 60   Temp 97.7 F (36.5 C) (Oral)   Resp 15   Ht 5\' 9"  (1.753 m)   Wt 75 kg   SpO2 100%   BMI 24.42 kg/m   ED Course/Procedures     .Sedation  Date/Time: 09/28/2019 4:43 AM Performed by: 09/30/2019, MD Authorized by: Glynn Octave, MD   Consent:    Consent obtained:  Verbal   Consent given by:  Patient   Risks discussed:  Allergic reaction, dysrhythmia, inadequate sedation, nausea, prolonged hypoxia resulting in organ damage, prolonged sedation necessitating reversal, respiratory compromise necessitating ventilatory assistance and intubation and vomiting   Alternatives discussed:  Analgesia without sedation, anxiolysis and regional anesthesia Universal protocol:    Procedure explained and questions answered to patient or proxy's satisfaction: yes     Relevant documents present and verified: yes     Test results available and properly labeled: yes     Imaging studies available: yes     Required blood products, implants, devices, and special equipment available: yes     Site/side marked: yes     Immediately prior to procedure a time out was called: yes     Patient identity confirmation method:  Verbally with patient Indications:    Procedure performed:  Dislocation reduction   Procedure necessitating sedation performed by:  Physician performing sedation Pre-sedation assessment:    Time since last food or drink:  6   ASA classification: class 1 - normal, healthy patient     Neck mobility: normal     Mouth opening:  3 or more finger widths   Thyromental distance:  4 finger widths   Mallampati score:  I - soft palate, uvula, fauces, pillars visible   Pre-sedation assessments completed and reviewed: airway patency, cardiovascular function, hydration status, mental status, nausea/vomiting, pain level, respiratory function and temperature      Pre-sedation assessment completed:  09/28/2019 4:22 AM Immediate pre-procedure details:    Reassessment: Patient reassessed immediately prior to procedure     Reviewed: vital signs, relevant labs/tests and NPO status     Verified: bag valve mask available, emergency equipment available, intubation equipment available, IV patency confirmed, oxygen available and suction available   Procedure details (see MAR for exact dosages):    Preoxygenation:  Nasal cannula   Sedation:  Propofol   Intended level of sedation: deep   Analgesia:  Hydromorphone   Intra-procedure monitoring:  Blood pressure monitoring, cardiac monitor, continuous pulse oximetry, frequent LOC assessments, frequent vital sign checks and continuous capnometry   Intra-procedure events: none     Total Provider sedation time (minutes):  15 Post-procedure details:    Post-sedation assessment completed:  09/28/2019 4:44 AM   Attendance: Constant attendance by certified staff until patient recovered     Recovery: Patient returned to pre-procedure baseline     Post-sedation assessments completed and reviewed: airway patency, cardiovascular function, hydration status, mental status, nausea/vomiting, pain level, respiratory function and temperature     Patient is stable for discharge or admission: yes     Patient tolerance:  Tolerated well, no immediate complications    MDM  Recurrent right shoulder dislocation. Sedation performed as above.  Reduction by [name] Upstill.      09/30/2019, MD 09/28/19 [phone]

## 2019-09-28 NOTE — ED Notes (Signed)
PA at bedside.

## 2019-09-28 NOTE — ED Triage Notes (Signed)
Patient brought in via [facility] for recurrent right shoulder "dislocation". Patient states he has been seen several times for this.

## 2019-10-09 ENCOUNTER — Ambulatory Visit: Payer: 59 | Admitting: Orthopaedic Surgery

## 2019-10-18 ENCOUNTER — Encounter (HOSPITAL_COMMUNITY): Payer: Self-pay | Admitting: Pediatrics

## 2019-10-18 ENCOUNTER — Other Ambulatory Visit: Payer: Self-pay

## 2019-10-18 ENCOUNTER — Emergency Department (HOSPITAL_COMMUNITY): Payer: 59

## 2019-10-18 ENCOUNTER — Emergency Department (HOSPITAL_COMMUNITY)
Admission: EM | Admit: 2019-10-18 | Discharge: 2019-10-19 | Disposition: A | Discharge status: Home / Self Care | Payer: 59 | Attending: Emergency Medicine | Admitting: Emergency Medicine

## 2019-10-18 DIAGNOSIS — Y999 Unspecified external cause status: Secondary | ICD-10-CM | POA: Diagnosis not present

## 2019-10-18 DIAGNOSIS — Y929 Unspecified place or not applicable: Secondary | ICD-10-CM | POA: Diagnosis not present

## 2019-10-18 DIAGNOSIS — Y939 Activity, unspecified: Secondary | ICD-10-CM | POA: Diagnosis not present

## 2019-10-18 DIAGNOSIS — X58XXXA Exposure to other specified factors, initial encounter: Secondary | ICD-10-CM | POA: Insufficient documentation

## 2019-10-18 DIAGNOSIS — M24411 Recurrent dislocation, right shoulder: Secondary | ICD-10-CM

## 2019-10-18 DIAGNOSIS — S43004A Unspecified dislocation of right shoulder joint, initial encounter: Secondary | ICD-10-CM | POA: Diagnosis present

## 2019-10-18 NOTE — ED Triage Notes (Signed)
Patient concern for shoulder dislocation; stated happens before spontaneously.

## 2019-10-19 ENCOUNTER — Emergency Department (HOSPITAL_COMMUNITY): Payer: 59

## 2019-10-19 MED ORDER — KETAMINE HCL 50 MG/5ML IJ SOSY
40.0000 mg | PREFILLED_SYRINGE | Freq: Once | INTRAMUSCULAR | Status: DC
  Filled 2019-10-19: qty 5

## 2019-10-19 MED ORDER — KETAMINE HCL 10 MG/ML IJ SOLN
INTRAMUSCULAR | Status: AC | PRN
  Administered 2019-10-19: 10 mg via INTRAVENOUS
  Administered 2019-10-19: 20 mg via INTRAVENOUS

## 2019-10-19 MED ORDER — PROPOFOL 10 MG/ML IV BOLUS
INTRAVENOUS | Status: AC | PRN
  Administered 2019-10-19: 20 mg via INTRAVENOUS
  Administered 2019-10-19: 30 mg via INTRAVENOUS
  Administered 2019-10-19: 20 mg via INTRAVENOUS

## 2019-10-19 MED ORDER — PROPOFOL 10 MG/ML IV BOLUS
60.0000 mg | Freq: Once | INTRAVENOUS | Status: DC
  Filled 2019-10-19: qty 20

## 2019-10-19 NOTE — ED Provider Notes (Signed)
[facility] EMERGENCY DEPARTMENT Provider Note  CSN: [id number] Arrival date & time: 10/18/19 1856  Chief Complaint(s) Shoulder Pain  HPI Stephen [name] is a 24 y.o. male with a past medical history listed below including recurrent right shoulder dislocations.  Patient presents today for recurrence of his right shoulder dislocation that occurred spontaneously.  He reports that this occurred approximately 1 to 2 hours prior to arrival, which is now approximately 12 hours given his prolonged wait time.  Reports that it occurred while he was trying to wipe himself after having a bowel movement.  He denies any falls or traumas.  Patient has not attempted to relocate it on his own.  He is endorsing right shoulder pain.  Still was able to use the arm and hand.  No numbness or tingling.  No other physical complaints.  HPI  Past Medical History Past Medical History:  Diagnosis Date  . 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 Depression   . Seizures [facility])    Patient Active Problem List   Diagnosis Date Noted  . 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 Depression 12/21/2016  . Facial rash 10/26/2016  . Lethargy 10/26/2016  . Abnormal TSH 10/26/2016  . Mood changes 10/15/2016  . 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 Outbursts of anger 12/31/2015  . Encounter for long-term (current) use of high-risk medication 12/23/2014  . ODD (oppositional defiant disorder) 11/12/2014  . 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 MDD (major depressive disorder), recurrent episode, moderate (HCC) 11/12/2014  . 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 Seizure disorder (HCC) 08/31/2012   Home Medication(s) Prior to Admission medications   Medication Sig Start Date End Date Taking? Authorizing Provider  citalopram (CELEXA) 10 MG tablet Take 10 mg by mouth daily. 03/27/19   [provider]  lamoTRIgine (LAMICTAL) 25 MG tablet Take 25 mg by mouth daily.  04/04/19   [provider]                                                                                                                                    Past Surgical History Past Surgical History:  Procedure Laterality Date  . CIRCUMCISION    . SHOULDER CLOSED REDUCTION Right 12/08/2018   Procedure: CLOSED REDUCTION SHOULDER;  Surgeon: Yolonda Kida, MD;  Location: [facility] OR;  Service: Orthopedics;  Laterality: Right;   Family History Family History  Problem Relation Age of Onset  . Diabetes gravidarum Paternal Grandmother   . Heart attack Paternal Grandmother   . Healthy Mother   . Healthy Father     Social History Social History   Tobacco Use  . Smoking status: Never Smoker  . Smokeless tobacco: Never Used  Vaping Use  . Vaping Use: Every day  Substance Use Topics  . Alcohol use: No  . Drug use: No  Allergies Lactose and Lactose intolerance (gi)  Review of Systems Review of Systems All other systems are reviewed and are negative for acute change except as noted in the HPI  Physical Exam Vital Signs  I have reviewed the triage vital signs BP 123/80   Pulse 94   Temp 98.2 F (36.8 C)   Resp 13   SpO2 100%   Physical Exam Vitals reviewed.  Constitutional:      General: He is not in acute distress.    Appearance: He is well-developed. He is not diaphoretic.  HENT:     Head: Normocephalic and atraumatic.     Jaw: No trismus.     Right Ear: External ear normal.     Left Ear: External ear normal.     Nose: Nose normal.  Eyes:     General: No scleral icterus.    Conjunctiva/sclera: Conjunctivae normal.  Neck:     Trachea: Phonation normal.  Cardiovascular:     Rate and Rhythm: Normal rate and regular rhythm.  Pulmonary:     Effort: Pulmonary effort is normal. No respiratory distress.     Breath sounds: No stridor.  Abdominal:     General: There is no  distension.  Musculoskeletal:     Right shoulder: Deformity and bony tenderness present. No tenderness. Decreased range of motion. Normal strength. Normal pulse.     Cervical back: Normal range of motion.  Neurological:     Mental Status: He is alert and oriented to person, place, and time.  Psychiatric:        Behavior: Behavior normal.     ED Results and Treatments Labs (all labs ordered are listed, but only abnormal results are displayed) Labs Reviewed - No data to display                                                                                                                       EKG  EKG Interpretation  Date/Time:    Ventricular Rate:    PR Interval:    QRS Duration:   QT Interval:    QTC Calculation:   R Axis:     Text Interpretation:        Radiology DG Shoulder Right  Result Date: 10/18/2019 CLINICAL DATA:  Right shoulder dislocation, pain EXAM: RIGHT SHOULDER - 2+ VIEW COMPARISON:  09/28/2019 FINDINGS: Frontal and transscapular views of the right shoulder demonstrate anterior glenohumeral dislocation. No displaced fracture. Right chest is clear. IMPRESSION: 1. Anterior glenohumeral dislocation. Electronically Signed   By: Sharlet SalinaMichael  Brown M.D.   On: 10/18/2019 19:59    Pertinent labs & imaging results that were available during my care of the patient were reviewed by me and considered in my medical decision making (see chart for details).  Medications Ordered in ED Medications  propofol (DIPRIVAN) 10 mg/mL bolus/IV push 60 mg (0 mg Intravenous Hold 10/19/19 0619)  ketamine 50 mg in normal saline 5 mL (10 mg/mL) syringe (0 mg Intravenous Hold 10/19/19 0620)  propofol (DIPRIVAN) 10 mg/mL bolus/IV push (20 mg Intravenous Given 10/19/19 0615)  ketamine (KETALAR) injection (20 mg Intravenous Given 10/19/19 0614)                                                                                                                                    Procedures .Ortho Injury  Treatment  Date/Time: 10/19/2019 6:41 AM Performed by: Nira Conn, MD Authorized by: Nira Conn, MD   Consent:    Consent obtained:  Written   Consent given by:  Patient   Risks discussed:  Irreducible dislocation, recurrent dislocation, stiffness and restricted joint movement   Alternatives discussed:  No treatmentInjury location: shoulder Location details: right shoulder Injury type: dislocation Dislocation type: anterior Chronicity: recurrent Pre-procedure neurovascular assessment: neurovascularly intact  Patient sedated: Yes. Refer to sedation procedure documentation for details of sedation. Manipulation performed: yes Reduction method: external rotation and traction and counter traction Reduction successful: yes X-ray confirmed reduction: yes Immobilization: sling Post-procedure neurovascular assessment: post-procedure neurovascularly intact Patient tolerance: patient tolerated the procedure well with no immediate complications  .Sedation  Date/Time: 10/19/2019 6:41 AM Performed by: Nira Conn, MD Authorized by: Nira Conn, MD   Consent:    Consent obtained:  Verbal   Consent given by:  Patient   Risks discussed:  Allergic reaction, dysrhythmia, inadequate sedation, nausea, prolonged hypoxia resulting in organ damage, prolonged sedation necessitating reversal, respiratory compromise necessitating ventilatory assistance and intubation and vomiting   Alternatives discussed:  Analgesia without sedation, anxiolysis and regional anesthesia Universal protocol:    Procedure explained and questions answered to patient or proxy's satisfaction: yes     Relevant documents present and verified: yes     Test results available and properly labeled: yes     Imaging studies available: yes     Required blood products, implants, devices, and special equipment available: yes     Site/side marked: yes     Immediately prior to procedure a time  out was called: yes     Patient identity confirmation method:  Verbally with patient Indications:    Procedure necessitating sedation performed by:  Physician performing sedation Pre-sedation assessment:    Time since last food or drink:  2000   ASA classification: class 1 - normal, healthy patient     Neck mobility: normal     Mouth opening:  3 or more finger widths   Thyromental distance:  4 finger widths   Mallampati score:  I - soft palate, uvula, fauces, pillars visible   Pre-sedation assessments completed and reviewed: airway patency, cardiovascular function, hydration status, mental status, nausea/vomiting, pain level, respiratory function and temperature     Pre-sedation assessment completed:  10/19/2019 5:59 AM Immediate pre-procedure details:    Reassessment: Patient reassessed immediately prior to procedure     Reviewed: vital signs, relevant labs/tests and NPO status     Verified: bag valve mask available, emergency equipment available,  intubation equipment available, IV patency confirmed, oxygen available and suction available   Procedure details (see MAR for exact dosages):    Preoxygenation:  Nasal cannula   Sedation:  Propofol   Intended level of sedation: deep   Intra-procedure monitoring:  Blood pressure monitoring, cardiac monitor, continuous pulse oximetry, frequent LOC assessments, frequent vital sign checks and continuous capnometry   Intra-procedure events: none     Total Provider sedation time (minutes):  15 Post-procedure details:    Post-sedation assessment completed:  10/19/2019 6:42 AM   Attendance: Constant attendance by certified staff until patient recovered     Recovery: Patient returned to pre-procedure baseline     Post-sedation assessments completed and reviewed: airway patency, cardiovascular function, hydration status, mental status, nausea/vomiting, pain level, respiratory function and temperature     Patient is stable for discharge or admission: yes      Patient tolerance:  Tolerated well, no immediate complications    (including critical care time)  Medical Decision Making / ED Course I have reviewed the nursing notes for this encounter and the patient's prior records (if available in EHR or on provided paperwork).   Stephen [name] was evaluated in Emergency Department on 10/19/2019 for the symptoms described in the history of present illness. He was evaluated in the context of the global COVID-19 pandemic, which necessitated consideration that the patient might be at risk for infection with the SARS-CoV-2 virus that causes COVID-19. Institutional protocols and algorithms that pertain to the evaluation of patients at risk for COVID-19 are in a state of rapid change based on information released by regulatory bodies including the CDC and federal and state organizations. These policies and algorithms were followed during the patient's care in the ED.  Right shoulder dislocation confirmed by myself.  Reduced under conscious sedation as noted above.  Placed in sling.  Reinforced orthopedic follow-up previously recommended.      Final Clinical Impression(s) / ED Diagnoses Final diagnoses:  Recurrent dislocation of right shoulder   The patient appears reasonably screened and/or stabilized for discharge and I doubt any other medical condition or other [facility] requiring further screening, evaluation, or treatment in the ED at this time prior to discharge. Safe for discharge with strict return precautions.  Disposition: Discharge  Condition: Good  I have discussed the results, Dx and Tx plan with the patient/family who expressed understanding and agree(s) with the plan. Discharge instructions discussed at length. The patient/family was given strict return precautions who verbalized understanding of the instructions. No further questions at time of discharge.    ED Discharge Orders    None        Follow Up: Orthopedic  surgery         This chart was dictated using voice recognition software.  Despite best efforts to proofread,  errors can occur which can change the documentation meaning.   Nira Conn, MD 10/19/19 [phone]

## 2019-10-19 NOTE — ED Notes (Signed)
Pt trembling, warm blanket applied, blood pressure reading high

## 2019-10-19 NOTE — Progress Notes (Signed)
Orthopedic Tech Progress Note Patient Details:  Stephen [name] 1995-05-16 [id number] Doctor applied sling after reduction  Ortho Devices Type of Ortho Device: Arm sling Ortho Device/Splint Location: RUE Ortho Device/Splint Interventions: Application, Adjustment   Post Interventions Patient Tolerated: Well   [name] [name] 10/19/2019, 6:28 AM

## 2019-11-16 ENCOUNTER — Other Ambulatory Visit: Payer: Self-pay

## 2019-11-16 ENCOUNTER — Encounter (HOSPITAL_COMMUNITY): Payer: Self-pay | Admitting: Emergency Medicine

## 2019-11-16 ENCOUNTER — Emergency Department (HOSPITAL_COMMUNITY)
Admission: EM | Admit: 2019-11-16 | Discharge: 2019-11-16 | Disposition: A | Discharge status: Home / Self Care | Payer: 59 | Attending: Emergency Medicine | Admitting: Emergency Medicine

## 2019-11-16 DIAGNOSIS — F419 Anxiety disorder, unspecified: Secondary | ICD-10-CM | POA: Diagnosis present

## 2019-11-16 DIAGNOSIS — F1290 Cannabis use, unspecified, uncomplicated: Secondary | ICD-10-CM | POA: Diagnosis not present

## 2019-11-16 DIAGNOSIS — F22 Delusional disorders: Secondary | ICD-10-CM | POA: Insufficient documentation

## 2019-11-16 DIAGNOSIS — Z79899 Other long term (current) drug therapy: Secondary | ICD-10-CM | POA: Insufficient documentation

## 2019-11-16 DIAGNOSIS — R251 Tremor, unspecified: Secondary | ICD-10-CM | POA: Diagnosis not present

## 2019-11-16 NOTE — Discharge Instructions (Addendum)
Refrain from using illicit substances.   Make sure to drink water and stay hydrated throughout the day today.   Follow-up with your primary care doctor. Return here for new concerns.

## 2019-11-16 NOTE — ED Triage Notes (Signed)
Patient here from home reporting anxiety after smoking "delta 8", synthetic marijuana.

## 2019-11-16 NOTE — ED Provider Notes (Signed)
[facility]-EMERGENCY DEPT Provider Note   CSN: [id number] Arrival date & time: 11/16/19  0503     History Chief Complaint  Patient presents with  . Anxiety    Stephen [name] is a 24 y.o. male.  The history is provided by the patient and medical records.  Anxiety   24 y.o. M with hx of anxiety, depression, seizure disorder, presenting to the ED for anxiety.  States he was with friends tonight and smoked some delta 8 marijuana for the first time.  States he was having an "out of body" experience and felt very paranoid.  States he felt like he was trembling.  No seizure activity.  No nausea/vomiting.  States he does have dry mouth.  States symptoms have started to resolve but does not feel completely back to normal yet.  Past Medical History:  Diagnosis Date  . 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 Depression   . Seizures [facility])     Patient Active Problem List   Diagnosis Date Noted  . 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 Depression 12/21/2016  . Facial rash 10/26/2016  . Lethargy 10/26/2016  . Abnormal TSH 10/26/2016  . Mood changes 10/15/2016  . 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 Outbursts of anger 12/31/2015  . Encounter for long-term (current) use of high-risk medication 12/23/2014  . ODD (oppositional defiant disorder) 11/12/2014  . 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 MDD (major depressive disorder), recurrent episode, moderate (HCC) 11/12/2014  . 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 Seizure disorder (HCC) 08/31/2012    Past Surgical History:  Procedure Laterality Date  . CIRCUMCISION    . SHOULDER CLOSED REDUCTION Right 12/08/2018   Procedure: CLOSED REDUCTION SHOULDER;  Surgeon: Yolonda Kida, MD;  Location: [facility] OR;  Service: Orthopedics;  Laterality: Right;       Family History  Problem Relation Age of  Onset  . Diabetes gravidarum Paternal Grandmother   . Heart attack Paternal Grandmother   . Healthy Mother   . Healthy Father     Social History   Tobacco Use  . Smoking status: Never Smoker  . Smokeless tobacco: Never Used  Vaping Use  . Vaping Use: Every day  Substance Use Topics  . Alcohol use: No  . Drug use: No    Home Medications Prior to Admission medications   Medication Sig Start Date End Date Taking? Authorizing Provider  citalopram (CELEXA) 10 MG tablet Take 10 mg by mouth daily. 03/27/19   [provider]  lamoTRIgine (LAMICTAL) 25 MG tablet Take 25 mg by mouth daily.  04/04/19   [provider]    Allergies    Lactose and Lactose intolerance (gi)  Review of Systems   Review of Systems  Psychiatric/Behavioral: The patient is hyperactive.   All other systems reviewed and are negative.   Physical Exam Updated Vital Signs BP 127/81 (BP Location: Left Arm)   Pulse (!) 107   Temp 98 F (36.7 C) (Oral)   Resp 14   Ht 5\' 9"  (1.753 m)   Wt 68 kg   SpO2 97%   BMI 22.15 kg/m   Physical Exam Vitals and nursing note reviewed.  Constitutional:      Appearance: He is well-developed.  HENT:     Head: Normocephalic and atraumatic.     Right Ear: There is no impacted cerumen.     Left Ear: There is no  impacted cerumen.  Eyes:     Conjunctiva/sclera: Conjunctivae normal.     Pupils: Pupils are equal, round, and reactive to light.  Cardiovascular:     Rate and Rhythm: Normal rate and regular rhythm.     Heart sounds: Normal heart sounds.  Pulmonary:     Effort: Pulmonary effort is normal. No respiratory distress.     Breath sounds: Normal breath sounds. No rhonchi.  Abdominal:     General: Bowel sounds are normal.     Palpations: Abdomen is soft.     Tenderness: There is no abdominal tenderness. There is no rebound.  Musculoskeletal:        General: Normal range of motion.     Cervical back: Normal range of motion.  Skin:    General:  Skin is warm and dry.     Findings: No rash.  Neurological:     Mental Status: He is alert and oriented to person, place, and time.     ED Results / Procedures / Treatments   Labs (all labs ordered are listed, but only abnormal results are displayed) Labs Reviewed - No data to display  EKG None  Radiology No results found.  Procedures Procedures (including critical care time)  Medications Ordered in ED Medications - No data to display  ED Course  I have reviewed the triage vital signs and the nursing notes.  Pertinent labs & imaging results that were available during my care of the patient were reviewed by me and considered in my medical decision making (see chart for details).    MDM Rules/Calculators/A&P  24 year old male presenting to the ED with anxiety.  Smoked delta 8 marijuana with friends for the first time and felt body trembling, anxiety, and paranoia.  Symptoms have started to resolve but does not feel completely back to normal.  He is alert and oriented here, moving arms and legs well.  No tremors or seizure activity noted.  He does complain of dry mouth.  Patient was able to tolerate water without difficulty.  He remains ambulatory with steady gait.  He does not appear impaired.  Feel he stable for discharge home.  We have discussed refraining from illicit drug use.  Can follow-up with PCP.  He may return here for any new or acute changes.  Final Clinical Impression(s) / ED Diagnoses Final diagnoses:  Marijuana use    Rx / DC Orders ED Discharge Orders    None       Garlon Hatchet, [name] 11/16/19 0539    Derwood Kaplan, MD 11/16/19 [phone]

## 2019-11-29 ENCOUNTER — Encounter: Payer: Self-pay | Admitting: Neurology

## 2019-11-29 ENCOUNTER — Ambulatory Visit (INDEPENDENT_AMBULATORY_CARE_PROVIDER_SITE_OTHER): Payer: 59 | Admitting: Neurology

## 2019-11-29 ENCOUNTER — Other Ambulatory Visit: Payer: Self-pay

## 2019-11-29 VITALS — BP 142/81 | HR 75 | Ht 69.0 in | Wt 158.4 lb

## 2019-11-29 DIAGNOSIS — F419 Anxiety disorder, unspecified: Secondary | ICD-10-CM | POA: Diagnosis not present

## 2019-11-29 DIAGNOSIS — G40309 Generalized idiopathic epilepsy and epileptic syndromes, not intractable, without status epilepticus: Secondary | ICD-10-CM | POA: Diagnosis not present

## 2019-11-29 MED ORDER — TRAZODONE HCL 50 MG PO TABS: 50.0000 mg | ORAL_TABLET | Freq: Every day | ORAL | 11 refills | Status: DC

## 2019-11-29 MED ORDER — LAMOTRIGINE 100 MG PO TABS: ORAL_TABLET | ORAL | 3 refills | Status: DC

## 2019-11-29 NOTE — Patient Instructions (Signed)
1. Start Trazodone 50mg : Take 1 tablet every night to help with sleep  2. Continue Lamotrigine 100mg : Take 1/2 tablet every night  3. Ok to continue allergy medicine  4. Referral will be sent to Psychiatrist and therapist. You can contact them at [phone] for follow-up on referral.  5. Follow-up in 6 months, call for any changes   Seizure Precautions: 1. If medication has been prescribed for you to prevent seizures, take it exactly as directed.  Do not stop taking the medicine without talking to your doctor first, even if you have not had a seizure in a long time.   2. Avoid activities in which a seizure would cause danger to yourself or to others.  Don't operate dangerous machinery, swim alone, or climb in high or dangerous places, such as on ladders, roofs, or girders.  Do not drive unless your doctor says you may.  3. If you have any warning that you may have a seizure, lay down in a safe place where you can't hurt yourself.    4.  No driving for 6 months from last seizure, as per [facility].   Please refer to the following link on the [employer]'s website for more information: http://www.epilepsyfoundation.org/answerplace/Social/driving/drivingu.cfm   5.  Maintain good sleep hygiene. Avoid alcohol.  6.  Contact your doctor if you have any problems that may be related to the medicine you are taking.  7.  Call 911 and bring the patient back to the ED if:        A.  The seizure lasts longer than 5 minutes.       B.  The patient doesn't awaken shortly after the seizure  C.  The patient has new problems such as difficulty seeing, speaking or moving  D.  The patient was injured during the seizure  E.  The patient has a temperature over 102 F (39C)  F.  The patient vomited and now is having trouble breathing

## 2019-11-29 NOTE — Progress Notes (Signed)
NEUROLOGY CONSULTATION NOTE  Hendrix Yurkovich MRN: [id number] DOB: 09-Sep-1995  Referring provider: Dr. Mackie Pai Primary care provider: none listed  Reason for consult:  Establish care for seizures  Dear Dr Lyn Hollingshead:  Thank you for your kind referral of Antinio Sanderfer for consultation of the above symptoms. Although his history is well known to you, please allow me to reiterate it for the purpose of our medical record. The patient was accompanied to the clinic by his father who also provides collateral information. Records and images were personally reviewed where available.   HISTORY OF PRESENT ILLNESS: This is a 24 year old right-handed man with a history of seizures since 2014. He was previously seeing pediatric neurologist Dr. Devonne Doughty, he was also seen one time by Dr. Terrace Arabia and Dr. Lyn Hollingshead at [facility]. Records were reviewed and will be summarized below. Seizures started in 2014, he would usually feel his hands start shaking and get a feeling something is going to happen, then he loses consciousness and wakes up on the ground. His father describes generalized tonic-clonic seizures. He has dislocated his right shoulder in 11/2018 from a seizure. His father reports most of the seizures occurred at 3am in the bathroom. No nocturnal seizures. He has had 3 EEGs over the years (2014, 2015, 2016) showing generalized discharges. His last EEG in 2017 was unremarkable. MRI brain in 2019 normal. He was initially on Levetiracetam which caused behavioral and mood issues. He was switched to Depakote in 2017. He was admitted to inpatient psychiatry after an episode of behavioral outburst with overdose of several medications. Depakote was discontinued for a time, then restarted, however his father stopped it because it was causing him to get mad. He was started on Lamotrigine by Dr. Terrace Arabia in 2018. He was seen by Dr. Lyn Hollingshead in 02/2019, he was on Lamotrigine 100mg  qhs and was instructed to increase  to 100mg  BID. He was also started on citalopram 10mg  daily for anxiety, anger, possible OCD behavior. His father manages his medications and states that when taking a full pill off Lamotrigine, Tiara has anger issues. He has only been giving him 1/2 tab qhs (50mg  qhs) and they both report that he has not had any convulsions since 11/2018. He reports occasional morning myoclonus. He admits to brief gaps in time occurring every other day, his father states that he is sometimes staring and not answering, but feels this occurs when he has too much cigarettes. He denies any olfactory/gustatory hallucinations, deja vu, rising epigastric sensation, focal numbness/tingling/weakness. He denies any headaches, diplopia, dysarthria/dysphagia, neck/back pain, bowel/bladder dysfunction. He feels a little dizzy sometimes, his father again thinks this is due to cigarettes. He does not sleep well, sometimes he is up all night and only takes brief naps during the day. He says he just can't go to sleep. His father reports that he is "excessive," "busy all night." He asks too much questions. He takes a long time in the bathroom, sometimes in the bathroom for an hour. His father wants to stop the citalopram, he has only been giving it every other night and says there is no result from it. His father gives him allergy medicine which he feels would help with sleep, however he is still up all night. His father expressed frustration that he was unable to go to college, his father brought him 2-3 years ago but he did not go. His father tried to get him employment but he was fired after 3 days. He states he was  in special education classes in school, that he was in [place].   Epilepsy Risk Factors:  He had a normal birth and early development.  There is no history of febrile convulsions, CNS infections such as meningitis/encephalitis, significant traumatic brain injury, neurosurgical procedures, or family history of seizures.  Prior AEDs:  Levetiracetam, Depakote EEGs:  EEG in 08/2012 abnormal due to a few single generalized sharp waves EEG in 04/2013 abnormal due to a few episodes of single generalized sharps as well as sporadic multifocal sharp wave activity, consistent with a generalized seizure disorder EEG in 04/2014 abnormal due to occasional generalized discharges EEG in 04/2015 unremarkable, slight hypersynchrony and occasionally sharply contoured waves during HV and PS  MRI: MRI brain with and without contrast in 07/2017 normal   PAST MEDICAL HISTORY: Past Medical History:  Diagnosis Date  . 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 Depression   . Seizures (HCC)     PAST SURGICAL HISTORY: Past Surgical History:  Procedure Laterality Date  . CIRCUMCISION    . SHOULDER CLOSED REDUCTION Right 12/08/2018   Procedure: CLOSED REDUCTION SHOULDER;  Surgeon: Yolonda Kida, MD;  Location: [facility];  Service: Orthopedics;  Laterality: Right;   Outpatient Encounter Medications as of 11/29/2019  Medication Sig  . FEXOFENADINE HCL PO Take 60 mg by mouth daily.  . citalopram (CELEXA) 10 MG tablet Take 10 mg by mouth daily.  .    . lamoTRIgine (LAMICTAL) 100 MG tablet Take 1 tablet twice a day (Patient taking differently: taking 1/2 tablet every night)  .     No facility-administered encounter medications on file as of 11/29/2019.     ALLERGIES: Allergies  Allergen Reactions  . Lactose Diarrhea  . Lactose Intolerance (Gi) Diarrhea    FAMILY HISTORY: Family History  Problem Relation Age of Onset  . Diabetes gravidarum Paternal Grandmother   . Heart attack Paternal Grandmother   . Healthy Mother   . Healthy Father     SOCIAL HISTORY: Social History   Socioeconomic History  . Marital status: Single    Spouse name: Not on file  . Number of children: 0  . Years of education: Currently in college  . Highest education level: Not on file  Occupational History  . Occupation: Consulting civil engineer at  [employer]  . Smoking status: Never Smoker  . Smokeless tobacco: Never Used  Vaping Use  . Vaping Use: Every day  Substance and Sexual Activity  . Alcohol use: No  . Drug use: No  . Sexual activity: Never  Other Topics Concern  . Not on file  Social History Narrative   Milbern is attending [employer]. He is Glass blower/designer in Primary school teacher.   Living with his father, step-mother, and siblings.   Right-handed.   He will sometimes drink a soda.   Social Determinants of Health   Financial Resource Strain:   . Difficulty of Paying Living Expenses: Not on file  Food Insecurity:   . Worried About Programme researcher, broadcasting/film/video in the Last Year: Not on file  . Ran Out of Food in the Last Year: Not on file  Transportation Needs:   . Lack of Transportation (Medical): Not on file  . Lack of Transportation (Non-Medical): Not on file  Physical Activity:   . Days of Exercise per Week: Not on file  . Minutes of Exercise per Session: Not on file  Stress:   . Feeling of Stress : Not on file  Social Connections:   .  Frequency of Communication with Friends and Family: Not on file  . Frequency of Social Gatherings with Friends and Family: Not on file  . Attends Religious Services: Not on file  . Active Member of Clubs or Organizations: Not on file  . Attends Banker Meetings: Not on file  . Marital Status: Not on file  Intimate Partner Violence:   . Fear of Current or Ex-Partner: Not on file  . Emotionally Abused: Not on file  . Physically Abused: Not on file  . Sexually Abused: Not on file     PHYSICAL EXAM: Vitals:   11/29/19 1020  BP: (!) 142/81  Pulse: 75  SpO2: 98%   General: No acute distress Head:  Normocephalic/atraumatic Skin/Extremities: No rash, no edema Neurological Exam: Mental status: alert and awake, occasional stuttering. No dysarthria or aphasia, Fund of knowledge is appropriate.  Recent and remote memory are intact.  Attention and concentration are normal.      Cranial nerves: CN I: not tested CN II: pupils equal, round and reactive to light, visual fields intact CN III, IV, VI:  full range of motion, no nystagmus, no ptosis CN V: facial sensation intact CN VII: upper and lower face symmetric CN VIII: hearing intact to conversation Bulk & Tone: normal, no fasciculations. Motor: 5/5 throughout with no pronator drift. Sensation: intact to light touch, cold, pin, vibration and joint position sense.  No extinction to double simultaneous stimulation.  Romberg test negative Deep Tendon Reflexes: +1 throughout Cerebellar: no incoordination on finger to nose testing Gait: narrow-based and steady, able to tandem walk adequately. Tremor: none   IMPRESSION: This is a 24 year old right-handed man with a history of primary generalized epilepsy diagnosed in 2014. They both report his last GTC was in October 2020. His father administers and self-regulates Cardin's medications, reporting that he is doing well on Lamotrigine 100mg  1/2 tab qhs. Taking a full tablet causes anger issues. We discussed that if seizures recur, would increase to 1/2 tab BID. Leyland is noted to be anxious with possible OCD and is interested in psychotherapy. He will also benefit from seeing a psychiatrist. He will be started on Trazodone 50mg  qhs to help with sleep and mood. Side effects discussed. His father does not feel citalopram was helping and was not giving it regularly, we agreed to stop medication. He does not drive. Follow-up in 6 months, they know to call for any changes.   Thank you for allowing me to participate in the care of this patient. Please do not hesitate to call for any questions or concerns.   [name], M.D.  CC: Dr. 

## 2019-12-01 ENCOUNTER — Emergency Department (HOSPITAL_COMMUNITY): Payer: 59

## 2019-12-01 ENCOUNTER — Emergency Department (HOSPITAL_COMMUNITY)
Admission: EM | Admit: 2019-12-01 | Discharge: 2019-12-01 | Disposition: A | Discharge status: Home / Self Care | Payer: 59 | Attending: Emergency Medicine | Admitting: Emergency Medicine

## 2019-12-01 ENCOUNTER — Other Ambulatory Visit: Payer: Self-pay

## 2019-12-01 ENCOUNTER — Encounter (HOSPITAL_COMMUNITY): Payer: Self-pay

## 2019-12-01 DIAGNOSIS — R002 Palpitations: Secondary | ICD-10-CM

## 2019-12-01 DIAGNOSIS — F419 Anxiety disorder, unspecified: Secondary | ICD-10-CM | POA: Diagnosis not present

## 2019-12-01 LAB — CBC
HCT: 40.1 % (ref 39.0–52.0)
Hemoglobin: 13.2 g/dL (ref 13.0–17.0)
MCH: 30.1 pg (ref 26.0–34.0)
MCHC: 32.9 g/dL (ref 30.0–36.0)
MCV: 91.3 fL (ref 80.0–100.0)
Platelets: 269 10*3/uL (ref 150–400)
RBC: 4.39 MIL/uL (ref 4.22–5.81)
RDW: 12.5 % (ref 11.5–15.5)
WBC: 3.7 10*3/uL — ABNORMAL LOW (ref 4.0–10.5)
nRBC: 0 % (ref 0.0–0.2)

## 2019-12-01 LAB — BASIC METABOLIC PANEL
Anion gap: 12 (ref 5–15)
BUN: 10 mg/dL (ref 6–20)
CO2: 26 mmol/L (ref 22–32)
Calcium: 9.4 mg/dL (ref 8.9–10.3)
Chloride: 101 mmol/L (ref 98–111)
Creatinine, Ser: 0.74 mg/dL (ref 0.61–1.24)
GFR, Estimated: >60 mL/min (ref >60)
Glucose, Bld: 97 mg/dL (ref 70–99)
Potassium: 3.8 mmol/L (ref 3.5–5.1)
Sodium: 139 mmol/L (ref 135–145)

## 2019-12-01 LAB — TROPONIN I (HIGH SENSITIVITY): Troponin I (High Sensitivity): 3 ng/L (ref <18)

## 2019-12-01 NOTE — ED Triage Notes (Signed)
Per EMS- patient c/o chest fluttering and pain x 5 minutes, but is now resolved. 12 lead-NSR.

## 2019-12-01 NOTE — ED Notes (Signed)
Discharge planning discussed with pt and family and understanding was verbalized and observed

## 2019-12-01 NOTE — Discharge Instructions (Addendum)
Your work-up today was reassuring, I think this is most likely coming from anxiety as we spoke about.  Please use the attached instructions on how to manage anxiety.  I also want you to stop using drugs, I think this might be adding to your symptoms.  Please use the attached instructions.  If your symptoms do not go away you can schedule an appointment with a cardiologist, their information is attached.  Please come back to the emergency department for any new or worsening concerning symptoms.

## 2019-12-01 NOTE — ED Provider Notes (Signed)
Stephen [name]   CSN: [id number] Arrival date & time: 12/01/19  1209     History Chief Complaint  Patient presents with  . heart flutter    Stephen [name] is a 24 y.o. male reported past medical history of anxiety, depression, seizures that presents the emergency department today for chest flutter and chest pressure that has been going on for 2 weeks.  Patient states that he had an episode this morning of chest fluttering that lasted 5 minutes, no chest pain. Last chest pressure a couple days ago.  States that symptoms have been intermittent for the past 2 weeks.  Patient states that he has been using delta 8 and has also been vaping, no other drug use or alcohol use.  Patient states that he is generally anxious person, wanted to make sure he was not specifically having a heart attack due to his vape use.  Denies any cough, shortness of breath, fevers, chills, nausea, vomiting, abdominal pain, back pain, dizziness, headache.  Denies any numbness or tingling.  Has been taking seizure medications as normal.  Has not tried anything for this.  Started using delta 8 about 2 weeks ago.  Also states that he recently got fired from a job 3 weeks ago, has been anxious about that.  Denies any cardiac history.  Denies any caffeine use.  HPI     Past Medical History:  Diagnosis Date  . 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 Depression   . Seizures [facility])     Patient Active Problem List   Diagnosis Date Noted  . 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 Depression 12/21/2016  . Facial rash 10/26/2016  . Lethargy 10/26/2016  . Abnormal TSH 10/26/2016  . Mood changes 10/15/2016  . 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 Outbursts of anger 12/31/2015  . Encounter for long-term (current) use of high-risk medication 12/23/2014  . ODD (oppositional defiant disorder)  11/12/2014  . 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 MDD (major depressive disorder), recurrent episode, moderate (HCC) 11/12/2014  . 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 Seizure disorder (HCC) 08/31/2012    Past Surgical History:  Procedure Laterality Date  . CIRCUMCISION    . SHOULDER CLOSED REDUCTION Right 12/08/2018   Procedure: CLOSED REDUCTION SHOULDER;  Surgeon: Yolonda Kida, MD;  Location: [facility] OR;  Service: Orthopedics;  Laterality: Right;       Family History  Problem Relation Age of Onset  . Diabetes gravidarum Paternal Grandmother   . Heart attack Paternal Grandmother   . Healthy Mother   . Healthy Father     Social History   Tobacco Use  . Smoking status: Never Smoker  . Smokeless tobacco: Never Used  Vaping Use  . Vaping Use: Every day  . Substances: Nicotine  Substance Use Topics  . Alcohol use: No  . Drug use: No    Home Medications Prior to Admission medications   Medication Sig Start Date End Date Taking? Authorizing Provider  FEXOFENADINE HCL PO Take 1 tablet by mouth daily.    Yes [provider]  lamoTRIgine (LAMICTAL) 100 MG tablet Take 1/2 tablet every night Patient taking differently: Take 50 mg by mouth at bedtime.  11/29/19  Yes Van Clines, MD  traZODone (DESYREL) 50 MG tablet Take 1 tablet (50 mg total) by mouth at bedtime. 11/29/19  Yes Van Clines, MD    Allergies  Lactose intolerance (gi)  Review of Systems   Review of Systems  Constitutional: Negative for chills, diaphoresis, fatigue and fever.  HENT: Negative for congestion, sore throat and trouble swallowing.   Eyes: Negative for pain and visual disturbance.  Respiratory: Negative for cough, shortness of breath and wheezing.   Cardiovascular: Positive for palpitations. Negative for chest pain and leg swelling.  Gastrointestinal: Negative for abdominal distention, abdominal pain, diarrhea, nausea and vomiting.    Genitourinary: Negative for difficulty urinating.  Musculoskeletal: Negative for back pain, neck pain and neck stiffness.  Skin: Negative for pallor.  Neurological: Negative for dizziness, speech difficulty, weakness and headaches.  Psychiatric/Behavioral: Negative for confusion.    Physical Exam Updated Vital Signs BP 132/86 (BP Location: Right Arm)   Pulse 74   Temp 98.2 F (36.8 C) (Oral)   Resp 15   Ht 5\' 9"  (1.753 m)   Wt 71.7 kg   SpO2 100%   BMI 23.33 kg/m   Physical Exam Constitutional:      General: He is not in acute distress.    Appearance: Normal appearance. He is not ill-appearing, toxic-appearing or diaphoretic.     Comments: Patient appears well, no acute distress.  HENT:     Head: Normocephalic and atraumatic.     Mouth/Throat:     Mouth: Mucous membranes are moist.     Pharynx: Oropharynx is clear.  Eyes:     General: No scleral icterus.    Extraocular Movements: Extraocular movements intact.     Pupils: Pupils are equal, round, and reactive to light.  Cardiovascular:     Rate and Rhythm: Normal rate and regular rhythm.     Pulses: Normal pulses.     Heart sounds: Normal heart sounds.  Pulmonary:     Effort: Pulmonary effort is normal. No respiratory distress.     Breath sounds: Normal breath sounds. No stridor. No wheezing, rhonchi or rales.  Chest:     Chest wall: No tenderness.  Abdominal:     General: Abdomen is flat. There is no distension.     Palpations: Abdomen is soft.     Tenderness: There is no abdominal tenderness. There is no guarding or rebound.  Musculoskeletal:        General: No swelling or tenderness. Normal range of motion.     Cervical back: Normal range of motion and neck supple. No rigidity.     Right lower leg: No edema.     Left lower leg: No edema.  Skin:    General: Skin is warm and dry.     Capillary Refill: Capillary refill takes less than 2 seconds.     Coloration: Skin is not pale.  Neurological:     General: No  focal deficit present.     Mental Status: He is alert and oriented to person, place, and time.  Psychiatric:        Mood and Affect: Mood normal.     Comments: Patient is extremely anxious, stutters multiple times on exam.  This appears baseline for him.     ED Results / Procedures / Treatments   Labs (all labs ordered are listed, but only abnormal results are displayed) Labs Reviewed  CBC - Abnormal; Notable for the following components:      Result Value   WBC 3.7 (*)    All other components within normal limits  BASIC METABOLIC PANEL  TROPONIN I (HIGH SENSITIVITY)    EKG EKG Interpretation  Date/Time:  Saturday December 01 2019 12:21:52 EDT Ventricular Rate:  72 PR Interval:    QRS Duration: 90 QT Interval:  360 QTC Calculation: 394 R Axis:   66 Text Interpretation: Sinus rhythm Confirmed by Stephen [name] [phone]) on 12/01/2019 1:31:47 PM   Radiology DG Chest 2 View  Result Date: 12/01/2019 CLINICAL DATA:  Patient c/o chest fluttering and pain x 5 minutes today. Has had recent chest palpitations x 2 weeks. Nonsmoker. EXAM: CHEST - 2 VIEW COMPARISON:  None. FINDINGS: The heart size and mediastinal contours are within normal limits. Both lungs are clear. No pleural effusion or pneumothorax. The visualized skeletal structures are unremarkable. IMPRESSION: No active cardiopulmonary disease. Electronically Signed   By: Amie Portland M.D.   On: 12/01/2019 13:26    Procedures Procedures (including critical care time)  Medications Ordered in ED Medications - No data to display  ED Course  I have reviewed the triage vital signs and the nursing notes.  Pertinent labs & imaging results that were available during my care of the patient were reviewed by me and considered in my medical decision making (see chart for details).    MDM Rules/Calculators/A&P                         Harbert Fitterer is a 24 y.o. male reported past medical history of anxiety, depression, seizures  that presents the emergency department today for chest flutter and chest pressure that has been going on for 2 weeks.  No chest pain or chest pressure today, last time he had chest pressure was 2 days ago. Differential diagnoses considered include anxiety, arrhythmia.  ECG interpreted by me demonstrated normal sinus rhythm.  CXR interpreted by me demonstrated no acute cardiopulmonary disease.  Labs demonstrated normal CBC, CMP, troponin less than 3.  Do not think we need to repeat, patient is not having chest pain, last episode of fluttering was very early this morning.  Patient is a young healthy 24 year old male without any comorbidities.  Given the above findings, my suspicion is that patient is having anxiety most likely from recently being fired from his job in conjunction with using delta 8.  Patient seems like an overly anxious person as well, did discuss this with patient who agrees.  Symptomatic treatment discussed with patient and mom, mom also agrees about patient's anxiety.  Patient currently does not want anything for anxiety in regards to medication, will also give cardiology referral at this time if patient continues to experience palpitations for cardiac monitoring.  Doubt need for further emergent work up at this time. I explained the diagnosis and have given explicit precautions to return to the ER including for any other new or worsening symptoms. The patient understands and accepts the medical plan as it's been dictated and I have answered their questions. Discharge instructions concerning home care and prescriptions have been given. The patient is STABLE and is discharged to home in good condition.  Final Clinical Impression(s) / ED Diagnoses Final diagnoses:  Anxiety  Palpitations    Rx / DC Orders ED Discharge Orders    None       Farrel Gordon, [name] 12/01/19 1718    Margarita Grizzle, MD 12/01/19 2028

## 2019-12-01 NOTE — ED Notes (Signed)
Patient went to the restroom when EMS was bring the patient to triage even when they asked him to come to a triage room due to him having a c/o chest pain.

## 2019-12-20 ENCOUNTER — Emergency Department (HOSPITAL_COMMUNITY): Payer: 59

## 2019-12-20 ENCOUNTER — Other Ambulatory Visit: Payer: Self-pay

## 2019-12-20 ENCOUNTER — Emergency Department (HOSPITAL_COMMUNITY)
Admission: EM | Admit: 2019-12-20 | Discharge: 2019-12-20 | Disposition: A | Discharge status: Home / Self Care | Payer: 59 | Attending: Emergency Medicine | Admitting: Emergency Medicine

## 2019-12-20 DIAGNOSIS — Z79899 Other long term (current) drug therapy: Secondary | ICD-10-CM | POA: Diagnosis not present

## 2019-12-20 DIAGNOSIS — X501XXA Overexertion from prolonged static or awkward postures, initial encounter: Secondary | ICD-10-CM | POA: Insufficient documentation

## 2019-12-20 DIAGNOSIS — S43014A Anterior dislocation of right humerus, initial encounter: Secondary | ICD-10-CM | POA: Insufficient documentation

## 2019-12-20 DIAGNOSIS — S43004A Unspecified dislocation of right shoulder joint, initial encounter: Secondary | ICD-10-CM

## 2019-12-20 DIAGNOSIS — S4991XA Unspecified injury of right shoulder and upper arm, initial encounter: Secondary | ICD-10-CM | POA: Diagnosis present

## 2019-12-20 LAB — RAPID URINE DRUG SCREEN, HOSP PERFORMED
Amphetamines: NOT DETECTED
Barbiturates: NOT DETECTED
Benzodiazepines: NOT DETECTED
Cocaine: NOT DETECTED
Opiates: NOT DETECTED
Tetrahydrocannabinol: NOT DETECTED

## 2019-12-20 MED ORDER — PROPOFOL 10 MG/ML IV BOLUS
100.0000 mg | Freq: Once | INTRAVENOUS | Status: AC
  Administered 2019-12-20: 40 mg via INTRAVENOUS
  Filled 2019-12-20: qty 20

## 2019-12-20 MED ORDER — PROPOFOL 500 MG/50ML IV EMUL
INTRAVENOUS | Status: AC | PRN
  Administered 2019-12-20 (×3): 20 mg via INTRAVENOUS
  Administered 2019-12-20: 40 mg via INTRAVENOUS

## 2019-12-20 MED ORDER — FENTANYL CITRATE (PF) 100 MCG/2ML IJ SOLN
100.0000 ug | Freq: Once | INTRAMUSCULAR | Status: AC
  Administered 2019-12-20: 100 ug via INTRAVENOUS
  Filled 2019-12-20: qty 2
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
## 2019-12-20 NOTE — Discharge Instructions (Signed)
Please read and follow all provided instructions.  Your diagnoses today include:  1. Dislocation of right shoulder joint, initial encounter     Tests performed today include:  An x-ray of the affected area - showed your shoulder was out of place, and then back in place.   Vital signs. See below for your results today.   Medications prescribed:  Please use over-the-counter NSAID medications (ibuprofen, naproxen) as directed on the packaging for pain.   Take any prescribed medications only as directed.  Home care instructions:   Follow any educational materials contained in this packet  Follow R.I.C.E. Protocol:  R - rest your injury   I  - use ice on injury without applying directly to skin  C - compress injury with bandage or splint  E - elevate the injury as much as possible  Follow-up instructions: Please follow-up with the provided orthopedic physician (bone specialist) in 1 week.   Return instructions:   Please return if your fingers are numb or tingling, appear gray or blue, or you have severe pain (also elevate the arm and loosen splint or wrap if you were given one)  Please return to the Emergency Department if you experience worsening symptoms.   Please return if you have any other emergent concerns.  Additional Information:  Your vital signs today were: BP 127/84   Pulse 76   Temp 98.7 F (37.1 C) (Oral)   Resp 17   SpO2 100%  If your blood pressure (BP) was elevated above 135/85 this visit, please have this repeated by your doctor within one month. --------------

## 2019-12-20 NOTE — Progress Notes (Signed)
Orthopedic Tech Progress Note Patient Details:  Stephen [name] [birth year] [id number]  Ortho Devices Type of Ortho Device: Sling immobilizer Ortho Device/Splint Location: right shoulder reduction Ortho Device/Splint Interventions: Application   Post Interventions Patient Tolerated: Well Instructions Provided: Care of device   [name] 12/20/2019, 9:56 AM

## 2019-12-20 NOTE — ED Provider Notes (Signed)
.  Sedation  Date/Time: 12/20/2019 11:01 AM Performed by: Terrilee Files, MD Authorized by: Terrilee Files, MD   Consent:    Consent obtained:  Verbal and written   Consent given by:  Patient   Risks discussed:  Allergic reaction, dysrhythmia, inadequate sedation, nausea, prolonged hypoxia resulting in organ damage, prolonged sedation necessitating reversal, respiratory compromise necessitating ventilatory assistance and intubation and vomiting   Alternatives discussed:  Analgesia without sedation, anxiolysis and regional anesthesia Universal protocol:    Procedure explained and questions answered to patient or proxy's satisfaction: yes     Relevant documents present and verified: yes     Test results available and properly labeled: yes     Imaging studies available: yes     Required blood products, implants, devices, and special equipment available: yes     Site/side marked: yes     Immediately prior to procedure a time out was called: yes     Patient identity confirmation method:  Verbally with patient Indications:    Procedure necessitating sedation performed by:  Physician performing sedation Pre-sedation assessment:    Time since last food or drink:  6   ASA classification: class 1 - normal, healthy patient     Neck mobility: normal     Mouth opening:  3 or more finger widths   Thyromental distance:  4 finger widths   Mallampati score:  II - soft palate, uvula, fauces visible   Pre-sedation assessments completed and reviewed: airway patency, cardiovascular function, hydration status, mental status, nausea/vomiting, pain level, respiratory function and temperature     Pre-sedation assessment completed:  12/20/2019 9:55 AM Immediate pre-procedure details:    Reassessment: Patient reassessed immediately prior to procedure     Reviewed: vital signs, relevant labs/tests and NPO status     Verified: bag valve mask available, emergency equipment available, intubation equipment  available, IV patency confirmed, oxygen available and suction available   Procedure details (see MAR for exact dosages):    Preoxygenation:  Nasal cannula   Sedation:  Propofol   Intended level of sedation: deep   Intra-procedure monitoring:  Blood pressure monitoring, cardiac monitor, continuous pulse oximetry, frequent LOC assessments, frequent vital sign checks and continuous capnometry   Intra-procedure events: none     Total Provider sedation time (minutes):  15 Post-procedure details:    Post-sedation assessment completed:  12/20/2019 11:02 AM   Attendance: Constant attendance by certified staff until patient recovered     Recovery: Patient returned to pre-procedure baseline     Post-sedation assessments completed and reviewed: airway patency, cardiovascular function, hydration status, mental status, nausea/vomiting, pain level, respiratory function and temperature     Patient is stable for discharge or admission: yes     Patient tolerance:  Tolerated well, no immediate complications      Terrilee Files, MD 12/21/19 [phone]
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
## 2020-02-20 ENCOUNTER — Encounter (HOSPITAL_COMMUNITY): Payer: Self-pay | Admitting: Registered Nurse

## 2020-02-20 ENCOUNTER — Ambulatory Visit (HOSPITAL_COMMUNITY)
Admission: EM | Admit: 2020-02-20 | Discharge: 2020-02-20 | Disposition: A | Discharge status: Home / Self Care | Payer: 59 | Attending: Registered Nurse | Admitting: Registered Nurse

## 2020-02-20 ENCOUNTER — Other Ambulatory Visit: Payer: Self-pay

## 2020-02-20 DIAGNOSIS — R4586 Emotional lability: Secondary | ICD-10-CM | POA: Diagnosis not present

## 2020-02-20 DIAGNOSIS — F429 Obsessive-compulsive disorder, unspecified: Secondary | ICD-10-CM | POA: Diagnosis present

## 2020-02-20 DIAGNOSIS — F332 Major depressive disorder, recurrent severe without psychotic features: Secondary | ICD-10-CM | POA: Diagnosis not present

## 2020-02-20 DIAGNOSIS — R454 Irritability and anger: Secondary | ICD-10-CM

## 2020-02-20 NOTE — ED Provider Notes (Signed)
Behavioral Health Urgent Care Medical Screening Exam  Patient Name: Stephen [name] MRN: [id number] Date of Evaluation: 02/20/20 Chief Complaint:   Diagnosis:  Final diagnoses:  Obsessive-compulsive disorder, unspecified type  Mood changes  MDD (major depressive disorder), recurrent severe, without psychosis (HCC)  Outbursts of anger    History of Present illness: Stephen [name] is a 25 y.o. male patient presented to [facility] as a walk in accompanied by his father with complaints of episodes of obsessive-compulsive behavior, paranoia  Stephen [name], 31 y.o., male patient seen face to face by this provider, consulted with Dr. Bronwen Betters; and chart reviewed on 02/20/20.  On evaluation Stephen [name] reports he was seen by his neurologist related to reporting auditory and visual hallucinations this morning when he woke, also has some paranoia.  Patient states he is not currently having any hallucinations and denies suicidal ideation.  Patient reported he had a vision this morning of himself, and seeing a dead body.  Patient states he lives at home with his mother, father, and siblings reports family is supportive.  Patient and father reporting that patient is not sleeping well and that the trazodone is not helping.  Want to get medication adjustments discussed setting up outpatient psychiatric services for medication management and therapy both patient and father agreeable patient does report occasional use of delta 8 or CBD products.  Denies prior psychiatric history. During evaluation Stephen [name] is sitting upright in chair in no acute distress.  He is alert, oriented x 4, calm and cooperative.  His mood is anxious with congruent affect.  He does not appear to be responding to internal/external stimuli or delusional thoughts.  Patient denies suicidal/self-harm/homicidal ideation, psychosis, and paranoia.  Patient answered question appropriately.     Psychiatric Specialty Exam  Presentation   General Appearance:Appropriate for Environment; Casual  Eye Contact:Fair  Speech:Clear and Coherent; Other (comment) (Stutter)  Speech Volume:Normal  Handedness:Right   Mood and Affect  Mood:Anxious  Affect:Appropriate; Congruent   Thought Process  Thought Processes:Coherent; Goal Directed  Descriptions of Associations:Intact  Orientation:Full (Time, Place and Person)  Thought Content:WDL  Hallucinations:None  Ideas of Reference:None  Suicidal Thoughts:No  Homicidal Thoughts:No   Sensorium  Memory:Immediate Good; Recent Good  Judgment:Intact  Insight:Present   Executive Functions  Concentration:Fair  Attention Span:Fair  Recall:Good  Fund of Knowledge:Fair  Language:Good   Psychomotor Activity  Psychomotor Activity:Normal   Assets  Assets:Communication Skills; Desire for Improvement; Housing; Leisure Time; Social Support; Transportation   Sleep  Sleep:Fair  Number of hours: No data recorded  Physical Exam: Physical Exam Vitals and nursing note reviewed. Exam conducted with a chaperone present.  Constitutional:      General: He is not in acute distress.    Appearance: Normal appearance. He is not ill-appearing.  HENT:     Head: Normocephalic and atraumatic.  Eyes:     Pupils: Pupils are equal, round, and reactive to light.  Cardiovascular:     Rate and Rhythm: Tachycardia present.  Pulmonary:     Effort: Pulmonary effort is normal.     Breath sounds: Normal breath sounds.  Musculoskeletal:        General: Normal range of motion.     Cervical back: Normal range of motion.  Skin:    General: Skin is warm and dry.  Neurological:     Mental Status: He is alert and oriented to person, place, and time.  Psychiatric:        Attention and Perception: Attention and perception normal. He does  not perceive auditory or visual hallucinations.        Mood and Affect: Mood and affect normal.        Behavior: Behavior normal. Behavior is  cooperative.        Thought Content: Thought content normal. Thought content is not paranoid or delusional. Thought content does not include homicidal or suicidal ideation.        Cognition and Memory: Cognition and memory normal.        Judgment: Judgment normal.    Review of Systems  Constitutional: Negative.   HENT: Negative.   Eyes: Negative.   Respiratory: Negative.   Cardiovascular: Negative.   Gastrointestinal: Negative.   Genitourinary: Negative.   Musculoskeletal: Negative.   Skin: Negative.   Neurological: Positive for seizures.  Endo/Heme/Allergies: Negative.   Psychiatric/Behavioral: Positive for suicidal ideas. Depression: Stable. Hallucinations:  reports  he has had auditory and visual hallucinations before but not currently.  But woke this morning with hallucinations and paranoia. Substance abuse: Has used CBD, Delta 8 Hemp. The patient has insomnia.    Blood pressure 96/63, pulse (!) 108, resp. rate 18, SpO2 92 %. There is no height or weight on file to calculate BMI.  Musculoskeletal: Strength & Muscle Tone: within normal limits Gait & Station: normal Patient leans: N/A   [facility] MSE Discharge Disposition for Follow up and Recommendations: Based on my evaluation the patient does not appear to have an emergency medical condition and can be discharged with resources and follow up care in outpatient services for Medication Management, Individual Therapy and Group Therapy    Follow-up Information    Go to  [facility].   Specialty: Urgent Care Why: Open Access:  Monday - Thursday from 8 am to 11 am for medication management and therapy intake.  On Friday from 1 pm to 4 pm for therapy intake only Contact information: [address] [place] [address] [phone]               Assunta Found, NP 02/20/2020, 2:54 PM

## 2020-02-20 NOTE — Discharge Planning (Signed)
Stephen [name] to be D/C'd Home per NP order.  Discussed with the patient and all questions fully answered.  VSS, Skin clean, dry and intact without evidence of skin break down, no evidence of skin tears noted.  D/c education completed with patient/family including follow up instructions and outpatient resources and times and dates, medication list, d/c activities limitations if indicated, with other d/c instructions as indicated by NP - patient able to verbalize understanding, all questions fully answered.   Patient instructed to return to ED, call 911, or call NP for any changes in condition.   Patient escorted via WC, and D/C home via private auto.  [name] 02/20/2020 3:12 PM

## 2020-02-20 NOTE — BH Assessment (Signed)
Comprehensive Clinical Assessment (CCA) Note  02/20/2020 Stephen [name] [id number]  Chief Complaint:  Chief Complaint  Patient presents with  . 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  Pt lives in [place] with his father and step-mother.  He is employed.  Pt stated that he receives outpatient therapy services with a provider named ''[facility],'' but no psychiatric services.  Pt has a seizure disorder, and he is prescribed Lamictal and Trazodone (for sleep) by [facility].  Pt has not been assessed by TTS before.  Pt was assessed by Pryor Curia and S. Rankin, NP.  Pt's father was present.  Per father has a history of poor sleep and episodes of hallucination.  Pt's father stated that this morning, Pt was dreaming, awoke with hallucinations, and called 911.  Pt stated that he hallucinated seeing himself as a dead person.  Pt reported that he has a history of hallucination.  He also reported recent passive suicidal ideation and homicidal ideation following conflict with family.  Pt denied past suicide attempts.  Pt reported that in addition to recent hallucination, he also experiences compulsive behavior -- he will spend an hour grooming the bathroom, and he will overuse body spray.  Pt also reported episodes of paranoid thinking -- for example, he sometimes worries that people are talking about him or judging his appearance.  Pt asked for referral to a psychiatrist.  Pt presented as alert and oriented.  He had good eye contact and was cooperative.  Pt was dressed in street clothes, and he appeared appropriately groomed.  Pt's mood was anxious.  Affect was full range.  Pt's speech was normal in rate, rhythm, and volume.  Thought processes were within normal range,  and thought content was goal-oriented.  There was no evidence of delusion, and Pt denied current hallucination.  Memory and concentration were fair.  Insight, judgment, and impulse control were fair.  Consulted with S. Rankin, NP, who also spoke with Pt.  Pt is psych-cleared and discharged with instructions to present as a walk-in to [facility] outpatient services.   CCA Screening, Triage and Referral (STR)  Patient Reported Information How did you hear about [place]? Other (Comment)  Referral name: [facility]  Referral phone number: No data recorded  Whom do you see for routine medical problems? Primary Care  Practice/Facility Name: [facility]  Practice/Facility Phone Number: No data recorded Name of Contact: K. Delice Lesch, MD  Contact Number: No data recorded Contact Fax Number: No data recorded Prescriber Name: Ellouise Newer, MD  Prescriber Address (if known): No data recorded  What Is the Reason for Your Visit/Call Today? Recent hallucination, insomia  How Long Has This Been Causing You Problems? 1 wk - 1 month  What Do You Feel Would Help You the Most Today? Other (Comment) (Not sure; prefers referral to psychiatrist)   Have You Recently Been in Any Inpatient Treatment (Hospital/Detox/Crisis Center/28-Day Program)? No  Name/Location of Program/Hospital:No data recorded How Long Were You There? No data recorded When Were You Discharged? No data recorded  Have You Ever Received Services From [facility] Before? No  Who Do You See at [facility]? No data recorded  Have You Recently Had Any Thoughts About Hurting Yourself? No  Are You Planning to Commit Suicide/Harm Yourself At This time? No  Have you Recently Had Thoughts About Hurting Someone Karolee Ohs? Yes (Phreesia 02/20/2020)  Explanation: No data recorded  Have You Used Any Alcohol or Drugs in the Past 24 Hours? No  How Long Ago Did You Use Drugs or Alcohol? No data recorded What Did You Use and How Much?  No data recorded  Do You Currently Have a Therapist/Psychiatrist? No  Name of Therapist/Psychiatrist: No data recorded  Have You Been Recently Discharged From Any Office Practice or Programs? No  Explanation of Discharge From Practice/Program: No data recorded    CCA Screening Triage Referral Assessment Type of Contact: Face-to-Face  Is this Initial or Reassessment? No data recorded Date Telepsych consult ordered in [facility]:  No data recorded Time Telepsych consult ordered in [facility]:  No data recorded  Patient Reported Information Reviewed? Yes  Patient Left Without Being Seen? No data recorded Reason for Not Completing Assessment: No data recorded  Collateral Involvement: Pt's father was present during assessment.   Does Patient Have a Automotive engineer Guardian? No data recorded Name and Contact of Legal Guardian: No data recorded If Minor and Not Living with Parent(s), Who has Custody? No data recorded Is CPS involved or ever been involved? Never  Is APS involved or ever been involved? Never   Patient Determined To Be At Risk for Harm To Self or Others Based on Review of Patient Reported Information or Presenting Complaint? No  Method: No data recorded Availability of Means: No data recorded Intent: No data recorded Notification Required: No data recorded Additional Information for Danger to Others Potential: No data recorded Additional Comments for Danger to Others Potential: No data recorded Are There Guns or Other Weapons in Your Home? No data recorded Types of Guns/Weapons: No data recorded Are These Weapons Safely Secured?                            No data recorded Who Could Verify You Are Able To Have These Secured: No data recorded Do You Have any Outstanding Charges, Pending Court Dates, Parole/Probation? No data recorded Contacted To Inform of Risk of Harm To Self or Others: No data recorded  Location of Assessment: [facility]   Does  Patient Present under Involuntary Commitment? No  IVC Papers Initial File Date: No data recorded  [place] of Residence: [place]   Patient Currently Receiving the Following Services: Not Receiving Services   Determination of Need: Urgent (48 hours)   Options For Referral: Medication Management; Outpatient Therapy     CCA Biopsychosocial Intake/Chief Complaint:  Pt had apparent hallucinations this AM; also experiencing insomnia  Current Symptoms/Problems: Compulsive behavior, possible hallucination, paranoia   Patient Reported Schizophrenia/Schizoaffective Diagnosis in Past: No   Strengths: Supportive family; Pt receives neurology services  Preferences: Not sure  Abilities: Pt works a part-time job   Type of Services Patient Feels are Needed: Referral to psychiatrist   Initial Clinical Notes/Concerns: Pt denied current suicidal ideation, homicidal ideation, hallucination, self-injurious behavior, substance use   Mental Health Symptoms Depression:  No data recorded  Duration of Depressive symptoms: No data recorded  Mania:  None   Anxiety:   Difficulty concentrating; Tension; Sleep   Psychosis:  Hallucinations   Duration of Psychotic symptoms: Less than six months   Trauma:  No data recorded  Obsessions:  N/A   Compulsions:  Disrupts with routine/functioning; "Driven" to perform behaviors/acts; Intrusive/time consuming; Poor Insight   Inattention:  N/A   Hyperactivity/Impulsivity:  N/A   Oppositional/Defiant Behaviors:  N/A   Emotional Irregularity:  N/A   Other Mood/Personality Symptoms:  No data recorded   Mental Status Exam Appearance and self-care  Stature:  Average   Weight:  Average weight   Clothing:  Casual   Grooming:  Normal   Cosmetic use:  None   Posture/gait:  Normal   Motor activity:  Not Remarkable   Sensorium  Attention:  Distractible   Concentration:  Normal   Orientation:  X5   Recall/memory:  Normal   Affect  and Mood  Affect:  Full Range   Mood:  Anxious   Relating  Eye contact:  Normal   Facial expression:  Responsive   Attitude toward examiner:  Cooperative   Thought and Language  Speech flow: Normal   Thought content:  Appropriate to Mood and Circumstances   Preoccupation:  None   Hallucinations:  Visual (Not currently experiencing)   Organization:  No data recorded  [employer] of Knowledge:  Average   Intelligence:  Average   Abstraction:  Concrete   Judgement:  Poor; Fair   Reality Testing:  Adequate   Insight:  Poor   Decision Making:  Only simple   Social Functioning  Social Maturity:  Isolates   Social Judgement:  Normal   Stress  Stressors:  Other (Comment)   Coping Ability:  Overwhelmed   Skill Deficits:  Self-control   Supports:  Family     Religion:    Leisure/Recreation:    Exercise/Diet: Exercise/Diet Do You Have Any Trouble Sleeping?: Yes Explanation of Sleeping Difficulties: Frequent insomnia   CCA Employment/Education Employment/Work Situation: Employment / Work Situation Employment situation: Employed Patient's job has been impacted by current illness: No Has patient ever been in the [employer]?: No  Education: Education Is Patient Currently Attending School?: No   CCA Family/Childhood History Family and Relationship History: Family history Marital status: Single  Childhood History:  Childhood History By whom was/is the patient raised?: Associate Professor Patient's description of current relationship with people who raised him/her: Pt currently lives with father and step-mother Does patient have siblings?: Yes Number of Siblings: 4 Did patient suffer any verbal/emotional/physical/sexual abuse as a child?: No Did patient suffer from severe childhood neglect?: No Has patient ever been sexually abused/assaulted/raped as an adolescent or adult?: No Was the patient ever a victim of a crime or a disaster?:  No Witnessed domestic violence?: No Has patient been affected by domestic violence as an adult?: No  Child/Adolescent Assessment:     CCA Substance Use Alcohol/Drug Use: Alcohol / Drug Use Pain Medications: Please see MAR Prescriptions: Please see MAR Over the Counter: Please see MAR History of alcohol / drug use?: Yes Longest period of sobriety (when/how long): unknown Substance #1 Name of Substance 1: Cannabis 1 - Last Use / Amount: Several months ago                       ASAM's:  Six Dimensions of Multidimensional Assessment  Dimension 1:  Acute Intoxication and/or Withdrawal Potential:      Dimension 2:  Biomedical Conditions and Complications:      Dimension 3:  Emotional, Behavioral, or Cognitive Conditions and Complications:     Dimension 4:  Readiness to Change:     Dimension 5:  Relapse, Continued use, or Continued Problem Potential:     Dimension 6:  Recovery/Living Environment:     ASAM Severity Score:    ASAM Recommended Level of  Treatment:     Substance use Disorder (SUD)    Recommendations for Services/Supports/Treatments:    DSM5 Diagnoses: Patient Active Problem List   Diagnosis Date Noted  . 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 Depression 12/21/2016  . Facial rash 10/26/2016  . Lethargy 10/26/2016  . Abnormal TSH 10/26/2016  . Mood changes 10/15/2016  . 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 Outbursts of anger 12/31/2015  . Encounter for long-term (current) use of high-risk medication 12/23/2014  . ODD (oppositional defiant disorder) 11/12/2014  . 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 MDD (major depressive disorder), recurrent episode, moderate (HCC) 11/12/2014  . 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 Seizure disorder (HCC) 08/31/2012    Patient Centered Plan: Patient is on the following  Treatment Plan(s):    Referrals to Alternative Service(s): Referred to Alternative Service(s):   Place:   Date:   Time:    Referred to Alternative Service(s):   Place:   Date:   Time:    Referred to Alternative Service(s):   Place:   Date:   Time:    Referred to Alternative Service(s):   Place:   Date:   Time:     Earline Mayotte, [facility]

## 2020-02-21 ENCOUNTER — Ambulatory Visit (INDEPENDENT_AMBULATORY_CARE_PROVIDER_SITE_OTHER): Payer: 59 | Admitting: Behavioral Health

## 2020-02-21 DIAGNOSIS — F331 Major depressive disorder, recurrent, moderate: Secondary | ICD-10-CM
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
## 2020-02-26 ENCOUNTER — Telehealth (HOSPITAL_COMMUNITY): Payer: Self-pay | Admitting: General Practice

## 2020-02-26 NOTE — Telephone Encounter (Signed)
Care Management - Follow Up [facility] Discharges   Writer attempted to make contact with patient today and was unsuccessful.  Writer was able to leave a HIPPA compliant voice message and will await callback.  Per chart review, patient has completed an appointment with Dorothe Pea, LCSW  on 02-21-2020.

## 2020-03-04 ENCOUNTER — Telehealth: Payer: Self-pay

## 2020-03-04 ENCOUNTER — Other Ambulatory Visit: Payer: Self-pay

## 2020-03-04 ENCOUNTER — Emergency Department (HOSPITAL_COMMUNITY): Payer: 59

## 2020-03-04 ENCOUNTER — Emergency Department (HOSPITAL_COMMUNITY)
Admission: EM | Admit: 2020-03-04 | Discharge: 2020-03-04 | Disposition: A | Discharge status: Home / Self Care | Payer: 59 | Attending: Emergency Medicine | Admitting: Emergency Medicine

## 2020-03-04 DIAGNOSIS — M24411 Recurrent dislocation, right shoulder: Secondary | ICD-10-CM | POA: Diagnosis present

## 2020-03-04 DIAGNOSIS — Z79899 Other long term (current) drug therapy: Secondary | ICD-10-CM | POA: Insufficient documentation

## 2020-03-04 DIAGNOSIS — R569 Unspecified convulsions: Secondary | ICD-10-CM | POA: Insufficient documentation

## 2020-03-04 LAB — CBC WITH DIFFERENTIAL/PLATELET
Abs Immature Granulocytes: 0.03 10*3/uL (ref 0.00–0.07)
Basophils Absolute: 0 10*3/uL (ref 0.0–0.1)
Basophils Relative: 0 %
Eosinophils Absolute: 0 10*3/uL (ref 0.0–0.5)
Eosinophils Relative: 0 %
HCT: 43.4 % (ref 39.0–52.0)
Hemoglobin: 13.4 g/dL (ref 13.0–17.0)
Immature Granulocytes: 0 %
Lymphocytes Relative: 17 %
Lymphs Abs: 1.2 10*3/uL (ref 0.7–4.0)
MCH: 28.6 pg (ref 26.0–34.0)
MCHC: 30.9 g/dL (ref 30.0–36.0)
MCV: 92.7 fL (ref 80.0–100.0)
Monocytes Absolute: 0.4 10*3/uL (ref 0.1–1.0)
Monocytes Relative: 5 %
Neutro Abs: 5.6 10*3/uL (ref 1.7–7.7)
Neutrophils Relative %: 78 %
Platelets: 317 10*3/uL (ref 150–400)
RBC: 4.68 MIL/uL (ref 4.22–5.81)
RDW: 12.5 % (ref 11.5–15.5)
WBC: 7.2 10*3/uL (ref 4.0–10.5)
nRBC: 0 % (ref 0.0–0.2)

## 2020-03-04 LAB — BASIC METABOLIC PANEL
Anion gap: 11 (ref 5–15)
BUN: 8 mg/dL (ref 6–20)
CO2: 24 mmol/L (ref 22–32)
Calcium: 9.2 mg/dL (ref 8.9–10.3)
Chloride: 105 mmol/L (ref 98–111)
Creatinine, Ser: 0.73 mg/dL (ref 0.61–1.24)
GFR, Estimated: >60 mL/min (ref >60)
Glucose, Bld: 113 mg/dL — ABNORMAL HIGH (ref 70–99)
Potassium: 4 mmol/L (ref 3.5–5.1)
Sodium: 140 mmol/L (ref 135–145)

## 2020-03-04 LAB — RAPID URINE DRUG SCREEN, HOSP PERFORMED
Amphetamines: NOT DETECTED
Barbiturates: NOT DETECTED
Benzodiazepines: NOT DETECTED
Cocaine: NOT DETECTED
Opiates: NOT DETECTED
Tetrahydrocannabinol: NOT DETECTED

## 2020-03-04 LAB — ETHANOL: Alcohol, Ethyl (B): <10 mg/dL (ref <10)

## 2020-03-04 LAB — CBG MONITORING, ED: Glucose-Capillary: 109 mg/dL — ABNORMAL HIGH (ref 70–99)

## 2020-03-04 MED ORDER — CLONAZEPAM 0.5 MG PO TABS: 0.5000 mg | ORAL_TABLET | Freq: Two times a day (BID) | ORAL | 0 refills | Status: DC | PRN

## 2020-03-04 MED ORDER — LIDOCAINE HCL (PF) 1 % IJ SOLN
5.0000 mL | Freq: Once | INTRAMUSCULAR | Status: AC
  Administered 2020-03-04: 5 mL via INTRADERMAL
  Filled 2020-03-04: qty 5

## 2020-03-04 MED ORDER — LORAZEPAM 2 MG/ML IJ SOLN: 1.0000 mg | Freq: Once | INTRAMUSCULAR | Status: DC | PRN

## 2020-03-04 MED ORDER — PROPOFOL 10 MG/ML IV BOLUS
INTRAVENOUS | Status: AC | PRN
  Administered 2020-03-04: 70 mg via INTRAVENOUS
  Administered 2020-03-04 (×2): 30 mg via INTRAVENOUS

## 2020-03-04 MED ORDER — FENTANYL CITRATE (PF) 100 MCG/2ML IJ SOLN
50.0000 ug | Freq: Once | INTRAMUSCULAR | Status: AC
  Administered 2020-03-04: 50 ug via INTRAVENOUS
  Filled 2020-03-04: qty 2

## 2020-03-04 MED ORDER — PROPOFOL 10 MG/ML IV BOLUS
1.0000 mg/kg | Freq: Once | INTRAVENOUS | Status: DC
  Filled 2020-03-04: qty 20

## 2020-03-04 MED ORDER — PROPOFOL 10 MG/ML IV BOLUS: 0.5000 mg/kg | Freq: Once | INTRAVENOUS | Status: DC

## 2020-03-04 NOTE — Discharge Instructions (Signed)
Please start taking Lamictal 50 mg twice a day instead of once a day. If you are having any mood changes please contact Dr. Karel Jarvis.  You will also be taking Klonopin twice a day for the next 2 days to decrease the potential for you to have a repeat seizure. Currently the dose of Lamictal that you are taking is not high enough to keep you from having seizures.  Return if you are having any new or worsening symptoms with your seizures.  Follow-up as soon as possible with the orthopedic doctor, Duwayne Heck to have your right shoulder evaluated.

## 2020-03-04 NOTE — Telephone Encounter (Signed)
Telephone call from pt father, Pt seen in the ED today for seizure, and dislocation of the right shoulder.   Pt had a seizure today around 6 am, Father took pt to the ED and they arrive around 7:30. This was the second time this week. Pt had no injuries from the first seizure on Monday.   Pt c/o: seizure Missed medications?  No. Sleep deprived?  Yes.   Alcohol intake?  No. Back to their usual baseline self?  Yes.  . If no, advise go to ER Current medications prescribed by Dr. Karel Jarvis: Lamotrigine 100 mg.   Pt is okay and speaking. Pt released from ED.

## 2020-03-04 NOTE — ED Triage Notes (Signed)
Pt here with possible right shoulder dislocation after having a seizure in the middle of the night hx of same

## 2020-03-04 NOTE — ED Provider Notes (Signed)
Reduction of dislocation  Date/Time: 03/04/2020 4:50 PM Performed by: Terald Sleeper, MD Authorized by: Terald Sleeper, MD  Consent: Verbal consent obtained. Written consent obtained. Risks and benefits: risks, benefits and alternatives were discussed Consent given by: patient Patient understanding: patient states understanding of the procedure being performed Patient consent: the patient's understanding of the procedure matches consent given Procedure consent: procedure consent matches procedure scheduled Relevant documents: relevant documents present and verified Test results: test results available and properly labeled Site marked: the operative site was marked Imaging studies: imaging studies available Patient identity confirmed: arm band, verbally with patient and hospital-assigned identification number Time out: Immediately prior to procedure a "time out" was called to verify the correct patient, procedure, equipment, support staff and site/side marked as required. Preparation: Patient was prepped and draped in the usual sterile fashion. Local anesthesia used: yes Anesthesia: local infiltration  Anesthesia: Local anesthesia used: yes Local Anesthetic: lidocaine 1% without epinephrine Anesthetic total: 10 mL  Sedation: Patient sedated: yes Sedation type: moderate (conscious) sedation Sedatives: propofol Vitals: Vital signs were monitored during sedation.  Patient tolerance: patient tolerated the procedure well with no immediate complications       Terald Sleeper, MD 03/04/20 [id number]

## 2020-03-04 NOTE — Sedation Documentation (Signed)
Reduction successful by Cammy Copa -PA

## 2020-03-04 NOTE — Telephone Encounter (Signed)
Pls let father know that as we talked about in the office, he is on a low dose of Lamotrigine. His father told me in October that he is only giving Lamotrigine 100mg  1/2 tablet every night. Pls confirm if this is still how he is taking it. We had talked that if he has seizures, would increase to 1/2 tablet twice a day. This is still a low dose, but we can start with this (since his father had been changing medications by himself in the past). Thanks

## 2020-03-04 NOTE — ED Provider Notes (Signed)
[facility] Provider Note   CSN: [id number] Arrival date & time: 03/04/20  0730     History Chief Complaint  Patient presents with  . Shoulder Injury  . Seizures    Stephen [name] is a 25 y.o. male with a past medical history of seizure disorder, recurrent right shoulder dislocation who presents emergency department after seizure.  He is brought in by his father.  Patient had a tonic-clonic seizure this morning.  Unsure how long it lasted.  He does not remember the event.  He did present with severe shoulder pain. He has been taking his medications as directed however he has had a seizure yesterday and then today.  Review of the patient's medical records show that his father manages his medications, he has had multiple complaints about the medications giving the patient anger issues and is currently only giving half of his dose of Lamictal.  Patient denies alcohol abuse.  He has no other triggers or medication changes that he can think of.  He denies any numbness or tingling of the right hand.  HPI     Past Medical History:  Diagnosis Date  . 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 Depression   . Seizures [facility](HCC)     Patient Active Problem List   Diagnosis Date Noted  . 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 Depression 12/21/2016  . Facial rash 10/26/2016  . Lethargy 10/26/2016  . Abnormal TSH 10/26/2016  . Mood changes 10/15/2016  . 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 Outbursts of anger 12/31/2015  . Encounter for long-term (current) use of high-risk medication 12/23/2014  . ODD (oppositional defiant disorder) 11/12/2014  . 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 MDD (major depressive disorder), recurrent episode, moderate (HCC) 11/12/2014  . 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 Seizure disorder (HCC) 08/31/2012    Past Surgical History:  Procedure Laterality Date  . CIRCUMCISION    . SHOULDER CLOSED REDUCTION Right 12/08/2018   Procedure: CLOSED REDUCTION SHOULDER;  Surgeon: Yolonda Kidaogers, Jason Patrick, MD;  [facility];  Service: Orthopedics;  Laterality: Right;       Family History  Problem Relation Age of Onset  . Diabetes gravidarum Paternal Grandmother   . Heart attack Paternal Grandmother   . Healthy Mother   . Healthy Father     Social History   Tobacco Use  . Smoking status: Never Smoker  . Smokeless tobacco: Never Used  Vaping Use  . Vaping Use: Every day  . Substances: Nicotine  Substance Use Topics  . Alcohol use: No  . Drug use: No    Home Medications Prior to Admission medications   Medication Sig Start Date End Date Taking? Authorizing Provider  FEXOFENADINE HCL PO Take 1 tablet by mouth daily.     [provider]  lamoTRIgine (LAMICTAL) 100 MG tablet Take 1/2 tablet every night Patient taking differently: Take 50 mg by mouth at bedtime.  11/29/19   Van ClinesAquino, Karen M, MD  traZODone (DESYREL) 50 MG tablet Take 1 tablet (50 mg total) by mouth at bedtime. 11/29/19   Van ClinesAquino, Karen M, MD    Allergies    Lactose intolerance (gi)  Review of Systems   Review of Systems Ten systems reviewed and are negative for acute change, except as noted in the HPI.   Physical Exam Updated Vital Signs BP 113/73  Pulse (!) 57   Temp 98.2 F (36.8 C) (Oral)   Resp 17   Ht 5\' 9"  (1.753 m)   Wt 71.7 kg   SpO2 100%   BMI 23.34 kg/m   Physical Exam Vitals and nursing note reviewed.  Constitutional:      General: He is not in acute distress.    Appearance: He is well-developed and well-nourished. He is not diaphoretic.  HENT:     Head: Normocephalic.     Comments: Small bruise over the left eyebrow Eyes:     General: No scleral icterus.    Conjunctiva/sclera: Conjunctivae normal.  Cardiovascular:     Rate and Rhythm:  Normal rate and regular rhythm.     Heart sounds: Normal heart sounds.  Pulmonary:     Effort: Pulmonary effort is normal. No respiratory distress.     Breath sounds: Normal breath sounds.  Abdominal:     Palpations: Abdomen is soft.     Tenderness: There is no abdominal tenderness.  Musculoskeletal:        General: No edema.     Cervical back: Normal range of motion and neck supple.     Comments: Right shoulder sitting anteriorly, positive sulcus sign, normal sensation of the right hand with 2+ radial pulse  Skin:    General: Skin is warm and dry.  Neurological:     Mental Status: He is alert.  Psychiatric:        Behavior: Behavior normal.     ED Results / Procedures / Treatments   Labs (all labs ordered are listed, but only abnormal results are displayed) Labs Reviewed  BASIC METABOLIC PANEL - Abnormal; Notable for the following components:      Result Value   Glucose, Bld 113 (*)    All other components within normal limits  CBG MONITORING, ED - Abnormal; Notable for the following components:   Glucose-Capillary 109 (*)    All other components within normal limits  CBC WITH DIFFERENTIAL/PLATELET  ETHANOL  RAPID URINE DRUG SCREEN, HOSP PERFORMED  LAMOTRIGINE LEVEL    EKG None  Radiology DG Shoulder Right  Result Date: 03/04/2020 CLINICAL DATA:  Dislocation? Seizure this morning with fall in bathroom.  Right shoulder injury. EXAM: RIGHT SHOULDER - 2+ VIEW COMPARISON:  12/20/2019 FINDINGS: Humeral head is anteriorly dislocated relative to the glenoid. No acute fracture. Visualized soft tissues are unremarkable. IMPRESSION: Anterior shoulder dislocation. Electronically Signed   By: 13/05/2019 M.D.   On: 03/04/2020 08:14   DG Shoulder Right Port  Result Date: 03/04/2020 CLINICAL DATA:  Right shoulder dislocation.  Attempted reduction. EXAM: PORTABLE RIGHT SHOULDER COMPARISON:  Earlier same day FINDINGS: Single AP view of the right shoulder shows persistent humeral  head dislocation. No evidence for an associated fracture. Acromioclavicular and coracoclavicular distances are preserved. IMPRESSION: Persistent dislocation of the right humeral head. Electronically Signed   By: 03/06/2020 M.D.   On: 03/04/2020 10:49    Procedures Procedures (including critical care time)  Medications Ordered in ED Medications  LORazepam (ATIVAN) injection 1 mg (has no administration in time range)  propofol (DIPRIVAN) 10 mg/mL bolus/IV push 71.7 mg (0 mg Intravenous Hold 03/04/20 1214)  fentaNYL (SUBLIMAZE) injection 50 mcg (50 mcg Intravenous Given 03/04/20 0955)  lidocaine (PF) (XYLOCAINE) 1 % injection 5 mL (5 mLs Intradermal Given 03/04/20 0955)  lidocaine (PF) (XYLOCAINE) 1 % injection 5 mL (5 mLs Intradermal Given by Other 03/04/20 0955)  propofol (DIPRIVAN) 10 mg/mL bolus/IV push (30 mg Intravenous  Given 03/04/20 1158)    ED Course  I have reviewed the triage vital signs and the nursing notes.  Pertinent labs & imaging results that were available during my care of the patient were reviewed by me and considered in my medical decision making (see chart for details).  Clinical Course as of 03/04/20 1434  Tue Mar 04, 2020  1058 Attempted and unsuccessful at bedside reduction with lidocaine intraarticular injection and fentanyl.  Patient with low tolerance for pain or any manipulation.  We'll need to do CS with propofol. [MT]  1256 Successful reduction of dislocation after propofol sedation.  Patient now awake and returned to baseline status.  Neurovascularly intact.  PA provider has spoken to neurology regarding seizure - see PA note.   [MT]    Clinical Course User Index [MT] Terald Sleeper, MD   MDM Rules/Calculators/A&P                          [id number] VS: BP 133/87   Pulse 84   Temp 98.5 F (36.9 C) (Oral)   Resp 18   Ht 5\' 9"  (1.753 m)   Wt 71.7 kg   SpO2 100%   BMI 23.34 kg/m   is gathered by patient and EMR. Previous records  obtained and reviewed. DDX:The patient's complaint of seizure involves an extensive number of diagnostic and treatment options, and is a complaint that carries with it a high risk of complications, morbidity, and potential mortality. Given the large differential diagnosis, medical decision making is of high complexity. The differential diagnosis for includes but is not limited to idiopathic seizure, traumatic brain injury, intracranial hemorrhage, vascular lesion, mass or space containing lesion, degenerative neurologic disease, congenital brain abnormality, infectious etiology such as meningitis, encephalitis or abscess, metabolic disturbance including hyper or hypoglycemia, hyper or hyponatremia, hyperosmolar state, uremia, hepatic failure, hypocalcemia, hypomagnesemia.  Toxic substances such as cocaine, lidocaine, antidepressants, theophylline, alcohol withdrawal, drug withdrawal, eclampsia, hypertensive encephalopathy and anoxic brain injury.  Labs: I ordered reviewed and interpreted labs which include  CBC wnl Mildly elevated glucose on BMP without other abnormality likely acute phase reaction, negative EtOH level, UDS is negative. Bmp elevated glucose likely acute phase reaction due to Imaging: I ordered and reviewed images which included multiple right shoulder x-rays. I independently visualized and interpreted all imaging. Significant findings include recurrent anterior shoulder dislocation with successful reduction.  EKG: Consults: Case discussed with Dr. [id number] who recommends that the patient increase his Lamictal intake to 50 mg twice a day and Klonopin 0.5 mg twice daily for 2 days MDM: Patient here with 2 seizures over the past 48 hours.  He has an anterior right shoulder dislocation which was successfully reduced here in the emergency department.  Patient will need follow-up with orthopedist for his right shoulder.  He may follow-up with Dr. Amada Jupiter regarding his seizures.  No recurrent  seizure here in the emergency department. Patient disposition:The patient appears reasonably screened and/or stabilized for discharge and I doubt any other medical condition or other [facility] requiring further screening, evaluation, or treatment in the ED at this time prior to discharge. I have discussed lab and/or imaging findings with the patient and answered all questions/concerns to the best of my ability.I have discussed return precautions and OP follow up.    Final Clinical Impression(s) / ED Diagnoses Final diagnoses:  None    Rx / DC Orders ED Discharge Orders    None  Arthor Captain, [name] 03/04/20 1539    Terald Sleeper, MD 03/04/20 [phone]

## 2020-03-05 LAB — LAMOTRIGINE LEVEL: Lamotrigine Lvl: 3.2 ug/mL (ref 2.0–20.0)

## 2020-03-05 NOTE — Telephone Encounter (Signed)
Father advise of Dr.Aquino's recommendation.
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
## 2020-05-06 ENCOUNTER — Telehealth: Payer: Self-pay | Admitting: Neurology

## 2020-05-06 ENCOUNTER — Other Ambulatory Visit: Payer: Self-pay

## 2020-05-06 MED ORDER — LAMOTRIGINE 100 MG PO TABS: ORAL_TABLET | ORAL | 3 refills | Status: DC

## 2020-05-06 MED ORDER — TRAZODONE HCL 50 MG PO TABS: 50.0000 mg | ORAL_TABLET | Freq: Every day | ORAL | 11 refills | Status: DC

## 2020-05-06 NOTE — Telephone Encounter (Signed)
Patient called in stating he needs a 90 day refill of his lamotrigine and trazodone

## 2020-06-09 DIAGNOSIS — Z0271 Encounter for disability determination: Secondary | ICD-10-CM

## 2020-08-06 ENCOUNTER — Emergency Department (HOSPITAL_COMMUNITY)
Admission: EM | Admit: 2020-08-06 | Discharge: 2020-08-07 | Disposition: A | Discharge status: Home / Self Care | Payer: 59 | Attending: Emergency Medicine | Admitting: Emergency Medicine

## 2020-08-06 DIAGNOSIS — Z8669 Personal history of other diseases of the nervous system and sense organs: Secondary | ICD-10-CM

## 2020-08-06 DIAGNOSIS — G40909 Epilepsy, unspecified, not intractable, without status epilepticus: Secondary | ICD-10-CM | POA: Insufficient documentation

## 2020-08-06 DIAGNOSIS — R569 Unspecified convulsions: Secondary | ICD-10-CM | POA: Diagnosis present

## 2020-08-06 DIAGNOSIS — M25511 Pain in right shoulder: Secondary | ICD-10-CM | POA: Insufficient documentation

## 2020-08-06 DIAGNOSIS — W19XXXA Unspecified fall, initial encounter: Secondary | ICD-10-CM | POA: Diagnosis not present

## 2020-08-07 ENCOUNTER — Other Ambulatory Visit: Payer: Self-pay

## 2020-08-07 ENCOUNTER — Emergency Department (HOSPITAL_COMMUNITY): Payer: 59

## 2020-08-07 NOTE — ED Provider Notes (Signed)
MSE was initiated and I personally evaluated the patient and placed orders (if any) at  12:11 AM on August 07, 2020.  Patient with h/o sz DO, had a seizure today, fell onto right shoulder and feels he has a recurrent dislocation. No other injury. He reports taking his seizure medication regularly without missed doses, but reports he has not been sleeping, which is new for him.   Today's Vitals   08/06/20 2253 08/07/20 0008  BP: 138/90   Pulse: (!) 103   Resp: 16   Temp: 98.3 F (36.8 C)   TempSrc: Oral   SpO2: 100%   Weight:  75 kg  Height:  5\' 9"  (1.753 m)  PainSc:  8    Body mass index is 24.42 kg/m.  Right UE neurovascularly intact. ?stepoff deformity right shoulder Distal pulses present  The patient appears stable so that the remainder of the MSE may be completed by another provider.   , [name] 08/07/20 0013    08/09/20, MD 08/07/20 [phone]

## 2020-08-07 NOTE — Discharge Instructions (Addendum)
Follow up with your doctor for further evaluation of seizures.   Apply warm compresses to the right shoulder and take ibuprofen for pain as needed.

## 2020-08-07 NOTE — ED Provider Notes (Signed)
[facility] EMERGENCY DEPARTMENT Provider Note   CSN: [id number] Arrival date & time: 08/06/20  2211     History Chief Complaint  Patient presents with   Seizure/Shoulder Injury    Stephen [name] is a 25 y.o. male.  Patient with h/o sz DO, had a seizure today, fell onto right shoulder and feels he has a recurrent dislocation. No other injury. He reports taking his seizure medication regularly without missed doses, but reports he has not been sleeping, which is new for him. No recent illness or fever. No head injury from the episode today.     The history is provided by the patient. No language interpreter was used.      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   Outbursts of anger 12/31/2015   Encounter for long-term (current) use of high-risk medication 12/23/2014   ODD (oppositional defiant disorder) 11/12/2014   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   MDD (major depressive disorder), recurrent episode, moderate (HCC) 11/12/2014   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   Seizure disorder (HCC) 08/31/2012    Past Surgical History:  Procedure Laterality Date   CIRCUMCISION     SHOULDER CLOSED REDUCTION Right 12/08/2018   Procedure: CLOSED REDUCTION SHOULDER;  Surgeon: Yolonda Kida, MD;  Location: [facility] OR;  Service: Orthopedics;  Laterality: Right;       Family History  Problem Relation Age of Onset   Diabetes gravidarum Paternal Grandmother    Heart attack Paternal  Grandmother    Healthy Mother    Healthy Father     Social History   Tobacco Use   Smoking status: Never   Smokeless tobacco: Never  Vaping Use   Vaping Use: Every day   Substances: Nicotine  Substance Use Topics   Alcohol use: No   Drug use: No    Home Medications Prior to Admission medications   Medication Sig Start Date End Date Taking? Authorizing Provider  clonazePAM (KLONOPIN) 0.5 MG tablet Take 1 tablet (0.5 mg total) by mouth 2 (two) times daily as needed for up to 2 days for anxiety. 03/04/20 03/06/20  Arthor Captain, [name]  FEXOFENADINE HCL PO Take 1 tablet by mouth daily.     [provider]  lamoTRIgine (LAMICTAL) 100 MG tablet Take 1/2 tablet every night 05/06/20   Van Clines, MD  traZODone (DESYREL) 50 MG tablet Take 1 tablet (50 mg total) by mouth at bedtime. 05/06/20   Van Clines, MD    Allergies    Lactose intolerance (gi)  Review of Systems   Review of Systems  Constitutional:  Negative for fever.  Respiratory:  Negative for shortness of breath.   Cardiovascular:  Negative for chest pain.  Gastrointestinal:  Negative for abdominal pain.  Musculoskeletal:  Negative for neck pain.       See HPI.  Skin:  Negative for wound.  Neurological:  Positive for seizures. Negative for headaches.   Physical Exam Updated Vital Signs BP 133/88   Pulse (!) 102   Temp  98.3 F (36.8 C) (Oral)   Resp 16   Ht 5\' 9"  (1.753 m)   Wt 75 kg   SpO2 100%   BMI 24.42 kg/m   Physical Exam Vitals and nursing note reviewed.  Constitutional:      Appearance: He is well-developed.  Pulmonary:     Effort: Pulmonary effort is normal.  Musculoskeletal:        General: Normal range of motion.     Cervical back: Normal range of motion.     Comments: No definite deformity of right shoulder. Holding in adduction. Distal pulses present.   Skin:    General: Skin is warm and dry.  Neurological:     Mental Status: He is alert and oriented to person, place, and  time.     Sensory: No sensory deficit.    ED Results / Procedures / Treatments   Labs (all labs ordered are listed, but only abnormal results are displayed) Labs Reviewed - No data to display  EKG None  Radiology DG Shoulder Right  Result Date: 08/07/2020 CLINICAL DATA:  Fall, right shoulder pain EXAM: RIGHT SHOULDER - 2+ VIEW COMPARISON:  None. FINDINGS: There is no evidence of fracture or dislocation. There is no evidence of arthropathy or other focal bone abnormality. Soft tissues are unremarkable. IMPRESSION: Negative. Electronically Signed   By: 08/09/2020 MD   On: 08/07/2020 00:58    Procedures Procedures   Medications Ordered in ED Medications - No data to display  ED Course  I have reviewed the triage vital signs and the nursing notes.  Pertinent labs & imaging results that were available during my care of the patient were reviewed by me and considered in my medical decision making (see chart for details).    MDM Rules/Calculators/A&P                          Patient to ED with concern for right shoulder dislocation after seizure.   Right shoulder xray negative for dislocation. The patient has been observed in the ED for 6 hours and has not had a recurrent seizure. He can be discharged home. Encouraged to follow up with his neurologist for further management of seizure disorder.   Final Clinical Impression(s) / ED Diagnoses Final diagnoses:  None   Seizure History of seizures Right shoulder pain  Rx / DC Orders ED Discharge Orders     None        08/09/2020 08/07/20 0509    08/09/20, MD 08/07/20 [phone]

## 2020-08-07 NOTE — ED Triage Notes (Signed)
Patient reports seizure last night and injured his right shoulder when he fell , pain increases with movement .

## 2020-08-28 ENCOUNTER — Encounter (HOSPITAL_COMMUNITY): Payer: Self-pay

## 2020-08-28 ENCOUNTER — Emergency Department (HOSPITAL_COMMUNITY)
Admission: EM | Admit: 2020-08-28 | Discharge: 2020-08-28 | Disposition: A | Discharge status: Home / Self Care | Payer: 59 | Attending: Emergency Medicine | Admitting: Emergency Medicine

## 2020-08-28 ENCOUNTER — Emergency Department (HOSPITAL_COMMUNITY): Payer: 59

## 2020-08-28 ENCOUNTER — Other Ambulatory Visit: Payer: Self-pay

## 2020-08-28 DIAGNOSIS — G40909 Epilepsy, unspecified, not intractable, without status epilepticus: Secondary | ICD-10-CM | POA: Diagnosis not present

## 2020-08-28 DIAGNOSIS — R569 Unspecified convulsions: Secondary | ICD-10-CM

## 2020-08-28 DIAGNOSIS — M25511 Pain in right shoulder: Secondary | ICD-10-CM | POA: Insufficient documentation

## 2020-08-28 DIAGNOSIS — G8929 Other chronic pain: Secondary | ICD-10-CM | POA: Diagnosis not present

## 2020-08-28 MED ORDER — KETOROLAC TROMETHAMINE 60 MG/2ML IM SOLN
60.0000 mg | Freq: Once | INTRAMUSCULAR | Status: AC
  Administered 2020-08-28: 60 mg via INTRAMUSCULAR
  Filled 2020-08-28: qty 2

## 2020-08-28 NOTE — ED Triage Notes (Addendum)
Patient reports seizure last night. Complaining of right shoulder pain, no deformity noted. Patient denies head/neck pain. Patient states hx of seizure and takes his medication as prescribed by neurologist.

## 2020-08-28 NOTE — ED Notes (Signed)
An After Visit Summary was printed and given to the patient. Discharge instructions given and no further questions at this time.  

## 2020-08-28 NOTE — ED Provider Notes (Signed)
[facility]-EMERGENCY DEPT Provider Note   CSN: [id number] Arrival date & time: 08/28/20  0949     History Chief Complaint  Patient presents with   Shoulder Injury    Stephen [name] is a 25 y.o. male.  Patient is a 25 year old male with a history of seizure disorder on lamotrigine and trazodone, depression, OCD, chronic right shoulder dislocation who is presenting today with complaint of right shoulder pain.  Patient reports last night he was lying in bed and he had a seizure.  He started feeling very shaky all over and the does not remember what happened.  When he woke up he was having significant right shoulder pain which has continued.  He has had no further seizures that he is aware of.  He has no other pain except for his right shoulder.  He has been taking his seizure medication as prescribed.  He denies any recent illness, poor oral intake, sleep deprivation.  He denies any drug use but does report that he was vaping nicotine last night when his symptoms occurred.  The history is provided by the patient and medical records.  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   Outbursts of anger 12/31/2015   Encounter for long-term (current) use of high-risk medication 12/23/2014   ODD (oppositional defiant disorder) 11/12/2014   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   MDD (major depressive disorder), recurrent episode, moderate (HCC) 11/12/2014   Generalized convulsive seizure  (HCC) 08/31/2012   Seizure disorder (HCC) 08/31/2012    Past Surgical History:  Procedure Laterality Date   CIRCUMCISION     SHOULDER CLOSED REDUCTION Right 12/08/2018   Procedure: CLOSED REDUCTION SHOULDER;  Surgeon: Yolonda Kida, MD;  Location: [facility] OR;  Service: Orthopedics;  Laterality: Right;       Family History  Problem Relation Age of Onset   Diabetes gravidarum Paternal Grandmother    Heart attack Paternal Grandmother    Healthy Mother    Healthy Father     Social History   Tobacco Use   Smoking status: Never   Smokeless tobacco: Never  Vaping Use   Vaping Use: Every day   Substances: Nicotine  Substance Use Topics   Alcohol use: No   Drug use: No    Home Medications Prior to Admission medications   Medication Sig Start Date End Date Taking? Authorizing Provider  clonazePAM (KLONOPIN) 0.5 MG tablet Take 1 tablet (0.5 mg total) by mouth 2 (two) times daily as needed for up to 2 days for anxiety. 03/04/20 03/06/20  Arthor Captain, [name]  FEXOFENADINE HCL PO Take 1 tablet by mouth daily.     [provider]  lamoTRIgine (LAMICTAL) 100 MG tablet Take 1/2 tablet every night 05/06/20   Van Clines, MD  traZODone (DESYREL) 50 MG tablet Take 1 tablet (50 mg total) by mouth at bedtime. 05/06/20   Van Clines, MD    Allergies    Lactose intolerance (gi)  Review of Systems  Review of Systems  All other systems reviewed and are negative.  Physical Exam Updated Vital Signs BP 125/78   Pulse 94   Temp 97.8 F (36.6 C) (Oral)   Resp 18   Ht 5\' 9"  (1.753 m)   Wt 75 kg   SpO2 100%   BMI 24.42 kg/m   Physical Exam Vitals and nursing note reviewed.  Constitutional:      General: He is not in acute distress.    Appearance: He is well-developed.  HENT:     Head: Normocephalic and atraumatic.     Mouth/Throat:     Mouth: Mucous membranes are moist.     Comments: No bite marks on the tongue Eyes:     Conjunctiva/sclera: Conjunctivae  normal.     Pupils: Pupils are equal, round, and reactive to light.  Cardiovascular:     Rate and Rhythm: Normal rate and regular rhythm.     Pulses: Normal pulses.     Heart sounds: No murmur heard. Pulmonary:     Effort: Pulmonary effort is normal. No respiratory distress.     Breath sounds: Normal breath sounds. No wheezing or rales.  Abdominal:     General: There is no distension.     Palpations: Abdomen is soft.     Tenderness: There is no abdominal tenderness. There is no guarding or rebound.  Musculoskeletal:        General: No deformity. Normal range of motion.     Cervical back: Normal range of motion and neck supple.     Right lower leg: No edema.     Left lower leg: No edema.     Comments: Tenderness with palpation of the humeral head of the shoulder.  No obvious deformity.  Range of motion intact.  No scapular or clavicular tenderness.  2+ right radial pulse.  Sensation in the hand is intact.  Skin:    General: Skin is warm and dry.     Capillary Refill: Capillary refill takes less than 2 seconds.     Findings: No erythema or rash.  Neurological:     Mental Status: He is alert and oriented to person, place, and time. Mental status is at baseline.     Sensory: No sensory deficit.     Motor: No weakness.     Gait: Gait normal.  Psychiatric:        Mood and Affect: Affect is flat.        Behavior: Behavior is withdrawn. Behavior is cooperative.     Comments: Slightly guarded, poor eye contact.    ED Results / Procedures / Treatments   Labs (all labs ordered are listed, but only abnormal results are displayed) Labs Reviewed - No data to display  EKG None  Radiology DG Shoulder Right Portable  Result Date: 08/28/2020 CLINICAL DATA:  Right shoulder pain.  Seizure EXAM: PORTABLE RIGHT SHOULDER COMPARISON:  None. FINDINGS: There is no evidence of fracture or dislocation. There is no evidence of arthropathy or other focal bone abnormality. Soft tissues are  unremarkable. IMPRESSION: Negative. Electronically Signed   By: 08/30/2020 M.D.   On: 08/28/2020 10:56    Procedures Procedures   Medications Ordered in ED Medications  ketorolac (TORADOL) injection 60 mg (has no administration in time range)    ED Course  I have reviewed the triage vital signs and the nursing notes.  Pertinent labs & imaging results that were available during my care of the patient were reviewed by me and  considered in my medical decision making (see chart for details).    MDM Rules/Calculators/A&P                          25 year old male presenting today with complaint of right shoulder pain.  This started after having what sounds like a seizure last night.  Patient was vaping right before having the seizure.  He denies falling off the bed or having any other areas of injury.  He has a chronic, recurrent right shoulder dislocation.  Last year he had 5 episodes in a 30-month period.  Last shoulder dislocation was in November of last year.  Patient does report that he still taking the lamotrigine and trazodone and has not missed any doses.  He denies any recent illness.  Vital signs are reassuring.  Neurologically he is intact.  No significant deformity of his right shoulder at this time and could have dislocated and relocated prior to arrival.  We will do imaging and patient given pain control.  11:30 AM Pt feeling better.  Imaging is neg.    MDM   Amount and/or Complexity of Data Reviewed Tests in the radiology section of CPT: ordered and reviewed Independent visualization of images, tracings, or specimens: yes     Final Clinical Impression(s) / ED Diagnoses Final diagnoses:  Chronic right shoulder pain  Seizure [facility])    Rx / DC Orders ED Discharge Orders     None        Gwyneth Sprout, MD 08/28/20 1132

## 2020-08-28 NOTE — Discharge Instructions (Addendum)
Take tylenol/ibuprofen as needed for pain in the shoulder.  No dislocation on the x-ray today.

## 2020-11-06 ENCOUNTER — Other Ambulatory Visit: Payer: Self-pay

## 2020-11-06 ENCOUNTER — Ambulatory Visit (HOSPITAL_COMMUNITY)
Admission: EM | Admit: 2020-11-06 | Discharge: 2020-11-06 | Disposition: A | Discharge status: Home / Self Care | Payer: 59

## 2020-11-06 DIAGNOSIS — F411 Generalized anxiety disorder: Secondary | ICD-10-CM | POA: Diagnosis not present

## 2020-11-06 DIAGNOSIS — F529 Unspecified sexual dysfunction not due to a substance or known physiological condition: Secondary | ICD-10-CM | POA: Diagnosis not present

## 2020-11-06 NOTE — Discharge Summary (Signed)
Stephen [name] to be D/C'd Home per NP order. Discussed with the patient and all questions fully answered. An After Visit Summary was printed and given to the patient. Patient escorted out  and D/C home via private auto.  Dickie La  11/06/2020 5:52 PM

## 2020-11-06 NOTE — BH Assessment (Addendum)
Pt walked to police in community requesting to come to [facility] for evalutation due to "my penis gets really hard in public". Denies having any mental health concerns. No SI, HI, AVH or substance use. [employer] states that pt reported being convicted for getting an erection while looking at men and requested to come here for treatment.   Pt is routine.

## 2020-11-06 NOTE — ED Provider Notes (Signed)
Behavioral Health Urgent Care Medical Screening Exam  Patient Name: Stephen [name] MRN: [id number] Date of Evaluation: 11/06/20 Chief Complaint:   Diagnosis:  Final diagnoses:  Sexual arousal disorder  GAD (generalized anxiety disorder)    History of Present illness: Stephen [name] is a 25 y.o. male  patient presented to [facility] as a walk in voluntarily by [employer] with complaints of "my penis gets really hard in public".  Stephen [name], 89 y.o., male patient seen face to face by this provider, consulted with Dr. Bronwen Betters; and chart reviewed on 11/06/20.  On evaluation Stephen [name] reports he walked up to a police officer today and requested to come to the [facility] for evaluation.  Reports he gets erections at inappropriate times.  States he gets erections while looking at women. Today he became for distressed when he got an erection talking to a man. Erections happen through out the day. States it is hard for him to do anything due to the erections. He masturbates 2 times a day.  Patient is requesting to be admitted into the inpatient psychiatric facility.  Reports he does not have a psychiatric history.  Per chart review patient has psychiatric history of MDD and anxiety.  He does have a seizure disorder, last seizure 1 month ago.  States he has a neurologist that has him on medications, unable to name medication.  Denies taking any other medications at this time.  Denies any recent triggers.  Denies any alcohol or substance use.  Discussed/reviewed outpatient psychiatric resources including but have local to the [facility] behavioral health outpatient services.  Discussed PHP program patient is willing to engage.  During evaluation Stephen [name] is in sitting position in no acute distress.  He makes fair eye contact.  Speech is clear, coherent, normal rate and tone.  He is alert and oriented x4 and cooperative.  Endorses anxiety, because he never knows when the erection is going to happen.  Denies  depression.  Reports decrease in appetite and sleep.  He is thought process is coherent and relevant; There is no indication that he is currently responding to internal/external stimuli or experiencing delusional thought content; and he has denied suicidal/self-harm/homicidal ideation, psychosis, and paranoia.  Patient answered questions appropriately.    At this time Stephen [name] is educated and verbalizes understanding of mental health resources and other crisis services in the community.  He is instructed to call 911 and present to the nearest emergency room should he experience any suicidal/homicidal ideation, auditory/visual/hallucinations, or detrimental worsening of he is r mental health condition.  He was a also advised by Clinical research associate that he could call the toll-free phone on insurance card to assist with identifying in network counselors and agencies or number on back of Medicaid card t speak with care coordinator   Psychiatric Specialty Exam  Presentation  General Appearance:Appropriate for Environment; Fairly Groomed  Eye Contact:Fair  Speech:Clear and Coherent; Normal Rate  Speech Volume:Normal  Handedness:Right   Mood and Affect  Mood:Anxious  Affect:Appropriate   Thought Process  Thought Processes:Coherent  Descriptions of Associations:Intact  Orientation:Full (Time, Place and Person)  Thought Content:Logical  Diagnosis of Schizophrenia or Schizoaffective disorder in past: No  Duration of Psychotic Symptoms: Greater than six months  Hallucinations:None  Ideas of Reference:None  Suicidal Thoughts:No  Homicidal Thoughts:No   Sensorium  Memory:Immediate Good; Recent Good; Remote Good  Judgment:Fair  Insight:Fair   Executive Functions  Concentration:Good  Attention Span:Good  Recall:Good  Fund of Knowledge:Good  Language:Good   Psychomotor Activity  Psychomotor Activity:Normal   Assets  Assets:Communication Skills; Desire for Improvement;  Financial Resources/Insurance; Physical Health; Resilience   Sleep  Sleep:Fair  Number of hours: 6   No data recorded  Physical Exam: Physical Exam Vitals and nursing note reviewed.  Constitutional:      Appearance: Normal appearance. He is well-developed.  HENT:     Head: Normocephalic and atraumatic.  Eyes:     General:        Right eye: No discharge.        Left eye: No discharge.     Conjunctiva/sclera: Conjunctivae normal.  Cardiovascular:     Rate and Rhythm: Normal rate.     Heart sounds: No murmur heard. Pulmonary:     Effort: Pulmonary effort is normal. No respiratory distress.  Musculoskeletal:     Cervical back: Neck supple.  Skin:    Coloration: Skin is not jaundiced or pale.  Neurological:     Mental Status: He is alert and oriented to person, place, and time.  Psychiatric:        Attention and Perception: Attention and perception normal.        Mood and Affect: Mood is anxious.        Speech: Speech normal.        Behavior: Behavior normal. Behavior is cooperative.        Thought Content: Thought content normal.        Cognition and Memory: Cognition normal.        Judgment: Judgment is impulsive.   Review of Systems  Constitutional: Negative.   HENT: Negative.    Eyes: Negative.   Respiratory: Negative.    Cardiovascular: Negative.   Musculoskeletal: Negative.   Skin: Negative.   Neurological:  Negative for seizures.  Psychiatric/Behavioral:  The patient is nervous/anxious.   Blood pressure 120/81, pulse 79, temperature 98.7 F (37.1 C), temperature source Oral, resp. rate 16, SpO2 100 %. There is no height or weight on file to calculate BMI.  Musculoskeletal: Strength & Muscle Tone: within normal limits Gait & Station: normal Patient leans: N/A   [facility] MSE Discharge Disposition for Follow up and Recommendations: Based on my evaluation the patient does not appear to have an emergency medical condition and can be discharged with resources  and follow up care in outpatient services for Medication Management and Partial Hospitalization Program  Discharge patient.  Patient does not meet inpatient psychiatric admission criteria.  Provided outpatient psychiatric resources for [facility] behavioral health outpatient services on second floor including open access walk-in hours.  Made referral for PHP program to [facility].   No evidence of imminent risk to self or others at present.    Patient does not meet criteria for psychiatric inpatient admission. Discussed crisis plan, support from social network, calling 911, coming to the Emergency Department, and calling Suicide Hotline.     Ardis Hughs, NP 11/06/2020, 5:59 PM

## 2020-11-06 NOTE — Discharge Instructions (Addendum)

## 2020-11-17 ENCOUNTER — Encounter (HOSPITAL_COMMUNITY): Payer: Self-pay

## 2020-11-17 ENCOUNTER — Other Ambulatory Visit: Payer: Self-pay

## 2020-11-17 ENCOUNTER — Emergency Department (HOSPITAL_COMMUNITY)
Admission: EM | Admit: 2020-11-17 | Discharge: 2020-11-17 | Disposition: A | Discharge status: Home / Self Care | Payer: 59 | Attending: Emergency Medicine | Admitting: Emergency Medicine

## 2020-11-17 ENCOUNTER — Emergency Department (HOSPITAL_COMMUNITY): Payer: 59

## 2020-11-17 DIAGNOSIS — S40011A Contusion of right shoulder, initial encounter: Secondary | ICD-10-CM | POA: Diagnosis not present

## 2020-11-17 DIAGNOSIS — F17210 Nicotine dependence, cigarettes, uncomplicated: Secondary | ICD-10-CM | POA: Diagnosis not present

## 2020-11-17 DIAGNOSIS — M25511 Pain in right shoulder: Secondary | ICD-10-CM | POA: Diagnosis not present

## 2020-11-17 DIAGNOSIS — G8929 Other chronic pain: Secondary | ICD-10-CM | POA: Insufficient documentation

## 2020-11-17 DIAGNOSIS — R569 Unspecified convulsions: Secondary | ICD-10-CM | POA: Insufficient documentation

## 2020-11-17 DIAGNOSIS — S4991XA Unspecified injury of right shoulder and upper arm, initial encounter: Secondary | ICD-10-CM | POA: Diagnosis present

## 2020-11-17 DIAGNOSIS — W19XXXA Unspecified fall, initial encounter: Secondary | ICD-10-CM | POA: Insufficient documentation

## 2020-11-17 LAB — CBC WITH DIFFERENTIAL/PLATELET
Abs Immature Granulocytes: 0.01 10*3/uL (ref 0.00–0.07)
Basophils Absolute: 0 10*3/uL (ref 0.0–0.1)
Basophils Relative: 1 %
Eosinophils Absolute: 0.1 10*3/uL (ref 0.0–0.5)
Eosinophils Relative: 2 %
HCT: 41.3 % (ref 39.0–52.0)
Hemoglobin: 12.9 g/dL — ABNORMAL LOW (ref 13.0–17.0)
Immature Granulocytes: 0 %
Lymphocytes Relative: 24 %
Lymphs Abs: 1.3 10*3/uL (ref 0.7–4.0)
MCH: 29 pg (ref 26.0–34.0)
MCHC: 31.2 g/dL (ref 30.0–36.0)
MCV: 92.8 fL (ref 80.0–100.0)
Monocytes Absolute: 0.4 10*3/uL (ref 0.1–1.0)
Monocytes Relative: 8 %
Neutro Abs: 3.5 10*3/uL (ref 1.7–7.7)
Neutrophils Relative %: 65 %
Platelets: 241 10*3/uL (ref 150–400)
RBC: 4.45 MIL/uL (ref 4.22–5.81)
RDW: 12.8 % (ref 11.5–15.5)
WBC: 5.4 10*3/uL (ref 4.0–10.5)
nRBC: 0 % (ref 0.0–0.2)

## 2020-11-17 LAB — BASIC METABOLIC PANEL
Anion gap: 9 (ref 5–15)
BUN: 9 mg/dL (ref 6–20)
CO2: 25 mmol/L (ref 22–32)
Calcium: 9 mg/dL (ref 8.9–10.3)
Chloride: 104 mmol/L (ref 98–111)
Creatinine, Ser: 0.73 mg/dL (ref 0.61–1.24)
GFR, Estimated: >60 mL/min (ref >60)
Glucose, Bld: 98 mg/dL (ref 70–99)
Potassium: 3.8 mmol/L (ref 3.5–5.1)
Sodium: 138 mmol/L (ref 135–145)

## 2020-11-17 MED ORDER — LEVETIRACETAM IN NACL 1000 MG/100ML IV SOLN
1000.0000 mg | Freq: Once | INTRAVENOUS | Status: AC
  Administered 2020-11-17: 1000 mg via INTRAVENOUS
  Filled 2020-11-17: qty 100

## 2020-11-17 MED ORDER — NAPROXEN 500 MG PO TABS: ORAL_TABLET | ORAL | 0 refills | Status: DC

## 2020-11-17 NOTE — ED Provider Notes (Signed)
[facility] EMERGENCY DEPARTMENT Provider Note   CSN: [id number] Arrival date & time: 11/17/20  0865     History Chief Complaint  Patient presents with   Seizures   Shoulder Pain    Stephen [name] is a 25 y.o. male.  Patient has a history of seizures.  He had a seizure and hurt his right shoulder.  He had chronic shoulder problems.  He presently takes 1 Keppra once a day.  He does not know the dose  The history is provided by the patient and medical records. No language interpreter was used.  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   Outbursts of anger 12/31/2015   Encounter for long-term (current) use of high-risk medication 12/23/2014   ODD (oppositional defiant disorder) 11/12/2014   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   MDD (major depressive disorder), recurrent episode, moderate (HCC) 11/12/2014   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   Seizure disorder (HCC) 08/31/2012    Past Surgical History:  Procedure Laterality Date   CIRCUMCISION     SHOULDER CLOSED REDUCTION Right 12/08/2018    Procedure: CLOSED REDUCTION SHOULDER;  Surgeon: Yolonda Kida, MD;  Location: [facility] OR;  Service: Orthopedics;  Laterality: Right;       Family History  Problem Relation Age of Onset   Diabetes gravidarum Paternal Grandmother    Heart attack Paternal Grandmother    Healthy Mother    Healthy Father     Social History   Tobacco Use   Smoking status: Every Day    Types: Cigarettes   Smokeless tobacco: Never  Vaping Use   Vaping Use: Every day   Substances: Nicotine  Substance Use Topics   Alcohol use: No   Drug use: No    Home Medications Prior to Admission medications   Medication Sig Start Date End Date Taking? Authorizing Provider  naproxen (NAPROSYN) 500 MG tablet Take one twice a day if needed for shoulder pain 11/17/20  Yes Bethann Berkshire, MD  clonazePAM (KLONOPIN) 0.5 MG tablet Take 1 tablet (0.5 mg total) by mouth 2 (two) times daily as needed for up to 2 days for anxiety. 03/04/20 03/06/20  Arthor Captain, [name]  FEXOFENADINE HCL PO Take 1 tablet by mouth daily.     [provider]  lamoTRIgine (LAMICTAL) 100 MG tablet Take 1/2 tablet every night Patient taking differently: Take 50 mg by mouth at bedtime. 05/06/20   Van Clines, MD  traZODone (DESYREL) 50 MG tablet Take 1 tablet (50 mg total) by mouth at bedtime. 05/06/20   Van Clines, MD    Allergies    Lactose  intolerance (gi)  Review of Systems   Review of Systems  Constitutional:  Negative for appetite change and fatigue.  HENT:  Negative for congestion, ear discharge and sinus pressure.   Eyes:  Negative for discharge.  Respiratory:  Negative for cough.   Cardiovascular:  Negative for chest pain.  Gastrointestinal:  Negative for abdominal pain and diarrhea.  Genitourinary:  Negative for frequency and hematuria.  Musculoskeletal:  Negative for back pain.  Skin:  Negative for rash.  Neurological:  Positive for seizures. Negative for headaches.  Psychiatric/Behavioral:  Negative for  hallucinations.    Physical Exam Updated Vital Signs BP (!) 119/101   Pulse 61   Temp 97.8 F (36.6 C)   Resp 19   Ht 5\' 9"  (1.753 m)   Wt 68 kg   SpO2 100%   BMI 22.15 kg/m   Physical Exam Vitals and nursing note reviewed.  Constitutional:      Appearance: He is well-developed.  HENT:     Head: Normocephalic.     Nose: Nose normal.  Eyes:     General: No scleral icterus.    Conjunctiva/sclera: Conjunctivae normal.  Neck:     Thyroid: No thyromegaly.  Cardiovascular:     Rate and Rhythm: Normal rate and regular rhythm.     Heart sounds: No murmur heard.   No friction rub. No gallop.  Pulmonary:     Breath sounds: No stridor. No wheezing or rales.  Chest:     Chest wall: No tenderness.  Abdominal:     General: There is no distension.     Tenderness: There is no abdominal tenderness. There is no rebound.  Musculoskeletal:        General: Normal range of motion.     Cervical back: Neck supple.  Lymphadenopathy:     Cervical: No cervical adenopathy.  Skin:    Findings: No erythema or rash.  Neurological:     Mental Status: He is alert and oriented to person, place, and time.     Motor: No abnormal muscle tone.     Coordination: Coordination normal.  Psychiatric:        Behavior: Behavior normal.    ED Results / Procedures / Treatments   Labs (all labs ordered are listed, but only abnormal results are displayed) Labs Reviewed  CBC WITH DIFFERENTIAL/PLATELET - Abnormal; Notable for the following components:      Result Value   Hemoglobin 12.9 (*)    All other components within normal limits  BASIC METABOLIC PANEL    EKG None  Radiology DG Shoulder Right  Result Date: 11/17/2020 CLINICAL DATA:  History of seizure with fall last night. Stiffness and pain in the right shoulder. History of prior closed reduction in 2020. EXAM: RIGHT SHOULDER - 2+ VIEW COMPARISON:  Right shoulder radiographs 08/28/2020 FINDINGS: There is no evidence of fracture or  dislocation. There is no evidence of arthropathy or other focal bone abnormality. Mild overlying soft tissue swelling. IMPRESSION: No fracture or dislocation of the right shoulder. Electronically Signed   By: 08/30/2020 M.D.   On: 11/17/2020 09:00    Procedures Procedures   Medications Ordered in ED Medications  levETIRAcetam (KEPPRA) IVPB 1000 mg/100 mL premix (0 mg Intravenous Stopped 11/17/20 01/17/21)    ED Course  I have reviewed the triage vital signs and the nursing notes.  Pertinent labs & imaging results that were available during my care of the patient were reviewed by me and considered in my medical decision  making (see chart for details).    MDM Rules/Calculators/A&P                           Patient with a history of seizures.  Patient has seizure and he has contusion to right shoulder.  Labs unremarkable.  Patient is back to baseline.  He he will increase his Keppra to take 1 pill twice a day instead of 1 pill once a day.  He is referred to [facility] and also will follow up with orthopedics for shoulder Final Clinical Impression(s) / ED Diagnoses Final diagnoses:  Seizure [facility])    Rx / DC Orders ED Discharge Orders          Ordered    naproxen (NAPROSYN) 500 MG tablet        11/17/20 1215             Bethann Berkshire, MD 11/17/20 1221

## 2020-11-17 NOTE — Discharge Instructions (Addendum)
Follow-up with Dr. Sherilyn Dacosta for your shoulder.  Increase your seizure medicine so you are taking 1 pill twice a day and follow-up with [facility] in the next couple weeks for your seizure
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
## 2021-01-01 ENCOUNTER — Emergency Department (HOSPITAL_COMMUNITY)
Admission: EM | Admit: 2021-01-01 | Discharge: 2021-01-01 | Disposition: A | Discharge status: Home / Self Care | Payer: 59 | Attending: Emergency Medicine | Admitting: Emergency Medicine

## 2021-01-01 ENCOUNTER — Other Ambulatory Visit: Payer: Self-pay

## 2021-01-01 ENCOUNTER — Encounter (HOSPITAL_COMMUNITY): Payer: Self-pay | Admitting: Oncology

## 2021-01-01 DIAGNOSIS — F17210 Nicotine dependence, cigarettes, uncomplicated: Secondary | ICD-10-CM | POA: Insufficient documentation

## 2021-01-01 DIAGNOSIS — S0990XA Unspecified injury of head, initial encounter: Secondary | ICD-10-CM | POA: Diagnosis present

## 2021-01-01 DIAGNOSIS — Z23 Encounter for immunization: Secondary | ICD-10-CM | POA: Insufficient documentation

## 2021-01-01 DIAGNOSIS — Y92410 Unspecified street and highway as the place of occurrence of the external cause: Secondary | ICD-10-CM | POA: Diagnosis not present

## 2021-01-01 DIAGNOSIS — S0181XA Laceration without foreign body of other part of head, initial encounter: Secondary | ICD-10-CM | POA: Insufficient documentation

## 2021-01-01 MED ORDER — TETANUS-DIPHTH-ACELL PERTUSSIS 5-2.5-18.5 LF-MCG/0.5 IM SUSY
0.5000 mL | PREFILLED_SYRINGE | Freq: Once | INTRAMUSCULAR | Status: AC
  Administered 2021-01-01: 21:00:00 0.5 mL via INTRAMUSCULAR
  Filled 2021-01-01: qty 0.5

## 2021-01-01 MED ORDER — LIDOCAINE-EPINEPHRINE (PF) 2 %-1:200000 IJ SOLN
10.0000 mL | Freq: Once | INTRAMUSCULAR | Status: AC
  Administered 2021-01-01: 10 mL
  Filled 2021-01-01: qty 20

## 2021-01-01 NOTE — Discharge Instructions (Addendum)
Please have your sutures removed in 5 days.  You may take over-the-counter Tylenol or ibuprofen as needed for pain.  Apply Neosporin over the wound daily to decrease risk of infection.  Happy belated birthday.
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
## 2021-01-06 ENCOUNTER — Ambulatory Visit (HOSPITAL_COMMUNITY): Payer: 59 | Admitting: Licensed Clinical Social Worker

## 2021-01-10 ENCOUNTER — Other Ambulatory Visit: Payer: Self-pay

## 2021-01-10 ENCOUNTER — Ambulatory Visit (HOSPITAL_COMMUNITY)
Admission: EM | Admit: 2021-01-10 | Discharge: 2021-01-10 | Disposition: A | Discharge status: Home / Self Care | Payer: 59 | Attending: Internal Medicine | Admitting: Internal Medicine

## 2021-01-10 ENCOUNTER — Encounter (HOSPITAL_COMMUNITY): Payer: Self-pay

## 2021-01-10 DIAGNOSIS — Z4802 Encounter for removal of sutures: Secondary | ICD-10-CM | POA: Diagnosis not present

## 2021-01-10 NOTE — ED Notes (Signed)
Pt had 4 sutures removed from chin

## 2021-01-10 NOTE — ED Triage Notes (Signed)
Patient is here to have suture remove from his chin.

## 2021-01-31 ENCOUNTER — Other Ambulatory Visit: Payer: Self-pay

## 2021-01-31 ENCOUNTER — Emergency Department (HOSPITAL_BASED_OUTPATIENT_CLINIC_OR_DEPARTMENT_OTHER)
Admission: EM | Admit: 2021-01-31 | Discharge: 2021-01-31 | Disposition: A | Discharge status: Home / Self Care | Payer: 59 | Attending: Emergency Medicine | Admitting: Emergency Medicine

## 2021-01-31 DIAGNOSIS — Y902 Blood alcohol level of 40-59 mg/100 ml: Secondary | ICD-10-CM | POA: Insufficient documentation

## 2021-01-31 DIAGNOSIS — Z79899 Other long term (current) drug therapy: Secondary | ICD-10-CM | POA: Diagnosis not present

## 2021-01-31 DIAGNOSIS — F17210 Nicotine dependence, cigarettes, uncomplicated: Secondary | ICD-10-CM | POA: Diagnosis not present

## 2021-01-31 DIAGNOSIS — R441 Visual hallucinations: Secondary | ICD-10-CM | POA: Insufficient documentation

## 2021-01-31 LAB — COMPREHENSIVE METABOLIC PANEL
ALT: 14 U/L (ref 0–44)
AST: 19 U/L (ref 15–41)
Albumin: 4.4 g/dL (ref 3.5–5.0)
Alkaline Phosphatase: 63 U/L (ref 38–126)
Anion gap: 10 (ref 5–15)
BUN: 11 mg/dL (ref 6–20)
CO2: 24 mmol/L (ref 22–32)
Calcium: 9.3 mg/dL (ref 8.9–10.3)
Chloride: 106 mmol/L (ref 98–111)
Creatinine, Ser: 0.66 mg/dL (ref 0.61–1.24)
GFR, Estimated: >60 mL/min (ref >60)
Glucose, Bld: 72 mg/dL (ref 70–99)
Potassium: 3.5 mmol/L (ref 3.5–5.1)
Sodium: 140 mmol/L (ref 135–145)
Total Bilirubin: 0.3 mg/dL (ref 0.3–1.2)
Total Protein: 7.4 g/dL (ref 6.5–8.1)

## 2021-01-31 LAB — RAPID URINE DRUG SCREEN, HOSP PERFORMED
Amphetamines: NOT DETECTED
Barbiturates: NOT DETECTED
Benzodiazepines: NOT DETECTED
Cocaine: NOT DETECTED
Opiates: NOT DETECTED
Tetrahydrocannabinol: NOT DETECTED

## 2021-01-31 LAB — CBC
HCT: 44.3 % (ref 39.0–52.0)
Hemoglobin: 14.5 g/dL (ref 13.0–17.0)
MCH: 29 pg (ref 26.0–34.0)
MCHC: 32.7 g/dL (ref 30.0–36.0)
MCV: 88.6 fL (ref 80.0–100.0)
Platelets: 322 10*3/uL (ref 150–400)
RBC: 5 MIL/uL (ref 4.22–5.81)
RDW: 13.6 % (ref 11.5–15.5)
WBC: 6.6 10*3/uL (ref 4.0–10.5)
nRBC: 0 % (ref 0.0–0.2)

## 2021-01-31 LAB — ETHANOL: Alcohol, Ethyl (B): 45 mg/dL — ABNORMAL HIGH (ref <10)

## 2021-01-31 MED ORDER — LORAZEPAM 2 MG/ML IJ SOLN
1.0000 mg | Freq: Once | INTRAMUSCULAR | Status: AC
  Administered 2021-01-31: 1 mg via INTRAVENOUS
  Filled 2021-01-31: qty 1

## 2021-01-31 NOTE — ED Provider Notes (Signed)
[facility] EMERGENCY DEPT Provider Note   CSN: [id number] Arrival date & time: 01/31/21  2132     History No chief complaint on file.   Stephen [name] is a 25 y.o. male.  Patient is a 25 year old male with a history of seizure disorder, depression and regular marijuana use who is presenting today with complaints of hallucinations.  Patient reports that he had 2 mikes hard lemonade which was a 40 ounce cans but also he smokes some marijuana and then he started seeing crazy things.  He reports that this marijuana is different than what he smoked yesterday and this is never happened to him before.  He reports for the last 2 hours there is just been crazy things in his vision that are moving around and it is really upsetting him.  He knows they are not there but he keeps seeing them.  He denies any seizure-like activity and has been taking his medications consistently.  He denies any chest pain or shortness of breath.  Denies any other symptoms at this time.  The history is provided by the patient.      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   Outbursts of anger 12/31/2015   Encounter for long-term (current) use of high-risk medication 12/23/2014   ODD (oppositional defiant disorder) 11/12/2014   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   MDD (major depressive disorder), recurrent episode, moderate (HCC) 11/12/2014   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   Seizure disorder (HCC) 08/31/2012     Past Surgical History:  Procedure Laterality Date   CIRCUMCISION     SHOULDER CLOSED REDUCTION Right 12/08/2018   Procedure: CLOSED REDUCTION SHOULDER;  Surgeon: Yolonda Kida, MD;  Location: [facility] OR;  Service: Orthopedics;  Laterality: Right;       Family History  Problem Relation Age of Onset   Diabetes gravidarum Paternal Grandmother    Heart attack Paternal Grandmother    Healthy Mother    Healthy Father     Social History   Tobacco Use   Smoking status: Every Day    Types: Cigarettes   Smokeless tobacco: Never  Vaping Use   Vaping Use: Every day   Substances: Nicotine  Substance Use Topics   Alcohol use: No   Drug use: No    Home Medications Prior to Admission medications   Medication Sig Start Date End Date Taking? Authorizing Provider  clonazePAM (KLONOPIN) 0.5 MG tablet Take 1 tablet (0.5 mg total) by mouth 2 (two) times daily as needed for up to 2 days for anxiety. 03/04/20 03/06/20  Arthor Captain, [name]  FEXOFENADINE HCL PO Take 1 tablet by mouth daily.     [provider]  lamoTRIgine (LAMICTAL) 100 MG tablet Take 1/2 tablet every night Patient taking differently: Take 50 mg by mouth at bedtime. 05/06/20   Van Clines, MD  naproxen (NAPROSYN) 500 MG tablet Take one twice a day if needed for shoulder pain 11/17/20   Bethann Berkshire, MD  traZODone (DESYREL) 50 MG tablet Take 1 tablet (  50 mg total) by mouth at bedtime. 05/06/20   Van Clines, MD    Allergies    Lactose intolerance (gi)  Review of Systems   Review of Systems  All other systems reviewed and are negative.  Physical Exam Updated Vital Signs BP (!) 148/84    Pulse 98    Temp 97.8 F (36.6 C)    Resp 16    Ht 5\' 9"  (1.753 m)    Wt 68 kg    SpO2 100%    BMI 22.15 kg/m   Physical Exam Vitals and nursing note reviewed.  Constitutional:      General: He is not in acute distress.    Appearance: He is well-developed.  HENT:     Head: Normocephalic and atraumatic.   Eyes:     Conjunctiva/sclera: Conjunctivae normal.     Pupils: Pupils are equal, round, and reactive to light.  Cardiovascular:     Rate and Rhythm: Normal rate and regular rhythm.     Heart sounds: No murmur heard. Pulmonary:     Effort: Pulmonary effort is normal. No respiratory distress.     Breath sounds: Normal breath sounds. No wheezing or rales.  Abdominal:     General: There is no distension.     Palpations: Abdomen is soft.     Tenderness: There is no abdominal tenderness. There is no guarding or rebound.  Musculoskeletal:        General: No tenderness. Normal range of motion.     Cervical back: Normal range of motion and neck supple.  Skin:    General: Skin is warm and dry.     Findings: No erythema or rash.  Neurological:     Mental Status: He is alert and oriented to person, place, and time.  Psychiatric:        Attention and Perception: He perceives visual hallucinations.        Mood and Affect: Affect is flat.        Speech: Speech normal.        Behavior: Behavior normal. Behavior is cooperative.        Cognition and Memory: Cognition normal.    ED Results / Procedures / Treatments   Labs (all labs ordered are listed, but only abnormal results are displayed) Labs Reviewed  CBC  COMPREHENSIVE METABOLIC PANEL  ETHANOL  RAPID URINE DRUG SCREEN, HOSP PERFORMED    EKG None  Radiology No results found.  Procedures Procedures   Medications Ordered in ED Medications  LORazepam (ATIVAN) injection 1 mg (has no administration in time range)    ED Course  I have reviewed the triage vital signs and the nursing notes.  Pertinent labs & imaging results that were available during my care of the patient were reviewed by me and considered in my medical decision making (see chart for details).    MDM Rules/Calculators/A&P                         Patient presenting today with visual hallucinations after drinking alcohol and smoking marijuana.  He is calm and  cooperative but is having visual hallucinations while I am in the room with him.  This is a new batch of marijuana and he is unsure if anybody else who smoked it is having similar symptoms.  This is never happened with marijuana before.  Concerned that this was laced with something else causing his symptoms.  He does not seem significantly intoxicated.  He is not tachycardic or hypertensive.  Will give Ativan to help with symptoms.  Labs are pending.  11:28 PM Labs are reassuring.  Pt calm and cooperative.  EtOH only 45 and pt appears stable for d/c.  He will call a ride.  MDM   Amount and/or Complexity of Data Reviewed Clinical lab tests: ordered and reviewed Independent visualization of images, tracings, or specimens: yes       Final Clinical Impression(s) / ED Diagnoses Final diagnoses:  Visual hallucination    Rx / DC Orders ED Discharge Orders     None        Blanchie Dessert, MD 01/31/21 2330

## 2021-01-31 NOTE — Discharge Instructions (Signed)
You symptoms that you are seeing things in your vision should be gone by tomorrow by noon.  There was probably something in the marijuana that you smoked.  You should not smoke that marijuana again.

## 2021-01-31 NOTE — ED Triage Notes (Signed)
Smoked marijuana and now having hallucinations. Pt states he drank 2, 40oz cans of Mikes Hard Lemonade.This has happened one other time in the past. Pt states he eyes are starting to see crazy things but he is unable to elaborate.

## 2021-02-04 ENCOUNTER — Ambulatory Visit: Payer: 59 | Admitting: Neurology

## 2021-02-04 DIAGNOSIS — Z029 Encounter for administrative examinations, unspecified: Secondary | ICD-10-CM

## 2021-02-05 ENCOUNTER — Encounter: Payer: Self-pay | Admitting: Neurology

## 2021-04-14 ENCOUNTER — Other Ambulatory Visit: Payer: Self-pay | Admitting: Neurology

## 2021-05-14 ENCOUNTER — Other Ambulatory Visit: Payer: Self-pay | Admitting: Neurology

## 2021-05-26 ENCOUNTER — Ambulatory Visit (HOSPITAL_COMMUNITY)
Admission: EM | Admit: 2021-05-26 | Discharge: 2021-05-28 | Disposition: A | Discharge status: Home / Self Care | Payer: No Payment, Other | Attending: Psychiatry | Admitting: Psychiatry

## 2021-05-26 ENCOUNTER — Encounter (HOSPITAL_COMMUNITY): Payer: Self-pay

## 2021-05-26 DIAGNOSIS — Z20822 Contact with and (suspected) exposure to covid-19: Secondary | ICD-10-CM | POA: Insufficient documentation

## 2021-05-26 DIAGNOSIS — R45850 Homicidal ideations: Secondary | ICD-10-CM | POA: Insufficient documentation

## 2021-05-26 DIAGNOSIS — F332 Major depressive disorder, recurrent severe without psychotic features: Secondary | ICD-10-CM | POA: Diagnosis not present

## 2021-05-26 DIAGNOSIS — R45851 Suicidal ideations: Secondary | ICD-10-CM | POA: Diagnosis not present

## 2021-05-26 DIAGNOSIS — R4189 Other symptoms and signs involving cognitive functions and awareness: Secondary | ICD-10-CM

## 2021-05-26 DIAGNOSIS — F22 Delusional disorders: Secondary | ICD-10-CM | POA: Insufficient documentation

## 2021-05-26 LAB — CBC WITH DIFFERENTIAL/PLATELET
Abs Immature Granulocytes: 0 10*3/uL (ref 0.00–0.07)
Basophils Absolute: 0 10*3/uL (ref 0.0–0.1)
Basophils Relative: 1 %
Eosinophils Absolute: 0.2 10*3/uL (ref 0.0–0.5)
Eosinophils Relative: 4 %
HCT: 44.1 % (ref 39.0–52.0)
Hemoglobin: 14.8 g/dL (ref 13.0–17.0)
Immature Granulocytes: 0 %
Lymphocytes Relative: 46 %
Lymphs Abs: 1.9 10*3/uL (ref 0.7–4.0)
MCH: 30 pg (ref 26.0–34.0)
MCHC: 33.6 g/dL (ref 30.0–36.0)
MCV: 89.5 fL (ref 80.0–100.0)
Monocytes Absolute: 0.4 10*3/uL (ref 0.1–1.0)
Monocytes Relative: 9 %
Neutro Abs: 1.7 10*3/uL (ref 1.7–7.7)
Neutrophils Relative %: 40 %
Platelets: 317 10*3/uL (ref 150–400)
RBC: 4.93 MIL/uL (ref 4.22–5.81)
RDW: 12.7 % (ref 11.5–15.5)
WBC: 4.1 10*3/uL (ref 4.0–10.5)
nRBC: 0 % (ref 0.0–0.2)

## 2021-05-26 LAB — URINALYSIS, COMPLETE (UACMP) WITH MICROSCOPIC
Bacteria, UA: NONE SEEN
Bilirubin Urine: NEGATIVE
Glucose, UA: NEGATIVE mg/dL
Hgb urine dipstick: NEGATIVE
Ketones, ur: NEGATIVE mg/dL
Leukocytes,Ua: NEGATIVE
Nitrite: NEGATIVE
Protein, ur: NEGATIVE mg/dL
Specific Gravity, Urine: 1.002 — ABNORMAL LOW (ref 1.005–1.030)
pH: 7 (ref 5.0–8.0)

## 2021-05-26 LAB — HEMOGLOBIN A1C
Hgb A1c MFr Bld: 5.4 % (ref 4.8–5.6)
Mean Plasma Glucose: 108.28 mg/dL

## 2021-05-26 LAB — COMPREHENSIVE METABOLIC PANEL
ALT: 18 U/L (ref 0–44)
AST: 24 U/L (ref 15–41)
Albumin: 4.7 g/dL (ref 3.5–5.0)
Alkaline Phosphatase: 85 U/L (ref 38–126)
Anion gap: 9 (ref 5–15)
BUN: 7 mg/dL (ref 6–20)
CO2: 29 mmol/L (ref 22–32)
Calcium: 9.5 mg/dL (ref 8.9–10.3)
Chloride: 101 mmol/L (ref 98–111)
Creatinine, Ser: 0.79 mg/dL (ref 0.61–1.24)
GFR, Estimated: >60 mL/min (ref >60)
Glucose, Bld: 93 mg/dL (ref 70–99)
Potassium: 4.2 mmol/L (ref 3.5–5.1)
Sodium: 139 mmol/L (ref 135–145)
Total Bilirubin: 0.8 mg/dL (ref 0.3–1.2)
Total Protein: 8.1 g/dL (ref 6.5–8.1)

## 2021-05-26 LAB — MAGNESIUM: Magnesium: 2 mg/dL (ref 1.7–2.4)

## 2021-05-26 LAB — POCT URINE DRUG SCREEN - MANUAL ENTRY (I-SCREEN)
POC Amphetamine UR: NOT DETECTED
POC Buprenorphine (BUP): NOT DETECTED
POC Cocaine UR: NOT DETECTED
POC Marijuana UR: NOT DETECTED
POC Methadone UR: NOT DETECTED
POC Methamphetamine UR: NOT DETECTED
POC Morphine: NOT DETECTED
POC Oxazepam (BZO): NOT DETECTED
POC Oxycodone UR: NOT DETECTED
POC Secobarbital (BAR): NOT DETECTED

## 2021-05-26 LAB — RESP PANEL BY RT-PCR (FLU A&B, COVID) ARPGX2
Influenza A by PCR: NEGATIVE
Influenza B by PCR: NEGATIVE
SARS Coronavirus 2 by RT PCR: NEGATIVE

## 2021-05-26 LAB — POC SARS CORONAVIRUS 2 AG -  ED: SARS Coronavirus 2 Ag: NEGATIVE

## 2021-05-26 LAB — LIPID PANEL
Cholesterol: 152 mg/dL (ref 0–200)
HDL: 69 mg/dL (ref >40)
LDL Cholesterol: 73 mg/dL (ref 0–99)
Total CHOL/HDL Ratio: 2.2 RATIO
Triglycerides: 48 mg/dL (ref <150)
VLDL: 10 mg/dL (ref 0–40)

## 2021-05-26 LAB — TSH: TSH: 3.913 u[IU]/mL (ref 0.350–4.500)

## 2021-05-26 LAB — ETHANOL: Alcohol, Ethyl (B): <10 mg/dL (ref <10)

## 2021-05-26 MED ORDER — HYDROXYZINE HCL 25 MG PO TABS: 25.0000 mg | ORAL_TABLET | Freq: Three times a day (TID) | ORAL | Status: DC | PRN

## 2021-05-26 MED ORDER — LORAZEPAM 1 MG PO TABS: 1.0000 mg | ORAL_TABLET | Freq: Every day | ORAL | Status: DC

## 2021-05-26 MED ORDER — ACETAMINOPHEN 325 MG PO TABS: 650.0000 mg | ORAL_TABLET | Freq: Four times a day (QID) | ORAL | Status: DC | PRN

## 2021-05-26 MED ORDER — LORAZEPAM 1 MG PO TABS: 1.0000 mg | ORAL_TABLET | Freq: Two times a day (BID) | ORAL | Status: DC

## 2021-05-26 MED ORDER — TRAZODONE HCL 50 MG PO TABS
50.0000 mg | ORAL_TABLET | Freq: Every evening | ORAL | Status: DC | PRN
  Administered 2021-05-26 – 2021-05-27 (×2): 50 mg via ORAL
  Filled 2021-05-26 (×2): qty 1

## 2021-05-26 MED ORDER — LOPERAMIDE HCL 2 MG PO CAPS: 2.0000 mg | ORAL_CAPSULE | ORAL | Status: DC | PRN

## 2021-05-26 MED ORDER — HYDROXYZINE HCL 25 MG PO TABS: 25.0000 mg | ORAL_TABLET | Freq: Four times a day (QID) | ORAL | Status: DC | PRN

## 2021-05-26 MED ORDER — LAMOTRIGINE 25 MG PO TABS
50.0000 mg | ORAL_TABLET | Freq: Every day | ORAL | Status: DC
  Administered 2021-05-26 – 2021-05-27 (×2): 50 mg via ORAL
  Filled 2021-05-26 (×2): qty 2

## 2021-05-26 MED ORDER — THIAMINE HCL 100 MG/ML IJ SOLN
100.0000 mg | Freq: Once | INTRAMUSCULAR | Status: AC
  Administered 2021-05-26: 100 mg via INTRAMUSCULAR
  Filled 2021-05-26: qty 2

## 2021-05-26 MED ORDER — MAGNESIUM HYDROXIDE 400 MG/5ML PO SUSP: 30.0000 mL | Freq: Every day | ORAL | Status: DC | PRN

## 2021-05-26 MED ORDER — THIAMINE HCL 100 MG PO TABS: 100.0000 mg | ORAL_TABLET | Freq: Every day | ORAL | Status: DC

## 2021-05-26 MED ORDER — ALUM & MAG HYDROXIDE-SIMETH 200-200-20 MG/5ML PO SUSP: 30.0000 mL | ORAL | Status: DC | PRN

## 2021-05-26 MED ORDER — LORAZEPAM 1 MG PO TABS
1.0000 mg | ORAL_TABLET | Freq: Four times a day (QID) | ORAL | Status: DC
  Administered 2021-05-26: 1 mg via ORAL
  Filled 2021-05-26: qty 1

## 2021-05-26 MED ORDER — LORAZEPAM 1 MG PO TABS: 1.0000 mg | ORAL_TABLET | Freq: Three times a day (TID) | ORAL | Status: DC

## 2021-05-26 MED ORDER — ONDANSETRON 4 MG PO TBDP: 4.0000 mg | ORAL_TABLET | Freq: Four times a day (QID) | ORAL | Status: DC | PRN

## 2021-05-26 MED ORDER — LORAZEPAM 1 MG PO TABS
1.0000 mg | ORAL_TABLET | Freq: Four times a day (QID) | ORAL | Status: DC | PRN
  Administered 2021-05-27: 1 mg via ORAL
  Filled 2021-05-26: qty 1

## 2021-05-26 MED ORDER — ADULT MULTIVITAMIN W/MINERALS CH
1.0000 | ORAL_TABLET | Freq: Every day | ORAL | Status: DC
  Administered 2021-05-26: 1 via ORAL
  Filled 2021-05-26: qty 1

## 2021-05-26 NOTE — ED Notes (Signed)
Patient admitted to unit for observation.  He is oddly related with odd arm gestures.  He has poor eye contact and needed clear verbal direction.  He also has trouble with verbal expression and stuttered when speaking to male staff.  Patient had difficulty putting his socks back on after skin search and seemed to get easily distracted.  He appears somewhat internally preoccupied.  Patient was calm and cooperative.  He presently denies avh shi or plan however prior to admission he endorsed suicidal thoughts and vague homicidal ideation to harm "people."  Patient bought a firearm on line and is waiting to pick it up at the delivery center.  Will monitor while on unit and provide a safe environment.   ?

## 2021-05-26 NOTE — ED Notes (Signed)
Patient resting with no sxs of distress - will continue to monitor for safety 

## 2021-05-26 NOTE — ED Notes (Signed)
Patient arrived on unit. Patient calm and cooperative. Patient safe on unit with continued monitoring. 
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
## 2021-05-26 NOTE — Progress Notes (Signed)
Dinner given
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
## 2021-05-26 NOTE — ED Notes (Signed)
Patient anxious and fidgety,  Denies SI, HI, AVH at this time. Snack given - no sxs of distress noted. Will continue to monitor for safety ?
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
## 2021-05-27 MED ORDER — NICOTINE POLACRILEX 2 MG MT GUM
2.0000 mg | CHEWING_GUM | OROMUCOSAL | Status: DC
  Administered 2021-05-27 (×4): 2 mg via ORAL
  Filled 2021-05-27 (×4): qty 1

## 2021-05-27 MED ORDER — NICOTINE 14 MG/24HR TD PT24
14.0000 mg | MEDICATED_PATCH | Freq: Once | TRANSDERMAL | Status: DC
  Administered 2021-05-27: 14 mg via TRANSDERMAL
  Filled 2021-05-27: qty 1

## 2021-05-27 NOTE — Progress Notes (Signed)
Patient was denied by [facility] due to no appropriate beds at this time. Patient meets BH inpatient criteria per Lauree Chandler, NP. Patient has been faxed out to the following facilities:  ? ?[facility]  [address] [place] [place] [address] [phone]  ?[facility]  [address] [place] [address] [phone]  ?[facility]  [address], [place] [address] [phone]  ?[facility]  [address] [place] [address] [phone]  ?[facility]  [address] [place], [place] [address] [phone]  ?[facility]  [address] [place], [place] [address] [phone]  ?[facility]  [address] [place] [address] [phone]  ?[facility]  [address] [place] [address] [phone]  ?[facility]  [address], [place] [address] [phone]  ?[facility]  [address] [place] [address] [phone]  ? ?Damita Dunnings, MSW, LCSW-A  ?10:00 PM 05/27/2021   ?
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
## 2021-05-27 NOTE — ED Notes (Signed)
Sleeping without signs of distress or difficulty respiration are 17 and easy skin color appropriate for ethnicity. Pt had  minimal interactions with peers but makes frequent request of staff drinking 9 containers of apple and cranberry juice. Pt now encouraged to drink water. ?

## 2021-05-27 NOTE — ED Notes (Signed)
Patient received snack.  

## 2021-05-27 NOTE — ED Notes (Signed)
Patient resting with no sxs of distress noted - will continue to monitor for safety 

## 2021-05-27 NOTE — ED Notes (Signed)
Patient calm and cooperative. Patient is slow to responding with staff. Patient is appropriate engaging with staff. Patient is safe on unit with continued monitoring. ?

## 2021-05-28 ENCOUNTER — Encounter (HOSPITAL_COMMUNITY): Payer: Self-pay | Admitting: Psychiatry

## 2021-05-28 ENCOUNTER — Inpatient Hospital Stay (HOSPITAL_COMMUNITY)
Admission: AD | Admit: 2021-05-28 | Discharge: 2021-06-10 | DRG: 885 | Disposition: A | Discharge status: Other Acute Inpatient Hospital | Payer: Federal, State, Local not specified - Other | Source: Intra-hospital | Attending: Psychiatry | Admitting: Psychiatry

## 2021-05-28 ENCOUNTER — Other Ambulatory Visit: Payer: Self-pay

## 2021-05-28 DIAGNOSIS — F411 Generalized anxiety disorder: Secondary | ICD-10-CM | POA: Diagnosis present

## 2021-05-28 DIAGNOSIS — F23 Brief psychotic disorder: Secondary | ICD-10-CM | POA: Diagnosis present

## 2021-05-28 DIAGNOSIS — R4189 Other symptoms and signs involving cognitive functions and awareness: Secondary | ICD-10-CM | POA: Insufficient documentation

## 2021-05-28 DIAGNOSIS — F17210 Nicotine dependence, cigarettes, uncomplicated: Secondary | ICD-10-CM | POA: Diagnosis present

## 2021-05-28 DIAGNOSIS — Z79899 Other long term (current) drug therapy: Secondary | ICD-10-CM

## 2021-05-28 DIAGNOSIS — F209 Schizophrenia, unspecified: Secondary | ICD-10-CM | POA: Diagnosis present

## 2021-05-28 DIAGNOSIS — Z91011 Allergy to milk products: Secondary | ICD-10-CM

## 2021-05-28 DIAGNOSIS — F17200 Nicotine dependence, unspecified, uncomplicated: Secondary | ICD-10-CM | POA: Diagnosis present

## 2021-05-28 DIAGNOSIS — F331 Major depressive disorder, recurrent, moderate: Secondary | ICD-10-CM | POA: Diagnosis present

## 2021-05-28 DIAGNOSIS — R45850 Homicidal ideations: Secondary | ICD-10-CM | POA: Diagnosis present

## 2021-05-28 DIAGNOSIS — G40909 Epilepsy, unspecified, not intractable, without status epilepticus: Secondary | ICD-10-CM | POA: Diagnosis present

## 2021-05-28 DIAGNOSIS — F17290 Nicotine dependence, other tobacco product, uncomplicated: Secondary | ICD-10-CM | POA: Diagnosis present

## 2021-05-28 DIAGNOSIS — E538 Deficiency of other specified B group vitamins: Secondary | ICD-10-CM | POA: Diagnosis present

## 2021-05-28 DIAGNOSIS — F1010 Alcohol abuse, uncomplicated: Secondary | ICD-10-CM | POA: Diagnosis present

## 2021-05-28 DIAGNOSIS — F201 Disorganized schizophrenia: Secondary | ICD-10-CM | POA: Diagnosis present

## 2021-05-28 DIAGNOSIS — R45851 Suicidal ideations: Secondary | ICD-10-CM | POA: Diagnosis present

## 2021-05-28 DIAGNOSIS — Z560 Unemployment, unspecified: Secondary | ICD-10-CM

## 2021-05-28 DIAGNOSIS — F203 Undifferentiated schizophrenia: Secondary | ICD-10-CM | POA: Diagnosis not present

## 2021-05-28 DIAGNOSIS — F913 Oppositional defiant disorder: Secondary | ICD-10-CM | POA: Diagnosis present

## 2021-05-28 MED ORDER — LAMOTRIGINE 25 MG PO TABS
25.0000 mg | ORAL_TABLET | Freq: Every day | ORAL | Status: DC
  Filled 2021-05-28: qty 1

## 2021-05-28 MED ORDER — TRAZODONE HCL 50 MG PO TABS: 50.0000 mg | ORAL_TABLET | Freq: Every evening | ORAL | Status: DC | PRN

## 2021-05-28 MED ORDER — LAMOTRIGINE 25 MG PO TABS
50.0000 mg | ORAL_TABLET | Freq: Every day | ORAL | Status: DC
  Filled 2021-05-28 (×2): qty 2

## 2021-05-28 MED ORDER — ARIPIPRAZOLE 10 MG PO TABS
10.0000 mg | ORAL_TABLET | Freq: Every day | ORAL | Status: DC
Start: 2021-05-29 — End: 2021-05-29
  Administered 2021-05-29: 10 mg via ORAL
  Filled 2021-05-28 (×3): qty 1

## 2021-05-28 MED ORDER — ARIPIPRAZOLE 5 MG PO TABS
5.0000 mg | ORAL_TABLET | Freq: Every day | ORAL | Status: DC
  Administered 2021-05-28: 5 mg via ORAL
  Filled 2021-05-28 (×3): qty 1

## 2021-05-28 MED ORDER — ZIPRASIDONE MESYLATE 20 MG IM SOLR: 20.0000 mg | INTRAMUSCULAR | Status: DC | PRN

## 2021-05-28 MED ORDER — WHITE PETROLATUM EX OINT
TOPICAL_OINTMENT | CUTANEOUS | Status: AC
  Filled 2021-05-28: qty 5

## 2021-05-28 MED ORDER — HYDROXYZINE HCL 25 MG PO TABS
25.0000 mg | ORAL_TABLET | Freq: Three times a day (TID) | ORAL | Status: DC | PRN
  Administered 2021-05-30 – 2021-06-09 (×14): 25 mg via ORAL
  Filled 2021-05-28 (×4): qty 1
  Filled 2021-05-28: qty 20
  Filled 2021-05-28 (×10): qty 1

## 2021-05-28 MED ORDER — OLANZAPINE 5 MG PO TBDP
5.0000 mg | ORAL_TABLET | Freq: Three times a day (TID) | ORAL | Status: DC | PRN
  Administered 2021-05-31 – 2021-06-07 (×4): 5 mg via ORAL
  Filled 2021-05-28 (×6): qty 1

## 2021-05-28 MED ORDER — LEVETIRACETAM 500 MG PO TABS
500.0000 mg | ORAL_TABLET | Freq: Every day | ORAL | Status: DC
  Filled 2021-05-28 (×2): qty 1

## 2021-05-28 MED ORDER — LEVETIRACETAM 250 MG PO TABS
250.0000 mg | ORAL_TABLET | Freq: Every day | ORAL | Status: DC
  Administered 2021-05-28: 250 mg via ORAL
  Filled 2021-05-28 (×3): qty 1

## 2021-05-28 MED ORDER — MELATONIN 5 MG PO TABS
5.0000 mg | ORAL_TABLET | Freq: Every day | ORAL | Status: DC
  Administered 2021-05-28 – 2021-06-09 (×13): 5 mg via ORAL
  Filled 2021-05-28 (×5): qty 1
  Filled 2021-05-28: qty 14
  Filled 2021-05-28 (×9): qty 1

## 2021-05-28 MED ORDER — NICOTINE POLACRILEX 2 MG MT GUM
2.0000 mg | CHEWING_GUM | OROMUCOSAL | Status: DC
  Administered 2021-05-28 (×2): 2 mg via ORAL
  Filled 2021-05-28 (×17): qty 1

## 2021-05-28 MED ORDER — LEVETIRACETAM 500 MG PO TABS
500.0000 mg | ORAL_TABLET | Freq: Two times a day (BID) | ORAL | Status: DC
  Administered 2021-05-28 – 2021-05-31 (×6): 500 mg via ORAL
  Filled 2021-05-28 (×8): qty 1

## 2021-05-28 MED ORDER — ALUM & MAG HYDROXIDE-SIMETH 200-200-20 MG/5ML PO SUSP
30.0000 mL | ORAL | Status: DC | PRN
Start: 2021-05-28 — End: 2021-06-10

## 2021-05-28 MED ORDER — LORAZEPAM 2 MG/ML IJ SOLN
2.0000 mg | Freq: Two times a day (BID) | INTRAMUSCULAR | Status: DC | PRN
  Administered 2021-06-02 – 2021-06-04 (×2): 2 mg via INTRAMUSCULAR
  Filled 2021-05-28 (×2): qty 1

## 2021-05-28 MED ORDER — MAGNESIUM HYDROXIDE 400 MG/5ML PO SUSP: 30.0000 mL | Freq: Every day | ORAL | Status: DC | PRN

## 2021-05-28 MED ORDER — LORAZEPAM 1 MG PO TABS
1.0000 mg | ORAL_TABLET | ORAL | Status: AC | PRN
Start: 2021-05-28 — End: 2021-05-31
  Administered 2021-05-31: 1 mg via ORAL
  Filled 2021-05-28: qty 1

## 2021-05-28 MED ORDER — LAMOTRIGINE 25 MG PO TABS
50.0000 mg | ORAL_TABLET | Freq: Every day | ORAL | Status: DC
Start: 2021-05-28 — End: 2021-06-10
  Administered 2021-05-28 – 2021-06-09 (×13): 50 mg via ORAL
  Filled 2021-05-28: qty 28
  Filled 2021-05-28 (×14): qty 2

## 2021-05-28 MED ORDER — CITALOPRAM HYDROBROMIDE 10 MG PO TABS
10.0000 mg | ORAL_TABLET | Freq: Every day | ORAL | Status: DC
  Administered 2021-05-28 – 2021-06-02 (×6): 10 mg via ORAL
  Filled 2021-05-28 (×8): qty 1

## 2021-05-28 MED ORDER — LORAZEPAM 1 MG PO TABS: 1.0000 mg | ORAL_TABLET | Freq: Four times a day (QID) | ORAL | Status: AC | PRN

## 2021-05-28 MED ORDER — ACETAMINOPHEN 325 MG PO TABS
650.0000 mg | ORAL_TABLET | Freq: Four times a day (QID) | ORAL | Status: DC | PRN
Start: 2021-05-28 — End: 2021-06-10
  Administered 2021-05-29 – 2021-06-06 (×2): 650 mg via ORAL
  Filled 2021-05-28 (×2): qty 2

## 2021-05-28 NOTE — Tx Team (Signed)
Initial Treatment Plan ?05/28/2021 ?5:25 AM ?Stephen [name] ?[id number] ? ? ? ?PATIENT STRESSORS: ?Medication change or noncompliance   ? ? ?PATIENT STRENGTHS: ?Communication skills  ?Supportive family/friends  ? ? ?PATIENT IDENTIFIED PROBLEMS: ?"I have been having thoughts to hurt a lot of people"  ?" I feel like cutting myself with a knife sometimes"  ?" I drink 3 cans of malt liquor overnight"  ?  ?  ?  ?  ?  ?  ?  ? ?DISCHARGE CRITERIA:  ?Ability to meet basic life and health needs ?Improved stabilization in mood, thinking, and/or behavior ?Motivation to continue treatment in a less acute level of care ?Need for constant or close observation no longer present ?Reduction of life-threatening or endangering symptoms to within safe limits ? ?PRELIMINARY DISCHARGE PLAN: ?Outpatient therapy ?Return to previous living arrangement ?Return to previous work or school arrangements ? ?PATIENT/FAMILY INVOLVEMENT: ?This treatment plan has been presented to and reviewed with the patient, Stephen [name].  The patient and family have been given the opportunity to ask questions and make suggestions. ? ?Margarita [name], RN ?05/28/2021, 5:25 AM ?

## 2021-05-28 NOTE — Discharge Instructions (Addendum)
Transfer to [facility] 

## 2021-05-28 NOTE — Progress Notes (Signed)
ADMISSION DAR NOTE:  ? ?Patient presents under IVC status from [facility]. Alert and oriented x 2 and reoriented to time. Appears to be responding to internal stimuli but denies it. Patient is slow in responding to questions. [place]son is very minimal and guarded. Stated " I bought a  gun online just to go to the [employer] and test the gun I have never held a gun before, I have not gone to pick the gun yet" Patient stated he has felt like cutting his arm with a knife last week and felt like killing  a lot of people"   ? ?Patient smokes half a pack of cigarette and vapors nicotine all day. Lives with his Brother and Father but his Mother is his greatest support. He reports drinking 2-3 cans of [employer] every night "to help me sleep". He also reports history of Seizures.  ? ?Emotional support and availability offered to Patient as needed. Skin assessment done and belongings searched per protocol. Items deemed contraband secured in locker. Unit orientation and routine discussed, Care Plan reviewed as well and Patient verbalized understanding. Fluids and Food offered, tolerated well. Q15 minutes safety checks initiated without self harm gestures.   ?
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
## 2021-05-28 NOTE — H&P (Signed)
Psychiatric Admission Assessment Adult ? ?Patient Identification: Stephen [name] ? ?MRN:  [id number] ? ?Date of Evaluation:  05/28/2021 ? ?Chief Complaint:  Disorganized thought process [R41.89] ? ?Principal Diagnosis: Disorganized thought process ? ?Diagnosis:  Principal Problem: ?  Disorganized thought process ? ?History of Present Illness: This is the second psychiatric admission for this 26 year old AA male with prior hx of depression, generalized anxiety disorder & oppositional disorder. He does have hx of seizure disorder as well. He was brought to the [facility] from the [facility] with complain of suicidal ideations with plans to buy a gun from online to kill himself. Chart review indicated that indicated that he was [place] having homicidal ideations towards his family (parents & siblings) because of an argument. Stephen [name] was a patient in this [facility] on the adolescent's unit in 2016. His diagnosis then was oppositional defiant disorder. After the evaluation at the [facility], he was transferred to the [facility] for further psychiatric evaluation/treatments. The care everywhere reports indicated patient has been on Citalopram & Lamictal in the remote past. His currently under IVC petition. During this evaluation, Stephen [name] reports,  ? ?"I forgot the first place I went to prior to coming here. The police took me there. I called the police for some selfish reasons, a lot of addictions (cigarettes & alcohol). I have been drinking heavily & smoking cigarettes for a long time. I drink 3 bottles of beer every night for over a year. I smoke about a pack of cigarettes daily. I have depression after I get drunk. I take medicines for my seizures, but I don't know what the medicines are called. I drank last night, same amount I usually drink". ? ?During this assessment, Stephen [name] was sitting up on his bed. He has his eye glasses. He is making eye contact & verbally responsive. He is displaying what seems like memory issues. He remembers bits & pieces of his  basic mental health hx. He presents as a poor historian. He knows that he does have hx of depression, anxiety & seizure disorder. He does not remember the medicines that he was taking previously or currently taking. He presents disorganized & tangential. Have tried to reach out to his sister listed as the contact person via the phone, but she did not answer the phone. Chart review has shown that he was on Keppra for his seizures. Patient reports that the last time he had seizure activity was 2 months ago.Stephen [name] currently denies any SIHI, AVH, delusional thoughts or paranoia. Although seems aloof, disconnected & unable to remember events, he does not appear to be responding to any internal stimuli. This is case & treatment plan discussed/staffed with Dr. Mamie Levers.  ? ?Associated Signs/Symptoms: ? ?Depression Symptoms:  depressed mood, ?impaired memory, ?anxiety, ? ?Duration of Depression Symptoms: Greater than two weeks ? ?(Hypo) Manic Symptoms:  Labiality of Mood, ?Anxiety Symptoms:  Excessive Worry, ?Psychotic Symptoms:  Delusions, ? ?PTSD Symptoms: ?NA ? ?Total Time spent with patient: 1 hour ? ?Past Psychiatric History: Major depression, Oppositional defiant disorder, GAD. ? ?Is the patient at risk to self? No.  ?Has the patient been a risk to self in the past 6 months? Yes.    ?Has the patient been a risk to self within the distant past? Yes.    ?Is the patient a risk to others? No.  ?Has the patient been a risk to others in the past 6 months? Yes.    ?Has the patient been a risk to others within the distant past? No.  ? ?  Prior Inpatient Therapy: Yes, [facility] [id number]. ?Prior Outpatient Therapy: No. ? ?Alcohol Screening: Patient refused Alcohol Screening Tool:  (no) ?1. How often do you have a drink containing alcohol?: 4 or more times a week ?2. How many drinks containing alcohol do you have on a typical day when you are drinking?: 3 or 4 ?3. How often do you have six or more drinks on one occasion?: Less than  monthly ?AUDIT-C Score: 6 ?4. How often during the last year have you found that you were not able to stop drinking once you had started?: Less than monthly ?5. How often during the last year have you failed to do what was normally expected from you because of drinking?: Never ?6. How often during the last year have you needed a first drink in the morning to get yourself going after a heavy drinking session?: Never ?7. How often during the last year have you had a feeling of guilt of remorse after drinking?: Never ?8. How often during the last year have you been unable to remember what happened the night before because you had been drinking?: Never ?9. Have you or someone else been injured as a result of your drinking?: No ?10. Has a relative or friend or a doctor or another health worker been concerned about your drinking or suggested you cut down?: No ?Alcohol Use Disorder Identification Test Final Score (AUDIT): 7 ?Alcohol Brief Interventions/Follow-up: Alcohol education/Brief advice ? ?Substance Abuse History in the last 12 months:  Yes.  (Patient admits frequent alcohol use). ? ?Consequences of Substance Abuse: Discussed with patient during this admission evaluation. ?Medical Consequences:  Liver damage, Possible death by overdose ?Legal Consequences:  Arrests, jail time, Loss of driving privilege. ?Family Consequences:  Family discord, divorce and or separation.  ? ?Previous Psychotropic Medications: Yes  ? ?Psychological Evaluations: No  ? ?Past Medical History:  ?Past Medical History:  ?Diagnosis Date  ? 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 Depression   ? Seizures ([facility])   ?  ?Past Surgical History:  ?Procedure Laterality Date  ? CIRCUMCISION    ? SHOULDER CLOSED REDUCTION Right 12/08/2018  ? Procedure: CLOSED REDUCTION SHOULDER;  Surgeon: Yolonda Kida, MD;  Location: [facility] OR;  Service: Orthopedics;  Laterality: Right;  ? ?Family History:  ?Family History  ?Problem Relation Age of  Onset  ? Diabetes gravidarum Paternal Grandmother   ? Heart attack Paternal Grandmother   ? Healthy Mother   ? Healthy Father   ? ?Family Psychiatric  History: Denies any familial hx of mental illnesses. ? ?Tobacco Screening:   ? ?Social History: Single, has no children, lives in [place], [place] with father, smokes a pack of cigarettes daily. ?Social History  ? ?Substance and Sexual Activity  ?Alcohol Use Yes  ? Alcohol/week: 3.0 standard drinks  ? Types: 3 Cans of beer per week  ? Comment: every night  ?   ?Social History  ? ?Substance and Sexual Activity  ?Drug Use No  ?  ?Additional Social History: ? ?Allergies:   ?Allergies  ?Allergen Reactions  ? Lactose Intolerance (Gi) Diarrhea  ? ?Lab Results:  ?Results for orders placed or performed during the hospital encounter of 05/26/21 (from the past 48 hour(s))  ?POCT Urine Drug Screen - ([facility])     Status: Normal  ? Collection Time: 05/26/21  3:57 PM  ?Result Value Ref Range  ? POC Amphetamine UR None Detected NONE DETECTED (Cut Off Level 1000 ng/mL)  ? POC Secobarbital (BAR) None Detected  NONE DETECTED (Cut Off Level 300 ng/mL)  ? POC Buprenorphine (BUP) None Detected NONE DETECTED (Cut Off Level 10 ng/mL)  ? POC Oxazepam (BZO) None Detected NONE DETECTED (Cut Off Level 300 ng/mL)  ? POC Cocaine UR None Detected NONE DETECTED (Cut Off Level 300 ng/mL)  ? POC Methamphetamine UR None Detected NONE DETECTED (Cut Off Level 1000 ng/mL)  ? POC Morphine None Detected NONE DETECTED (Cut Off Level 300 ng/mL)  ? POC Oxycodone UR None Detected NONE DETECTED (Cut Off Level 100 ng/mL)  ? POC Methadone UR None Detected NONE DETECTED (Cut Off Level 300 ng/mL)  ? POC Marijuana UR None Detected NONE DETECTED (Cut Off Level 50 ng/mL)  ?POC SARS Coronavirus 2 Ag-ED - Nasal Swab     Status: None  ? Collection Time: 05/26/21  3:57 PM  ?Result Value Ref Range  ? SARS Coronavirus 2 Ag Negative Negative  ?Resp Panel by RT-PCR (Flu A&B, Covid) Urine, Clean Catch     Status: None  ? Collection  Time: 05/26/21  4:00 PM  ? Specimen: Urine, Clean Catch; Nasopharyngeal(NP) swabs in vial transport medium  ?Result Value Ref Range  ? SARS Coronavirus 2 by RT PCR NEGATIVE NEGATIVE  ?  Comment: (NOTE) ?SAR

## 2021-05-28 NOTE — BHH Suicide Risk Assessment (Signed)
[facility] Admission Suicide Risk Assessment ? ? ?Nursing information obtained from:  Patient ?Demographic factors:  Male, Adolescent or young adult, Unemployed ?Current Mental Status:  Thoughts of violence towards others ?Loss Factors:  NA ?Historical Factors:  Impulsivity ?Risk Reduction Factors:  Living with another person, especially a relative ? ?Total Time spent with patient: 30 minutes ?Principal Problem: Brief psychotic disorder (HCC) ?Diagnosis:  Principal Problem: ?  Brief psychotic disorder (HCC) ?Active Problems: ?  Seizure disorder (HCC) ?  MDD (major depressive disorder), recurrent episode, moderate (HCC) ?  Tobacco use disorder ? ?Subjective Data:  ?This is the second psychiatric admission for this 26 year old AA male with prior hx of depression, generalized anxiety disorder & oppositional disorder. He does have hx of seizure disorder as well. He was brought to the [facility] from the [facility] with complain of suicidal ideations with plans to buy a gun from online to kill himself. Chart review indicated that indicated that he was [name] having homicidal ideations towards his family (parents & siblings) because of an argument.  ?Patient has disorganized speech and thoughts.  He is not a reliable historian and frequently contradicts simple facts within the same sentence. ?He reports some symptoms of depression.  Denies feeling anxious.  Patient reports he took his scheduled outpatient medications most recently yesterday.  He is unsure what medications he takes.  Patient reports his last suicidal thoughts were 1 month ago. he reports reports his last homicidal thoughts were 1 month ago.  He denies owning a gun.  He denies ever having owned a gun.  He denies possessing a gun. ?To me he denies AH, VH and paranoia.  He reports having symptoms of thought control.  Denies having symptoms of ideas of reference.  He does appear to be responding to internal stimuli during the interview. ? ?Continued Clinical Symptoms:  ?Alcohol Use  Disorder Identification Test Final Score (AUDIT): 7 ?The "Alcohol Use Disorders Identification Test", Guidelines for Use in Primary Care, Second Edition.  World Science writer [facility]). ?Score between 0-7:  no or low risk or alcohol related problems. ?Score between 8-15:  moderate risk of alcohol related problems. ?Score between 16-19:  high risk of alcohol related problems. ?Score 20 or above:  warrants further diagnostic evaluation for alcohol dependence and treatment. ? ? ?CLINICAL FACTORS:  ? Depression:   Anhedonia ?Currently Psychotic ?Previous Psychiatric Diagnoses and Treatments ? ? ?Musculoskeletal: ?Strength & Muscle Tone: within normal limits ? ?Gait & Station: Laying in bed  ? ?Patient leans: Laying in bed  ? ? ?Psychiatric Specialty Exam: ? ?Presentation  ?General Appearance: Disheveled ? ?Eye Contact:Poor ? ?Speech:-- (disorganized) ? ?Speech Volume:Normal ? ?Handedness:Right ? ? ?Mood and Affect  ?Mood:Anxious ? ?Affect:Constricted ? ? ?Thought Process  ?Thought Processes:Disorganized ? ?Descriptions of Associations:Tangential ? ?Orientation:Full (Time, Place and Person) ? ?Thought Content:Tangential ? ?History of Schizophrenia/Schizoaffective disorder:No ? ?Duration of Psychotic Symptoms:Less than six months ? ?Hallucinations:Hallucinations: -- (pt denies today but is reported PTA that he did have AH) ?Description of Auditory Hallucinations: NA ?Description of Visual Hallucinations: NA ? ?Ideas of Reference:None ? ?Suicidal Thoughts:Suicidal Thoughts: No ? ?Homicidal Thoughts:Homicidal Thoughts: No ?HI Active Intent and/or Plan: Without Intent; Without Plan; Without Access to Means; Without Means to Carry Out ? ? ?Sensorium  ?Memory:Immediate Poor; Recent Poor; Remote Poor ? ?Judgment:Poor ? ?Insight:Poor ? ? ?Executive Functions  ?Concentration:Poor ? ?Attention Span:Poor ? ?Recall:Poor ? ?Fund of Knowledge:Fair ? ?Language:Fair ? ? ?Psychomotor Activity  ?Psychomotor Activity:Psychomotor  Activity: Normal ? ? ?Assets  ?  Assets:Desire for Improvement; Housing; Resilience; Social Support ? ? ?Sleep  ?Sleep:Sleep: Poor ?Number of Hours of Sleep: 7 ? ? ? ?Physical Exam: ?Physical Exam See H&P ? ?ROS See H&P ? ?Blood pressure 122/72, pulse 88, temperature 97.7 ?F (36.5 ?C), temperature source Oral, resp. rate 16, height 5\' 10"  (1.778 m), weight 64.9 kg, SpO2 100 %. Body mass index is 20.52 kg/m?. ? ? ?COGNITIVE FEATURES THAT CONTRIBUTE TO RISK:  ?Polarized thinking   ?Disorganized thinking ? ?SUICIDE RISK:  ? Moderate:  Frequent suicidal ideation with limited intensity, and duration, some specificity in terms of plans, no associated intent, good self-control, limited dysphoria/symptomatology, some risk factors present, and identifiable protective factors, including available and accessible social support. ? ?PLAN OF CARE:  ? ?See H&P and my attestation for assessment, diagnosis list, and plan.  ? ?I certify that inpatient services furnished can reasonably be expected to improve the patient's condition.  ? ? , MD ?05/28/2021, 3:30 PM ? ?
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
## 2021-05-28 NOTE — Plan of Care (Signed)
  Problem: Activity: Goal: Interest or engagement in activities will improve Outcome: Progressing   Problem: Coping: Goal: Ability to verbalize frustrations and anger appropriately will improve Outcome: Progressing   Problem: Coping: Goal: Ability to demonstrate self-control will improve Outcome: Progressing   Problem: Safety: Goal: Periods of time without injury will increase Outcome: Progressing   

## 2021-05-28 NOTE — Progress Notes (Signed)
?   05/28/21 2045  ?Psych Admission Type (Psych Patients Only)  ?Admission Status Involuntary  ?Psychosocial Assessment  ?Patient Complaints Anxiety;Suspiciousness  ?Eye Contact Fair  ?Facial Expression Anxious  ?Affect Anxious  ?Speech Logical/coherent  ?Interaction Guarded  ?Motor Activity Slow  ?Appearance/Hygiene Disheveled;Body odor  ?Behavior Characteristics Cooperative  ?Mood Pleasant;Suspicious  ?Aggressive Behavior  ?Effect No apparent injury  ?Thought Process  ?Coherency Concrete thinking  ?Content WDL  ?Delusions WDL  ?Perception WDL  ?Hallucination None reported or observed  ?Judgment Poor  ?Confusion None  ?Danger to Self  ?Current suicidal ideation? Denies  ? ? ?

## 2021-05-28 NOTE — Progress Notes (Signed)
?   05/28/21 1603  ?Psych Admission Type (Psych Patients Only)  ?Admission Status Involuntary  ?Psychosocial Assessment  ?Patient Complaints Anxiety;Nervousness  ?Eye Contact Fair  ?Facial Expression Anxious  ?Affect Anxious  ?Speech Logical/coherent  ?Interaction Guarded  ?Motor Activity Other (Comment) ?(WNL)  ?Appearance/Hygiene Unremarkable  ?Behavior Characteristics Cooperative;Calm  ?Mood Pleasant  ?Thought Process  ?Coherency Concrete thinking  ?Content WDL  ?Delusions None reported or observed  ?Perception WDL  ?Hallucination None reported or observed  ?Judgment Poor  ?Confusion None  ?Danger to Self  ?Current suicidal ideation? Denies  ?Danger to Others  ?Danger to Others None reported or observed  ? ? ?

## 2021-05-28 NOTE — ED Notes (Signed)
Attempted to call report

## 2021-05-28 NOTE — Group Note (Signed)
Recreation Therapy Group Note ? ? ?Group Topic:Coping Skills  ?Group Date: 05/28/2021 ?Start Time: [phone] ?End Time: 1015 ?Facilitators: Caroll Rancher, LRT,CTRS ?Location: [address] Day[address] ? ? ?Goal Area(s) Addresses: ?Patient will define what a coping skill is. ?Patient will successfully identify positive coping skills they can use post d/c.  ?Patient will acknowledge benefit(s) of using learned coping skills post d/c. ? ?Group Description: Coping A to Z. Patient asked to identify what a coping skill is and when they use them. Patients with Clinical research associate discussed healthy versus unhealthy coping skills. Next patients were given a blank worksheet titled "Coping Skills A-Z".  Patients were instructed to come up with at least one positive coping skill per letter of the alphabet, addressing a specific challenge (ex: stress, anger, anxiety, depression, grief, doubt, isolation, self-harm/suicidal thoughts, substance use). Patients were given 15 minutes to brainstorm.  Patients and LRT debriefed on the importance of coping skill selection based on situation and back-up plans when a skill tried is not effective. At the end of group, patients were given an handout of alphabetized strategies to keep for future reference. ? ? ?Affect/Mood: N/A ?  ?Participation Level: Did not attend ?  ? ?Clinical Observations/Individualized Feedback:   ? ? ?Plan: Continue to engage patient in RT group sessions 2-3x/week. ? ? ?Caroll Rancher, LRT,CTRS ?05/28/2021 11:09 AM ?

## 2021-05-28 NOTE — ED Notes (Signed)
[facility] pt transported via [employer] to [facility] room 504-1 Kuda RN aware. ?. ?

## 2021-05-28 NOTE — Progress Notes (Signed)
Recreation Therapy Notes ? ?INPATIENT RECREATION THERAPY ASSESSMENT ? ?Patient Details ?Name: Stephen [name] ?MRN: [id number] ?DOB: Jul 27, 1995 ?Today's Date: 05/28/2021 ?      ?Information Obtained From: ?Patient ? ?Able to Participate in Assessment/Interview: ?Yes ? ?Patient Presentation: ?Alert ? ?Reason for Admission (Per Patient): ?Other (Comments) (Pt stated the police brought him.) ? ?Patient Stressors: ? (None identified) ? ?Coping Skills:   ?Music, Exercise, Substance Abuse, Talk, Prayer, Avoidance ? ?Leisure Interests (2+):  ?Individual - Other (Comment) (Smoke cigarettes, Vaping, Drink alcohol) ? ?Frequency of Recreation/Participation: ?Other (Comment) (Nightly) ? ?Awareness of Community Resources:  ?Yes ? ?Community Resources:  ?Tree surgeon, Restaurants ? ?Current Use: ?Yes ? ?If no, Barriers?: ?  ? ?Expressed Interest in [employer] Information: ?No ? ?[place] of Residence:  ?[place] ? ?Patient Main Form of Transportation: ?Therapist, music (Pt also walks.) ? ?Patient Strengths:  ?Communication ? ?Patient Identified Areas of Improvement:  ?No ? ?Patient Goal for Hospitalization:  ?"get off alcohol and cigarettes" ? ?Current SI (including self-harm):  ?No ? ?Current HI:  ?No ? ?Current AVH: ?No ? ?Staff Intervention Plan: ?Group Attendance, Collaborate with Interdisciplinary Treatment Team ? ?Consent to Intern Participation: ?N/A ? ? ?Caroll Rancher, LRT,CTRS ?Caroll Rancher A ?05/28/2021, 12:36 PM ?

## 2021-05-29 ENCOUNTER — Encounter (HOSPITAL_COMMUNITY): Payer: Self-pay

## 2021-05-29 DIAGNOSIS — R4189 Other symptoms and signs involving cognitive functions and awareness: Secondary | ICD-10-CM | POA: Diagnosis not present

## 2021-05-29 MED ORDER — ARIPIPRAZOLE 15 MG PO TABS
15.0000 mg | ORAL_TABLET | Freq: Every day | ORAL | Status: DC
Start: 2021-05-30 — End: 2021-05-30
  Administered 2021-05-30: 15 mg via ORAL
  Filled 2021-05-29 (×3): qty 1

## 2021-05-29 MED ORDER — NICOTINE 14 MG/24HR TD PT24
14.0000 mg | MEDICATED_PATCH | Freq: Every day | TRANSDERMAL | Status: DC
  Administered 2021-05-30: 14 mg via TRANSDERMAL
  Filled 2021-05-29 (×6): qty 1

## 2021-05-29 NOTE — BHH Group Notes (Signed)
Adult Psychoeducational Group Note ? ?Date:  05/29/2021 ?Time:  2:54 PM ? ?Group Topic/Focus:  ?Goals Group:   The focus of this group is to help patients establish daily goals to achieve during treatment and discuss how the patient can incorporate goal setting into their daily lives to aide in recovery. ? ?Participation Level:  Active ? ?Participation Quality:  Appropriate ? ?Affect:  Appropriate ? ?Cognitive:  Alert ? ?Insight: Appropriate ? ?Engagement in Group:  Engaged ? ?Modes of Intervention:  Orientation ? ?Additional Comments:  Pt goal for today is to work on his thoughts and feelings against his alcohol withdrawal. ? ?Dellia Nims ?05/29/2021, 2:54 PM ?

## 2021-05-29 NOTE — Progress Notes (Signed)
Pt very paranoid and suspicious on the unit this evening ?

## 2021-05-29 NOTE — Plan of Care (Signed)
?  Problem: Coping: Goal: Ability to verbalize frustrations and anger appropriately will improve Outcome: Progressing   Problem: Coping: Goal: Ability to demonstrate self-control will improve Outcome: Progressing   Problem: Safety: Goal: Periods of time without injury will increase Outcome: Progressing   Problem: Coping: Goal: Coping ability will improve Outcome: Progressing   Problem: Medication: Goal: Compliance with prescribed medication regimen will improve Outcome: Progressing   

## 2021-05-29 NOTE — Progress Notes (Signed)
?   05/29/21 0530  ?Sleep  ?Number of Hours 8.75  ? ? ?

## 2021-05-29 NOTE — Progress Notes (Signed)
?   05/29/21 1518  ?Psych Admission Type (Psych Patients Only)  ?Admission Status Involuntary  ?Psychosocial Assessment  ?Patient Complaints Anxiety  ?Eye Contact Fair  ?Facial Expression Worried  ?Affect Anxious  ?Speech Logical/coherent  ?Interaction Guarded;Minimal  ?Appearance/Hygiene Unremarkable  ?Behavior Characteristics Cooperative  ?Mood Pleasant  ?Thought Process  ?Coherency Concrete thinking  ?Content WDL  ?Delusions WDL  ?Perception WDL  ?Hallucination None reported or observed  ?Judgment Impaired  ?Confusion None  ?Danger to Self  ?Current suicidal ideation? Denies  ?Danger to Others  ?Danger to Others None reported or observed  ? ? ?

## 2021-05-29 NOTE — Progress Notes (Signed)
?   05/29/21 2015  ?Psych Admission Type (Psych Patients Only)  ?Admission Status Involuntary  ?Psychosocial Assessment  ?Patient Complaints Anxiety  ?Eye Contact Fair  ?Facial Expression Anxious  ?Affect Anxious  ?Speech Logical/coherent  ?Interaction Guarded  ?Motor Activity Slow  ?Appearance/Hygiene Disheveled;Body odor  ?Behavior Characteristics Cooperative  ?Mood Pleasant  ?Aggressive Behavior  ?Effect No apparent injury  ?Thought Process  ?Coherency Concrete thinking  ?Content WDL  ?Delusions WDL  ?Perception WDL  ?Hallucination None reported or observed  ?Judgment Poor  ?Confusion None  ?Danger to Self  ?Current suicidal ideation? Denies  ? ? ?

## 2021-05-29 NOTE — BH IP Treatment Plan (Signed)
Interdisciplinary Treatment and Diagnostic Plan Update ? ?05/29/2021 ?Time of Session: 11am ?Stephen [name] ?MRN: [id number] ? ?Principal Diagnosis: Brief psychotic disorder (HCC) ? ?Secondary Diagnoses: Principal Problem: ?  Brief psychotic disorder (HCC) ?Active Problems: ?  Seizure disorder (HCC) ?  MDD (major depressive disorder), recurrent episode, moderate (HCC) ?  Tobacco use disorder ? ? ?Current Medications:  ?Current Facility-Administered Medications  ?Medication Dose Route Frequency Provider Last Rate Last Admin  ? acetaminophen (TYLENOL) tablet 650 mg  650 mg Oral Q6H PRN Jackelyn PolingBerry, Jason A, NP      ? alum & mag hydroxide-simeth (MAALOX/MYLANTA) 200-200-20 MG/5ML suspension 30 mL  30 mL Oral Q4H PRN Jackelyn PolingBerry, Jason A, NP      ? [START ON 05/30/2021] ARIPiprazole (ABILIFY) tablet 15 mg  15 mg Oral Daily Massengill, Nathan, MD      ? citalopram (CELEXA) tablet 10 mg  10 mg Oral Daily Armandina StammerNwoko, Agnes I, NP   10 mg at 05/29/21 [id number]  ? hydrOXYzine (ATARAX) tablet 25 mg  25 mg Oral TID PRN Jackelyn PolingBerry, Jason A, NP      ? lamoTRIgine (LAMICTAL) tablet 50 mg  50 mg Oral QHS Phineas InchesMassengill, Nathan, MD   50 mg at 05/28/21 2046  ? levETIRAcetam (KEPPRA) tablet 500 mg  500 mg Oral Q12H Massengill, Harrold DonathNathan, MD   500 mg at 05/29/21 0732  ? LORazepam (ATIVAN) injection 2 mg  2 mg Intramuscular BID PRN Massengill, Harrold DonathNathan, MD      ? LORazepam (ATIVAN) tablet 1 mg  1 mg Oral Q6H PRN Nira ConnBerry, Jason A, NP      ? OLANZapine zydis (ZYPREXA) disintegrating tablet 5 mg  5 mg Oral Q8H PRN Jackelyn PolingBerry, Jason A, NP      ? And  ? LORazepam (ATIVAN) tablet 1 mg  1 mg Oral PRN Jackelyn PolingBerry, Jason A, NP      ? And  ? ziprasidone (GEODON) injection 20 mg  20 mg Intramuscular PRN Nira ConnBerry, Jason A, NP      ? magnesium hydroxide (MILK OF MAGNESIA) suspension 30 mL  30 mL Oral Daily PRN Nira ConnBerry, Jason A, NP      ? melatonin tablet 5 mg  5 mg Oral QHS Nwoko, Agnes I, NP   5 mg at 05/28/21 2046  ? nicotine (NICODERM CQ - dosed in mg/24 hours) patch 14 mg  14 mg Transdermal Daily  Massengill, Nathan, MD      ? nicotine polacrilex (NICORETTE) gum 2 mg  2 mg Oral Q4H while awake Nira ConnBerry, Jason A, NP   2 mg at 05/28/21 2046  ? ?PTA Medications: ?Medications Prior to Admission  ?Medication Sig Dispense Refill Last Dose  ? lamoTRIgine (LAMICTAL) 100 MG tablet Take 1/2 tablet every night (Patient taking differently: Take 50 mg by mouth at bedtime.) 45 tablet 3   ? traZODone (DESYREL) 50 MG tablet Take 1 tablet (50 mg total) by mouth at bedtime. 30 tablet 11   ? ? ?Patient Stressors: Medication change or noncompliance   ? ?Patient Strengths: Communication skills  ?Supportive family/friends  ? ?Treatment Modalities: Medication Management, Group therapy, Case management,  ?1 to 1 session with clinician, Psychoeducation, Recreational therapy. ? ? ?Physician Treatment Plan for Primary Diagnosis: Brief psychotic disorder (HCC) ?Long Term Goal(s): Improvement in symptoms so as ready for discharge  ? ?Short Term Goals: Ability to identify and develop effective coping behaviors will improve ?Compliance with prescribed medications will improve ?Ability to identify triggers associated with substance abuse/mental health issues will improve ?Ability to identify changes  in lifestyle to reduce recurrence of condition will improve ?Ability to verbalize feelings will improve ?Ability to disclose and discuss suicidal ideas ?Ability to demonstrate self-control will improve ? ?Medication Management: Evaluate patient's response, side effects, and tolerance of medication regimen. ? ?Therapeutic Interventions: 1 to 1 sessions, Unit Group sessions and Medication administration. ? ?Evaluation of Outcomes: Progressing ? ?Physician Treatment Plan for Secondary Diagnosis: Principal Problem: ?  Brief psychotic disorder (HCC) ?Active Problems: ?  Seizure disorder (HCC) ?  MDD (major depressive disorder), recurrent episode, moderate (HCC) ?  Tobacco use disorder ? ?Long Term Goal(s): Improvement in symptoms so as ready for  discharge  ? ?Short Term Goals: Ability to identify and develop effective coping behaviors will improve ?Compliance with prescribed medications will improve ?Ability to identify triggers associated with substance abuse/mental health issues will improve ?Ability to identify changes in lifestyle to reduce recurrence of condition will improve ?Ability to verbalize feelings will improve ?Ability to disclose and discuss suicidal ideas ?Ability to demonstrate self-control will improve    ? ?Medication Management: Evaluate patient's response, side effects, and tolerance of medication regimen. ? ?Therapeutic Interventions: 1 to 1 sessions, Unit Group sessions and Medication administration. ? ?Evaluation of Outcomes: Progressing ? ? ?RN Treatment Plan for Primary Diagnosis: Brief psychotic disorder (HCC) ?Long Term Goal(s): Knowledge of disease and therapeutic regimen to maintain health will improve ? ?Short Term Goals: Ability to remain free from injury will improve, Ability to verbalize frustration and anger appropriately will improve, Ability to demonstrate self-control, Ability to participate in decision making will improve, Ability to verbalize feelings will improve, Ability to disclose and discuss suicidal ideas, Ability to identify and develop effective coping behaviors will improve, and Compliance with prescribed medications will improve ? ?Medication Management: RN will administer medications as ordered by provider, will assess and evaluate patient's response and provide education to patient for prescribed medication. RN will report any adverse and/or side effects to prescribing provider. ? ?Therapeutic Interventions: 1 on 1 counseling sessions, Psychoeducation, Medication administration, Evaluate responses to treatment, Monitor vital signs and CBGs as ordered, Perform/monitor CIWA, COWS, AIMS and Fall Risk screenings as ordered, Perform wound care treatments as ordered. ? ?Evaluation of Outcomes:  Progressing ? ? ?LCSW Treatment Plan for Primary Diagnosis: Brief psychotic disorder (HCC) ?Long Term Goal(s): Safe transition to appropriate next level of care at discharge, Engage patient in therapeutic group addressing interpersonal concerns. ? ?Short Term Goals: Engage patient in aftercare planning with referrals and resources, Increase social support, Increase ability to appropriately verbalize feelings, Increase emotional regulation, Facilitate acceptance of mental health diagnosis and concerns, Facilitate patient progression through stages of change regarding substance use diagnoses and concerns, Identify triggers associated with mental health/substance abuse issues, and Increase skills for wellness and recovery ? ?Therapeutic Interventions: Assess for all discharge needs, 1 to 1 time with Child psychotherapist, Explore available resources and support systems, Assess for adequacy in community support network, Educate family and significant other(s) on suicide prevention, Complete Psychosocial Assessment, Interpersonal group therapy. ? ?Evaluation of Outcomes: Progressing ? ? ?Progress in Treatment: ?Attending groups: Yes. ?Participating in groups: Yes. ?Taking medication as prescribed: Yes. ?Toleration medication: Yes. ?Family/Significant other contact made: Yes, individual(s) contacted:  father, Karie Mainland ?Patient understands diagnosis: No. ?Discussing patient identified problems/goals with staff: Yes. ?Medical problems stabilized or resolved: Yes. ?Denies suicidal/homicidal ideation: Yes. ?Issues/concerns per patient self-inventory: No. ?Other: none ? ?New problem(s) identified: No, Describe:  none reported ? ?New Short Term/Long Term Goal(s):  ? ?medication stabilization, elimination of SI thoughts, development  of comprehensive mental wellness plan.  ? ? ?Patient Goals:  Patient states he would like to be clean from cigarettes and alcohol. ? ?Discharge Plan or Barriers: Patient recently admitted. CSW will continue to  follow and assess for appropriate referrals and possible discharge planning.  ? ? ?Reason for Continuation of Hospitalization: Delusions  ?Hallucinations ?Medication stabilization ?Withdrawal symptoms ?Other; describe psychosis ? ?

## 2021-05-29 NOTE — Group Note (Signed)
Recreation Therapy Group Note ? ? ?Group Topic:Self-Esteem  ?Group Date: 05/29/2021 ?Start Time: 1005 ?End Time: 1100 ?Facilitators: Caroll Rancher, LRT,CTRS ?Location: [address] Day[address] ? ? ?Goal Area(s) Addresses:  ?Patient will appropriately identify what self esteem is.  ?Patient will create a shield of armor describing themselves.  ?Patient will successfully identify positive attributes about themselves.  ?Patient will acknowledge benefit of improved self-esteem.  ? ?Group Description:  Self-Esteem Shield. Patient attended a recreation therapy group session focused on self esteem. Patient identified what self esteem is, and why it is important to have high self esteem during group discussion. LRT gave patients a print out of a blank shield with four quadrants.  Patients were asked to fill in the four quadrants reflected the following:  ?The Upper Left quadrant- 2 things or people you value ?The Upper Right quadrant- 2 lessons you have learned thus far in life ?The Lower Left quadrant- 3 qualities that make you unique ?The Lower Right quadrant- 1 goal you want to work towards ?  ?Patients were provided sheets with the shield printed on them and colored pencils, markers and crayons to complete the activity.  ?Patients and writer had group related discussions while individually working on their activity.  ?Patients were debriefed on the importance of healthy self esteem and offered a handout for ways to increase self esteem. ? ? ?Affect/Mood: Appropriate ?  ?Participation Level: Active ?  ?Participation Quality: Independent ?  ?Behavior: Attentive  ?  ?Speech/Thought Process: Relevant ?  ?Insight: Good ?  ?Judgement: Good ?  ?Modes of Intervention: Art ?  ?Patient Response to Interventions:  Attentive ?  ?Education Outcome: ? Acknowledges education and In group clarification offered   ? ?Clinical Observations/Individualized Feedback: Pt was quiet but engaged.  Pt identified his parents as the people he values  because "they help me learn new things everyday".  Two lessons patient has learned were "always listen to parents" and "be a hard worker".  Pt identified the three qualities that make him unique are being annoying, crazy and a coward.  Pt stated he was a coward because "I'm not strong and can't be depended on".  Pt identified the goal he is working towards is to be clean from any drugs by starting to chew nicotine gum.  ? ? ?Plan: Continue to engage patient in RT group sessions 2-3x/week. ? ? ?Caroll Rancher, LRT,CTRS ?05/29/2021 1:19 PM ?

## 2021-05-29 NOTE — [facility] Group Notes (Signed)
Spirituality group facilitated by Chaplain Katy Amahri Dengel, B[facility].   Group Description: Group focused on topic of hope. Patients participated in facilitated discussion around topic, connecting with one another around experiences and definitions for hope. Group members engaged with visual explorer photos, reflecting on what hope looks like for them today. Group engaged in discussion around how their definitions of hope are present today in hospital.   Modalities: Psycho-social ed, Adlerian, Narrative, MI   Patient Progress: Did not attend.  

## 2021-05-29 NOTE — Progress Notes (Signed)
[facility] MD Progress Note ? ?05/29/2021 4:04 PM ?Stephen [name]  ?MRN:  [id number] ? ?Subjective: Stephen [name] reports, "I'm good. I'm happy, mad. I'm very, I'm a person with with a lot of personalities. I'm mean sometimes. I make fun of people sometimes". ? ?Reason for admission: 26 year old AA male with prior hx of depression, generalized anxiety disorder & oppositional disorder. He does have hx of seizure disorder as well. He was brought to the [facility] from the [facility] with complain of suicidal ideations with plans to buy a gun from online to kill himself. Chart review indicated that indicated that he was [name] having homicidal ideations towards his family (parents & siblings) because of an argument. Stephen [name] was a patient in this [facility] on the adolescent's unit in 2016. His diagnosis then was oppositional defiant disorder. After the evaluation at the [facility], he was transferred to the [facility] for further psychiatric evaluation/treatments. The care everywhere reports indicated patient has been on Citalopram & Lamictal in the remote past. His currently under IVC petition. ? ?Daily notes: [name] is seen, chart reviewed. The chart findings discussed with his treatment team. He is seen in his room, sitting in the middle of his bed. He presents alert, oriented to self & place. And because of his disorganized pattern of thinking/processing information, it is hard to determine if he is actually aware of situation. [name] is verbally responsive. His speech is clear, but remains tangential. He seems to be having a hard time processing his thoughts, putting his thoughts into words for another person to understand. He is unable to express himself intelligently. [name] fumbles with his thoughts. He is however making a good eye contact. His affect is good/reactive. He is taking & tolerating his treatment regimen. He is visible on the unit, attending group sessions. He currently denies any side effects. He denies any SIHI, AVH, delusional thoughts or  paranoia. He does not appear to be responding to any internal stimuli. Reviewed vital signs (stable). Reviewed labs, there are no new lab ordered or new labs ordered. We will continue his current plan of care as already in progress.There no changes made on his current plan of care. ? ?Principal Problem: Brief psychotic disorder (HCC) ? ?Diagnosis: Principal Problem: ?  Brief psychotic disorder (HCC) ?Active Problems: ?  Seizure disorder (HCC) ?  MDD (major depressive disorder), recurrent episode, moderate (HCC) ?  Tobacco use disorder ? ?Total Time spent with patient:  35 minutes ? ?Past Psychiatric History: See H&P ? ?Past Medical History:  ?Past Medical History:  ?Diagnosis Date  ? 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 Depression   ? Seizures (HCC)   ?  ?Past Surgical History:  ?Procedure Laterality Date  ? CIRCUMCISION    ? SHOULDER CLOSED REDUCTION Right 12/08/2018  ? Procedure: CLOSED REDUCTION SHOULDER;  Surgeon: Yolonda Kida, MD;  Location: [facility] OR;  Service: Orthopedics;  Laterality: Right;  ? ?Family History:  ?Family History  ?Problem Relation Age of Onset  ? Diabetes gravidarum Paternal Grandmother   ? Heart attack Paternal Grandmother   ? Healthy Mother   ? Healthy Father   ? ?Family Psychiatric  History: See H&P ? ?Social History:  ?Social History  ? ?Substance and Sexual Activity  ?Alcohol Use Yes  ? Alcohol/week: 3.0 standard drinks  ? Types: 3 Cans of beer per week  ? Comment: every night  ?   ?Social History  ? ?Substance and Sexual Activity  ?Drug Use No  ?  ?Social History  ? ?  Socioeconomic History  ? Marital status: Single  ?  Spouse name: Not on file  ? Number of children: 0  ? Years of education: Currently in college  ? Highest education level: Not on file  ?Occupational History  ? Occupation: Consulting civil engineertudent at [employer]  ?Tobacco Use  ? Smoking status: Every Day  ?  Types: Cigarettes  ? Smokeless tobacco: Never  ?Vaping Use  ? Vaping Use: Every day  ? Substances: Nicotine   ?Substance and Sexual Activity  ? Alcohol use: Yes  ?  Alcohol/week: 3.0 standard drinks  ?  Types: 3 Cans of beer per week  ?  Comment: every night  ? Drug use: No  ? Sexual activity: Never  ?Other Topics Concern  ? Not on file  ?Social History Narrative  ? Stephen [name] is attending [employer]. 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  ? Living with his father, step-mother, and siblings.  ? Right-handed.  ? He will sometimes drink a soda.  ? ?Social Determinants of Health  ? ?Financial Resource Strain: Not on file  ?Food Insecurity: Not on file  ?Transportation Needs: Not on file  ?Physical Activity: Not on file  ?Stress: Not on file  ?Social Connections: Not on file  ? ?Additional Social History:  ? ?Sleep: Good ? ?Appetite:  Good ? ?Current Medications: ?Current Facility-Administered Medications  ?Medication Dose Route Frequency Provider Last Rate Last Admin  ? acetaminophen (TYLENOL) tablet 650 mg  650 mg Oral Q6H PRN Jackelyn PolingBerry, Jason A, NP      ? alum & mag hydroxide-simeth (MAALOX/MYLANTA) 200-200-20 MG/5ML suspension 30 mL  30 mL Oral Q4H PRN Jackelyn PolingBerry, Jason A, NP      ? [START ON 05/30/2021] ARIPiprazole (ABILIFY) tablet 15 mg  15 mg Oral Daily Massengill, Nathan, MD      ? citalopram (CELEXA) tablet 10 mg  10 mg Oral Daily Armandina StammerNwoko, Ladawn Boullion I, NP   10 mg at 05/29/21 [id number]  ? hydrOXYzine (ATARAX) tablet 25 mg  25 mg Oral TID PRN Jackelyn PolingBerry, Jason A, NP      ? lamoTRIgine (LAMICTAL) tablet 50 mg  50 mg Oral QHS Phineas InchesMassengill, Nathan, MD   50 mg at 05/28/21 2046  ? levETIRAcetam (KEPPRA) tablet 500 mg  500 mg Oral Q12H Massengill, Harrold DonathNathan, MD   500 mg at 05/29/21 0732  ? LORazepam (ATIVAN) injection 2 mg  2 mg Intramuscular BID PRN Massengill, Harrold DonathNathan, MD      ? LORazepam (ATIVAN) tablet 1 mg  1 mg Oral Q6H PRN Nira ConnBerry, Jason A, NP      ? OLANZapine zydis (ZYPREXA) disintegrating tablet 5 mg  5 mg Oral Q8H PRN Jackelyn PolingBerry, Jason A, NP      ? And  ? LORazepam (ATIVAN) tablet 1 mg  1 mg Oral PRN Jackelyn PolingBerry, Jason A, NP      ? And  ? ziprasidone (GEODON) injection 20  mg  20 mg Intramuscular PRN Nira ConnBerry, Jason A, NP      ? magnesium hydroxide (MILK OF MAGNESIA) suspension 30 mL  30 mL Oral Daily PRN Nira ConnBerry, Jason A, NP      ? melatonin tablet 5 mg  5 mg Oral QHS Cartez Mogle I, NP   5 mg at 05/28/21 2046  ? nicotine (NICODERM CQ - dosed in mg/24 hours) patch 14 mg  14 mg Transdermal Daily Massengill, Harrold DonathNathan, MD      ? ?Lab Results: No results found for this or any previous visit (from the past 48 hour(s)). ? ?Blood Alcohol level:  ?  Lab Results  ?Component Value Date  ? ETH <10 05/26/2021  ? ETH 45 (H) 01/31/2021  ? ?Metabolic Disorder Labs: ?Lab Results  ?Component Value Date  ? HGBA1C 5.4 05/26/2021  ? MPG 108.28 05/26/2021  ? ?No results found for: PROLACTIN ?Lab Results  ?Component Value Date  ? CHOL 152 05/26/2021  ? TRIG 48 05/26/2021  ? HDL 69 05/26/2021  ? CHOLHDL 2.2 05/26/2021  ? VLDL 10 05/26/2021  ? LDLCALC 73 05/26/2021  ? LDLCALC 54 11/13/2014  ? ?Physical Findings: ?AIMS:  , ,  ,  ,    ?CIWA:  CIWA-Ar Total: 0 ?COWS:    ? ?Musculoskeletal: ?Strength & Muscle Tone: within normal limits ?Gait & Station: normal ?Patient leans: N/A ? ?Psychiatric Specialty Exam: ? ?Presentation  ?General Appearance: Disheveled ? ?Eye Contact:Poor ? ?Speech:-- (disorganized) ? ?Speech Volume:Normal ? ?Handedness:Right ? ?Mood and Affect  ?Mood:Anxious ? ?Affect:Constricted ? ?Thought Process  ?Thought Processes:Disorganized ? ?Descriptions of Associations:Tangential ? ?Orientation:Full (Time, Place and Person) ? ?Thought Content:Tangential ? ?History of Schizophrenia/Schizoaffective disorder:No ? ?Duration of Psychotic Symptoms:Less than six months ? ?Hallucinations:Hallucinations: -- (pt denies today but is reported PTA that he did have AH) ?Description of Auditory Hallucinations: NA ?Description of Visual Hallucinations: NA ? ?Ideas of Reference:None ? ?Suicidal Thoughts:Suicidal Thoughts: No ? ?Homicidal Thoughts:Homicidal Thoughts: No ?HI Active Intent and/or Plan: Without Intent;  Without Plan; Without Access to Means; Without Means to Carry Out ? ?Sensorium  ?Memory:Immediate Poor; Recent Poor; Remote Poor ? ?Judgment:Poor ? ?Insight:Poor ? ?Executive Functions  ?Concentration:Poor ? ?Attention

## 2021-05-29 NOTE — Group Note (Signed)
Type of Therapy and Topic: Group Therapy: Control ? ?Participation Level: Active ? ?Description of Group: ?In this group patients will discuss what is out of their control, what is somewhat in their control, and what is within their control.  They will be encouraged to explore what issues they can control and what issues are out of their control within their daily lives. They will be guided to discuss their thoughts, feelings, and behaviors related to these issues. The group will process together ways to better control things that are well within our own control and how to notice and accept the things that are not within our control. This group will be process-oriented, with patients participating in exploration of their own experiences as well as giving and receiving support and challenge from other group members.  During this group 2 worksheets will be provided to each patient to follow along and fill out.  ? ?Therapeutic Goals: ?1. Patient will identify what is within their control and what is not within their control. ?2. Patient will identify their thoughts and feelings about having control over their own lives. ?3. Patient will identify their thoughts and feelings about not having control over everything in their lives.Marland Kitchen ?4. Patient will identify ways that they can have more control over their own lives. ?5. Patient will identify areas were they can allow others to help them or provide assistance. ? ?Summary of Patient Progress: Patient participated appropriately in group.  Patient has a goal of being clean, free from cigarettes.  Patient discussed and processed things that he can control and cannot control.  He was able to assist other patients with stressful situations they are going through and identify things that he can control versus not control.   ? ? ?Whittney Steenson, LCSW, LCAS ?Clincal Social Worker  ?[facility] ? ? ?

## 2021-05-30 ENCOUNTER — Inpatient Hospital Stay (HOSPITAL_COMMUNITY): Payer: Federal, State, Local not specified - Other

## 2021-05-30 MED ORDER — OLANZAPINE 5 MG PO TABS
5.0000 mg | ORAL_TABLET | Freq: Every day | ORAL | Status: DC
  Administered 2021-05-30 – 2021-05-31 (×2): 5 mg via ORAL
  Filled 2021-05-30 (×6): qty 1

## 2021-05-30 MED ORDER — OLANZAPINE 10 MG PO TABS
10.0000 mg | ORAL_TABLET | Freq: Every day | ORAL | Status: DC
  Administered 2021-05-30: 10 mg via ORAL
  Filled 2021-05-30 (×4): qty 1

## 2021-05-30 NOTE — Progress Notes (Signed)
?   05/30/21 0500  ?Sleep  ?Number of Hours 6.5  ? ? ?

## 2021-05-30 NOTE — Group Note (Signed)
LCSW Group Therapy Note ? ?Group Date: 05/30/2021 ?Start Time: 1130 ?End Time: 1200 ? ? ?Type of Therapy and Topic:  Group Therapy: Anger Cues and Responses ? ?Participation Level:  Active ? ? ?Description of Group:   ?In this group, patients learned how to recognize the physical, cognitive, emotional, and behavioral responses they have to anger-provoking situations.  They identified a recent time they became angry and how they reacted.  They analyzed how their reaction was possibly beneficial and how it was possibly unhelpful.  The group discussed a variety of healthier coping skills that could help with such a situation in the future.  Focus was placed on how helpful it is to recognize the underlying emotions to our anger, because working on those can lead to a more permanent solution as well as our ability to focus on the important rather than the urgent. ? ?Therapeutic Goals: ?Patients will remember their last incident of anger and how they felt emotionally and physically, what their thoughts were at the time, and how they behaved. ?Patients will identify how their behavior at that time worked for them, as well as how it worked against them. ?Patients will explore possible new behaviors to use in future anger situations. ?Patients will learn that anger itself is normal and cannot be eliminated, and that healthier reactions can assist with resolving conflict rather than worsening situations. ? ?Summary of Patient Progress:  Stephen [name] was active during the group. He shared a recent occurrence wherein not knowing who to trust led to anger. Each time he spoke, he used a lot of filler words that meant nothing and had a hard time articulating his thoughts.  He demonstrated growing and willing insight into the subject matter, was respectful of peers, and participated throughout the entire session. ? ?Therapeutic Modalities:   ?Cognitive Behavioral Therapy ? ? ? ?Lynnell Chad, LCSWA ?05/30/2021  2:54 PM   ? ?

## 2021-05-30 NOTE — Progress Notes (Addendum)
[facility] ? ?05/30/2021 5:40 PM ?Stephen PullingYassir [name]  ?MRN:  [id number] ? ?Subjective: : 26 year old AA male with prior hx of depression, generalized anxiety disorder & oppositional disorder. He does have hx of seizure disorder as well. [facility] with complain of suicidal ideations with plans to buy a gun from online to kill himself. Chart review indicated that indicated that he was [place] having homicidal ideations towards his family (parents & siblings) because of an argument. Stephen [name] [facility]. His diagnosis then was oppositional defiant disorder. [facility]. The care everywhere reports indicated patient has been on Citalopram & Lamictal in the remote past. His currently under IVC petition. ? ?Daily notes: Stephen [name] is seen, chart reviewed. The chart findings discussed with his treatment team. He is seen in 500 hall.  He presents alert, oriented to self & place.  He still disorganized and has poverty of speech.  He responds in few words or short sentences.  His speech is clear, but he stammers sometimes.  He fumbles with his thoughts sometimes.he has been tolerating his medication well without any side effects.  Currently, he denies any SI, HI, AVH, delusional thoughts or paranoia.  However he reports that he sometimes hears voices which tell him to kill people and himself.  He reports that last time he heard voices was when he was drunk.  He thinks that he is here for alcohol addiction as he drinks a lot of alcohol.  He drank a whole bottle of beer which had 8% alcohol before admission.  He reports good mood and does not feel depressed or anxious.  He talked to his mom recently.  He reports that he slept well last night but woke up few times.  He reports stable appetite and ate his breakfast with Mal[place]key sausage and [place] toast and lunch.   He denies any withdrawal symptoms including anxiety, tremors, restlessness, sweating, nausea, and diarrhea.  He denies any alcohol cravings.  He denies using any drugs but smokes cigarettes. He is sure that his cigarettes are not laced with any drugs.  He vapes 50 mg of nicotine every day.  He denies any VH today but reports that in the past when he was walking down at night he thinks that he saw a person who was not there.  He reports hearing voices even when he is not drunk.  He denies any paranoia.  He feels safe in the unit.  He reports that he usually takes a nap and sleeps for 4 hours during the daytime and about 5 hours at nighttime.  He reports that he goes to sleep at 1-2 AM at night.  He is currently unemployed.  He does not appear to be responding to any internal stimuli.  He gave consent to talk to his mom.   ?Talked to Stephen NamMom, Stephen [name] @ [phone]-  Mom states that patient does not have any mental illness but what ever is happening to him is because of his seizure medications.  She thinks that this is all side effect of seizure medications which is causing him a lot of stress.  She is not sure if patient was hearing any voices or seeing things which are not there.  Mom states patient is a good and quite child but sometimes does not sleep at night and  drive outside.  He smokes cigarettes but does not use any drugs.  She reports that he wants to quit cigarettes. Give her update about patient's current condition and plan.  ?Talked to mom ?Principal Problem: Brief psychotic disorder (HCC) ? ?Diagnosis: Principal Problem: ?  Brief psychotic disorder (HCC) ?Active Problems: ?  Seizure disorder (HCC) ?  MDD (major depressive disorder), recurrent episode, moderate (HCC) ?  Tobacco use disorder ? ?Total Time spent with patient:  35 minutes extra 10 minutes spent for getting collateral information. ?I personally spent 45 minutes on the unit in direct patient care. The direct patient care time included  face-to-face time with the patient, reviewing the patient's chart, communicating with other professionals, and coordinating care. Greater than 50% of this time was spent in counseling or coordinating care with the patient regarding goals of hospitalization, psycho-education, and discharge planning needs.  ?Past Psychiatric History: See H&P ? ?Past Medical History:  ?Past Medical History:  ?Diagnosis Date  ? 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 Depression   ? Seizures (HCC)   ?  ?Past Surgical History:  ?Procedure Laterality Date  ? CIRCUMCISION    ? SHOULDER CLOSED REDUCTION Right 12/08/2018  ? Procedure: CLOSED REDUCTION SHOULDER;  Surgeon: Yolonda Kida, MD;  Location: [facility] OR;  Service: Orthopedics;  Laterality: Right;  ? ?Family History:  ?Family History  ?Problem Relation Age of Onset  ? Diabetes gravidarum Paternal Grandmother   ? Heart attack Paternal Grandmother   ? Healthy Mother   ? Healthy Father   ? ?Family Psychiatric  History: See H&P ? ?Social History:  ?Social History  ? ?Substance and Sexual Activity  ?Alcohol Use Yes  ? Alcohol/week: 3.0 standard drinks  ? Types: 3 Cans of beer per week  ? Comment: every night  ?   ?Social History  ? ?Substance and Sexual Activity  ?Drug Use No  ?  ?Social History  ? ?Socioeconomic History  ? Marital status: Single  ?  Spouse name: Not on file  ? Number of children: 0  ? Years of education: Currently in college  ? Highest education level: Not on file  ?Occupational History  ? Occupation: Consulting civil engineer at [employer]  ?Tobacco Use  ? Smoking status: Every Day  ?  Types: Cigarettes  ? Smokeless tobacco: Never  ?Vaping Use  ? Vaping Use: Every day  ? Substances: Nicotine  ?Substance and Sexual Activity  ? Alcohol use: Yes  ?  Alcohol/week: 3.0 standard drinks  ?  Types: 3 Cans of beer per week  ?  Comment: every night  ? Drug use: No  ? Sexual activity: Never  ?Other Topics Concern  ? Not on file  ?Social History Narrative  ? Wirt is attending [employer]. He  is Glass blower/designer in Primary school teacher.  ? Living with his father, step-mother, and siblings.  ? Right-handed.  ? He will sometimes drink a soda.  ? ?Social Determinants of Health  ? ?Financial Resource Strain: Not on file  ?Food Insecurity: Not on file  ?Transportation Needs: Not on file  ?Physical Activity: Not on file  ?Stress: Not on file  ?Social Connections: Not on file  ? ?Additional Social History:  ? ?Sleep: Good ? ?Appetite:  Good ? ?Current Medications: ?Current Facility-Administered Medications  ?Medication Dose Route Frequency Provider Last Rate Last Admin  ? acetaminophen (TYLENOL) tablet 650 mg  650 mg Oral Q6H PRN Jackelyn Poling, NP   650 mg at 05/29/21 2048  ? alum & mag  hydroxide-simeth (MAALOX/MYLANTA) 200-200-20 MG/5ML suspension 30 mL  30 mL Oral Q4H PRN Nira Conn A, NP      ? citalopram (CELEXA) tablet 10 mg  10 mg Oral Daily Armandina Stammer I, NP   10 mg at 05/30/21 0848  ? hydrOXYzine (ATARAX) tablet 25 mg  25 mg Oral TID PRN Jackelyn Poling, NP      ? lamoTRIgine (LAMICTAL) tablet 50 mg  50 mg Oral QHS Phineas Inches, MD   50 mg at 05/29/21 2049  ? levETIRAcetam (KEPPRA) tablet 500 mg  500 mg Oral Q12H Massengill, Harrold Donath, MD   500 mg at 05/30/21 0848  ? LORazepam (ATIVAN) injection 2 mg  2 mg Intramuscular BID PRN Massengill, Harrold Donath, MD      ? OLANZapine zydis (ZYPREXA) disintegrating tablet 5 mg  5 mg Oral Q8H PRN Jackelyn Poling, NP      ? And  ? LORazepam (ATIVAN) tablet 1 mg  1 mg Oral PRN Jackelyn Poling, NP      ? And  ? ziprasidone (GEODON) injection 20 mg  20 mg Intramuscular PRN Nira Conn A, NP      ? magnesium hydroxide (MILK OF MAGNESIA) suspension 30 mL  30 mL Oral Daily PRN Nira Conn A, NP      ? melatonin tablet 5 mg  5 mg Oral QHS Nwoko, Agnes I, NP   5 mg at 05/29/21 2049  ? nicotine (NICODERM CQ - dosed in mg/24 hours) patch 14 mg  14 mg Transdermal Daily Massengill, Harrold Donath, MD   14 mg at 05/30/21 0849  ? OLANZapine (ZYPREXA) tablet 10 mg  10 mg Oral QHS Karsten Ro, MD       ? OLANZapine (ZYPREXA) tablet 5 mg  5 mg Oral Daily Karsten Ro, MD   5 mg at 05/30/21 1722  ? ?Lab Results: No results found for this or any previous visit (from the past 48 hour(s)). ? ?Blood Alcoho

## 2021-05-30 NOTE — Progress Notes (Signed)
Pt was encouraged but didn't attend orientation/goals group. ?

## 2021-05-30 NOTE — BHH Group Notes (Signed)
Warp up group: Continue on working on ways to handle his alcohol addiction. ?

## 2021-05-30 NOTE — Progress Notes (Signed)
?   05/29/21 2015  ?Psych Admission Type (Psych Patients Only)  ?Admission Status Involuntary  ?Psychosocial Assessment  ?Patient Complaints Anxiety  ?Eye Contact Fair  ?Facial Expression Anxious  ?Affect Anxious  ?Speech Logical/coherent  ?Interaction Guarded  ?Motor Activity Slow  ?Appearance/Hygiene Disheveled;Body odor  ?Behavior Characteristics Cooperative  ?Mood Pleasant  ?Aggressive Behavior  ?Effect No apparent injury  ?Thought Process  ?Coherency Concrete thinking  ?Content WDL  ?Delusions WDL  ?Perception WDL  ?Hallucination None reported or observed  ?Judgment Poor  ?Confusion None  ?Danger to Self  ?Current suicidal ideation? Denies  ? ? ?

## 2021-05-30 NOTE — BHH Group Notes (Signed)
Psychoeducational Group Note ? ?Date: 05/30/2021 ?Time: 0900-1000 ? ? ? ?Goal Setting  ? ?Purpose of Group: This group helps to provide patients with the steps of setting a goal that is specific, measurable, attainable, realistic and time specific. A discussion on how we keep ourselves stuck with negative self talk. Homework given for Patients to write 30 positive attributes about themselves. ? ? ? ?Participation Level:  Active ? ?Participation Quality:  Appropriate ? ?Affect:  Appropriate ? ?Cognitive:  Appropriate ? ?Insight:  Improving ? ?Engagement in Group:  Engaged ? ?Additional Comments:  Pt came into the group and participated. Rates his energy at a 3/10 ? ?Bryson Dames A ?

## 2021-05-31 DIAGNOSIS — F209 Schizophrenia, unspecified: Principal | ICD-10-CM

## 2021-05-31 DIAGNOSIS — F1010 Alcohol abuse, uncomplicated: Secondary | ICD-10-CM | POA: Diagnosis present

## 2021-05-31 LAB — SEDIMENTATION RATE: Sed Rate: 5 mm/hr (ref 0–16)

## 2021-05-31 LAB — RAPID HIV SCREEN (HIV 1/2 AB+AG)
HIV 1/2 Antibodies: NONREACTIVE
HIV-1 P24 Antigen - HIV24: NONREACTIVE

## 2021-05-31 LAB — VITAMIN B12: Vitamin B-12: 169 pg/mL — ABNORMAL LOW (ref 180–914)

## 2021-05-31 MED ORDER — RISPERIDONE 2 MG PO TABS
2.0000 mg | ORAL_TABLET | Freq: Every day | ORAL | Status: DC
  Administered 2021-06-01: 2 mg via ORAL
  Filled 2021-05-31 (×3): qty 1

## 2021-05-31 MED ORDER — RISPERIDONE 3 MG PO TABS
6.0000 mg | ORAL_TABLET | Freq: Every day | ORAL | Status: DC
  Administered 2021-05-31 – 2021-06-02 (×3): 6 mg via ORAL
  Filled 2021-05-31 (×5): qty 2

## 2021-05-31 MED ORDER — VITAMIN B-12 1000 MCG PO TABS
1000.0000 ug | ORAL_TABLET | Freq: Every day | ORAL | Status: DC
  Administered 2021-05-31 – 2021-06-10 (×11): 1000 ug via ORAL
  Filled 2021-05-31 (×2): qty 1
  Filled 2021-05-31: qty 14
  Filled 2021-05-31 (×11): qty 1

## 2021-05-31 NOTE — Progress Notes (Signed)
Patient appears anxious. Patient denies SI/HI/AVH. Pt goal for the day is to "become more of a good person". Patient complied with morning medication with no reported side effects. Patient reports 7/10 pain headache and feeling dizzy. Pt was told to sit down and rest and educated to stand up slowly. Pt reported "feeling like he was going to pass out". Pt reports anxiety 0/10 and depression being an 6/10 and hopelessness as a 7/10. Pt reports he slept "good" last night.Patient remains safe on Q1min checks and contracts for safety.   ? ? ? ? 05/31/21 1000  ?Psychosocial Assessment  ?Patient Complaints Anxiety;Hopelessness  ?Eye Contact Fair  ?Facial Expression Anxious  ?Affect Anxious  ?Speech Logical/coherent  ?Interaction Guarded;Assertive  ?Motor Activity Fidgety  ?Appearance/Hygiene Unremarkable  ?Behavior Characteristics Cooperative  ?Mood Anxious;Depressed  ?Thought Process  ?Coherency Concrete thinking  ?Content WDL  ?Delusions WDL  ?Perception WDL  ?Hallucination None reported or observed  ?Judgment Impaired  ?Confusion None  ?Danger to Self  ?Current suicidal ideation? Denies  ?Danger to Others  ?Danger to Others None reported or observed  ? ? ?

## 2021-05-31 NOTE — BHH Group Notes (Signed)
Adult Psychoeducational Group Not ?Date:  05/31/2021 ?Time:  0900-1045 ?Group Topic/Focus: PROGRESSIVE RELAXATION. A group where deep breathing is taught and tensing and relaxation muscle groups is used. Imagery is used as well.  Pts are asked to imagine 3 pillars that hold them up when they are not able to hold themselves up and to share that with the group. ? ?Participation Level:  did not attend ?Malea Swilling A ? ? ?

## 2021-05-31 NOTE — Progress Notes (Signed)
?   05/31/21 0500  ?Psych Admission Type (Psych Patients Only)  ?Admission Status Involuntary  ?Psychosocial Assessment  ?Patient Complaints Anxiety  ?Eye Contact Fair  ?Facial Expression Anxious  ?Affect Anxious  ?Speech Logical/coherent  ?Interaction Guarded  ?Motor Activity Fidgety  ?Appearance/Hygiene Unremarkable  ?Behavior Characteristics Cooperative  ?Mood Anxious  ?Thought Process  ?Coherency Concrete thinking  ?Content WDL  ?Delusions WDL  ?Perception WDL  ?Hallucination None reported or observed  ?Judgment Impaired  ?Confusion None  ?Danger to Self  ?Current suicidal ideation? Denies  ?Danger to Others  ?Danger to Others None reported or observed  ? ? ?

## 2021-05-31 NOTE — Progress Notes (Signed)
[facility] MD Progress Note ? ?05/31/2021 3:05 PM ?Stephen [name]  ?MRN:  [id number] ? ?Subjective: : 26 year old AA male with prior hx of depression, generalized anxiety disorder & oppositional disorder. He does have hx of seizure disorder as well. He was brought to the [facility] from the [facility] with complain of suicidal ideations with plans to buy a gun from online to kill himself. Chart review indicated that indicated that he was [name] having homicidal ideations towards his family (parents & siblings) because of an argument. Cougar was a patient in this [facility] on the adolescent's unit in 2016. His diagnosis then was oppositional defiant disorder. After the evaluation at the [facility], he was transferred to the [facility] for further psychiatric evaluation/treatments. The care everywhere reports indicated patient has been on Citalopram & Lamictal in the remote past. His currently under IVC petition. ? ?Daily notes: Stephen [name] is seen, chart reviewed. The chart findings discussed with his treatment team.  He presents alert, oriented to self & place.  He still disorganized and has poverty of speech.  He responds in few words or short sentences and only speaks to answer questions.  His speech is clear, but he stammers sometimes.  He fumbles with his thoughts sometimes.he has been tolerating his medication well without any side effects.  He has poor eye contact.  Currently, he denies any SI, HI, AVH.  Reports that last time he head voices and saw shadows was when he was walking on the sidewalk couple days ago before this admission.  He reports that his mood is good and denies any depression or anxiety. He denies any cigarette cravings. He slept well last night.  He reports that his mom visited him last night and brought a lot of things.  He lives with his dad but will visit his mom who  lives in an apartment.  His mom and dad are separated.  He does not know his seizure medications but he was taking that regularly. He is not sure if he was taking 1 or 2  medications for seizures. He is interested in alcohol rehab. His mom and dad do not drink.  Patient endorses paranoia and thinks that a lot of people want to hurt and kill him.  He thinks that his neighbor at dad's place hates him because he talks to himself.  He reports that they make fun of him and sometimes he feels like killing them. He reports that he hears them talking about him.  He states that they know how he feels and what he does.  He reports that he sometimes talks to people who are not there.  He wishes that hallucination goes away but sometimes he wants to hear voices as they tell him to get a job.  He is not missing hearing voices here in the hospital.  He reports that his dad works at a pharmacy.  His dad and mom both are Arabic and their first language is Arabic. He gave consent to talk to his Dad.   ? ?Talked to Dad Stephen [name]-  Dad speaks Arabic and just speaks little bit English only.  Dad is not a Teacher, early years/pre and works in some drug company.  He states that patient was not on Keppra because it made him mad and crazy so it was stopped long time ago and switched to lamotrigine.  Dad gives him medications.  Dad states that he is sure that he has some mental problems because in high school he made statement that he  is going to kill everyone.  Police investigated at that time.  Dad states he breaks a lot of things and broke TV one time.  Dad states he smokes too many cigarettes like every 10 minutes but does not use any drugs.  Dad thinks he drinks alcohol only when he has some money.  Dad gives him money.  Dad states that all his mental problems started after he started having seizures in 2014.  Dad states he talks to himself, does everything in excessive and sometimes takes 1 hour in the bathroom. ? ?Talked to mom ?Principal Problem: Schizophrenia (HCC) ? ?Diagnosis: Principal Problem: ?  Schizophrenia (HCC) ?Active Problems: ?  Seizure disorder (HCC) ?  MDD (major depressive disorder), recurrent  episode, moderate (HCC) ?  Tobacco use disorder ?  Mild alcohol use disorder ? ?Total Time spent with patient:  35 minutes extra 15 minutes spent for getting collateral information. ?I personally spent 50 minutes on the unit in direct patient care. The direct patient care time included face-to-face time with the patient, reviewing the patient's chart, communicating with other professionals, and coordinating care. Greater than 50% of this time was spent in counseling or coordinating care with the patient regarding goals of hospitalization, psycho-education, and discharge planning needs.  ?Past Psychiatric History: See H&P ? ?Past Medical History:  ?Past Medical History:  ?Diagnosis Date  ? 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 Depression   ? Seizures (HCC)   ?  ?Past Surgical History:  ?Procedure Laterality Date  ? CIRCUMCISION    ? SHOULDER CLOSED REDUCTION Right 12/08/2018  ? Procedure: CLOSED REDUCTION SHOULDER;  Surgeon: Yolonda Kidaogers, Jason Patrick, MD;  [facility];  Service: Orthopedics;  Laterality: Right;  ? ?Family History:  ?Family History  ?Problem Relation Age of Onset  ? Diabetes gravidarum Paternal Grandmother   ? Heart attack Paternal Grandmother   ? Healthy Mother   ? Healthy Father   ? ?Family Psychiatric  History: See H&P ? ?Social History:  ?Social History  ? ?Substance and Sexual Activity  ?Alcohol Use Yes  ? Alcohol/week: 3.0 standard drinks  ? Types: 3 Cans of beer per week  ? Comment: every night  ?   ?Social History  ? ?Substance and Sexual Activity  ?Drug Use No  ?  ?Social History  ? ?Socioeconomic History  ? Marital status: Single  ?  Spouse name: Not on file  ? Number of children: 0  ? Years of education: Currently in college  ? Highest education level: Not on file  ?Occupational History  ? Occupation: Consulting civil engineertudent at [employer]  ?Tobacco Use  ? Smoking status: Every Day  ?  Types: Cigarettes  ? Smokeless tobacco: Never  ?Vaping Use  ? Vaping Use: Every day  ? Substances: Nicotine   ?Substance and Sexual Activity  ? Alcohol use: Yes  ?  Alcohol/week: 3.0 standard drinks  ?  Types: 3 Cans of beer per week  ?  Comment: every night  ? Drug use: No  ? Sexual activity: Never  ?Other Topics Concern  ? Not on file  ?Social History Narrative  ? Rosanne [name] is attending [employer]. 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  ? Living with his father, step-mother, and siblings.  ? Right-handed.  ? He will sometimes drink a soda.  ? ?Social Determinants of Health  ? ?Financial Resource Strain: Not on file  ?Food Insecurity: Not on file  ?Transportation Needs: Not on file  ?Physical Activity: Not on file  ?Stress:  Not on file  ?Social Connections: Not on file  ? ?Additional Social History:  ? ?Sleep: Good ? ?Appetite:  Good ? ?Current Medications: ?Current Facility-Administered Medications  ?Medication Dose Route Frequency Provider Last Rate Last Admin  ? acetaminophen (TYLENOL) tablet 650 mg  650 mg Oral Q6H PRN Jackelyn Poling, NP   650 mg at 05/29/21 2048  ? alum & mag hydroxide-simeth (MAALOX/MYLANTA) 200-200-20 MG/5ML suspension 30 mL  30 mL Oral Q4H PRN Nira Conn A, NP      ? citalopram (CELEXA) tablet 10 mg  10 mg Oral Daily Armandina Stammer I, NP   10 mg at 05/31/21 3151  ? hydrOXYzine (ATARAX) tablet 25 mg  25 mg Oral TID PRN Jackelyn Poling, NP   25 mg at 05/30/21 2112  ? lamoTRIgine (LAMICTAL) tablet 50 mg  50 mg Oral QHS Phineas Inches, MD   50 mg at 05/30/21 2112  ? LORazepam (ATIVAN) injection 2 mg  2 mg Intramuscular BID PRN Massengill, Harrold Donath, MD      ? OLANZapine zydis (ZYPREXA) disintegrating tablet 5 mg  5 mg Oral Q8H PRN Jackelyn Poling, NP      ? And  ? LORazepam (ATIVAN) tablet 1 mg  1 mg Oral PRN Jackelyn Poling, NP      ? And  ? ziprasidone (GEODON) injection 20 mg  20 mg Intramuscular PRN Nira Conn A, NP      ? magnesium hydroxide (MILK OF MAGNESIA) suspension 30 mL  30 mL Oral Daily PRN Nira Conn A, NP      ? melatonin tablet 5 mg  5 mg Oral QHS Nwoko, Agnes I, NP   5 mg at 05/30/21 2112  ?  nicotine (NICODERM CQ - dosed in mg/24 hours) patch 14 mg  14 mg Transdermal Daily Massengill, Harrold Donath, MD   14 mg at 05/30/21 0849  ? [START ON 06/01/2021] risperiDONE (RISPERDAL) tablet 2 mg  2 mg Oral Dail

## 2021-05-31 NOTE — BHH Group Notes (Signed)
Adult Psychoeducational Group Note ? ?Date:  05/31/2021 ?Time:  9:58 AM ? ?Group Topic/Focus:  ?Goals Group:   The focus of this group is to help patients establish daily goals to achieve during treatment and discuss how the patient can incorporate goal setting into their daily lives to aide in recovery. ?Orientation:   The focus of this group is to educate the patient on the purpose and policies of crisis stabilization and provide a format to answer questions about their admission.  The group details unit policies and expectations of patients while admitted. ? ?Participation Level:  Active ? ?Participation Quality:  Appropriate ? ?Affect:  Appropriate ? ?Cognitive:  Appropriate ? ?Insight: Appropriate ? ?Engagement in Group:  Engaged ? ?Modes of Intervention:  Activity ? ?Additional Comments:  Patient participated in the goals group activity. ? ?Jearl Klinefelter ?05/31/2021, 9:58 AM ?

## 2021-05-31 NOTE — Plan of Care (Signed)
?  Problem: Education: ?Goal: Knowledge of [place] [facility] Education information/materials will improve ?Outcome: Progressing ?Goal: Emotional status will improve ?Outcome: Progressing ?  ?Problem: Activity: ?Goal: Sleeping patterns will improve ?Outcome: Progressing ?  ?Problem: Physical Regulation: ?Goal: Ability to maintain clinical measurements within normal limits will improve ?Outcome: Progressing ?  ?Problem: Safety: ?Goal: Periods of time without injury will increase ?Outcome: Progressing ?  ?Problem: Medication: ?Goal: Compliance with prescribed medication regimen will improve ?Outcome: Progressing ?  ?Problem: Self-Concept: ?Goal: Ability to disclose and discuss suicidal ideas will improve ?Outcome: Progressing ?  ?Problem: Coping: ?Goal: Will verbalize feelings ?Outcome: Progressing ?  ?Problem: Education: ?Goal: Mental status will improve ?Outcome: Not Progressing ?  ?Problem: Activity: ?Goal: Interest or engagement in activities will improve ?Outcome: Not Progressing ?  ?Problem: Education: ?Goal: Ability to make informed decisions regarding treatment will improve ?Outcome: Not Progressing ?  ?Problem: Self-Concept: ?Goal: Will verbalize positive feelings about self ?Outcome: Not Progressing ?  ?

## 2021-05-31 NOTE — Group Note (Signed)
[place] LCSW Group Therapy Note ? ?Date/Time:  05/31/2021  11:00AM-12:00PM ? ?Type of Therapy and Topic:  Group Therapy:  Music and Mood ? ?Participation Level:  Did Not Attend  ? ?Description of Group: ?In this process group, members listened to a variety of genres of music and identified that different types of music evoke different responses.  Patients were encouraged to identify music that was soothing for them and music that was energizing for them.  Patients discussed how this knowledge can help with wellness and recovery in various ways including managing depression and anxiety as well as encouraging healthy sleep habits.   ? ?Therapeutic Goals: ?Patients will explore the impact of different varieties of music on mood ?Patients will verbalize the thoughts they have when listening to different types of music ?Patients will identify music that is soothing to them as well as music that is energizing to them ?Patients will discuss how to use this knowledge to assist in maintaining wellness and recovery ?Patients will explore the use of music as a coping skill ? ?Summary of Patient Progress:  The group was invited to group, did not attend. ? ?Therapeutic Modalities: ?Solution Focused Brief Therapy ?Activity ? ? ?Selmer Dominion, LCSW ?  ?

## 2021-06-01 DIAGNOSIS — G40909 Epilepsy, unspecified, not intractable, without status epilepticus: Secondary | ICD-10-CM | POA: Diagnosis not present

## 2021-06-01 DIAGNOSIS — F203 Undifferentiated schizophrenia: Secondary | ICD-10-CM | POA: Diagnosis not present

## 2021-06-01 DIAGNOSIS — F1010 Alcohol abuse, uncomplicated: Secondary | ICD-10-CM

## 2021-06-01 LAB — RPR: RPR Ser Ql: NONREACTIVE

## 2021-06-01 LAB — CERULOPLASMIN: Ceruloplasmin: 12.3 mg/dL — ABNORMAL LOW (ref 16.0–31.0)

## 2021-06-01 LAB — ANA W/REFLEX IF POSITIVE: Anti Nuclear Antibody (ANA): NEGATIVE

## 2021-06-01 MED ORDER — RISPERIDONE 1 MG PO TABS
1.0000 mg | ORAL_TABLET | Freq: Every day | ORAL | Status: DC
  Administered 2021-06-02 – 2021-06-03 (×2): 1 mg via ORAL
  Filled 2021-06-01 (×4): qty 1

## 2021-06-01 NOTE — Progress Notes (Signed)
[facility] MD Progress Note ? ?06/01/2021 11:55 AM ?Stephen [name]  ?MRN:  [id number] ? ?Subjective: : 26 year old AA male with prior hx of depression, generalized anxiety disorder & oppositional disorder. He does have hx of seizure disorder as well. He was brought to the [facility] from the [facility] with complain of suicidal ideations with plans to buy a gun from online to kill himself. Chart review indicated that indicated that he was [name] having homicidal ideations towards his family (parents & siblings) because of an argument. Stephen [name] was a patient in this [facility] on the adolescent's unit in 2016. His diagnosis then was oppositional defiant disorder. After the evaluation at the [facility], he was transferred to the [facility] for further psychiatric evaluation/treatments. The care everywhere reports indicated patient has been on Citalopram & Lamictal in the remote past. His currently under IVC petition. ? ?Daily notes: Stephen [name] is seen, chart reviewed. The chart findings discussed with his treatment team.  He feels sleepy and drowsy. He still has poverty of speech.  He responds in few words or short sentences and only speaks to answer questions.  He has poor eye contact.  His speech is clear, but he stammers sometimes.  He fumbles with his thoughts sometimes. he has been tolerating his medication well without any side effects other than feeling little drowsy this morning.  He reports that he slept about 6 hours last night.  Currently, he denies any SI, HI, AVH.  He denies any ideas of reference.  He lives with his dad.  Encouraged him to attend groups. ? ?Talked to mom ?Principal Problem: Schizophrenia (HCC) ? ?Diagnosis: Principal Problem: ?  Schizophrenia (HCC) ?Active Problems: ?  Seizure disorder (HCC) ?  MDD (major depressive disorder), recurrent episode, moderate (HCC) ?  Tobacco use disorder ?  Mild alcohol use disorder ? ?Total Time spent with patient:  20 minutes . ?I personally spent 20 minutes on the unit in direct patient care. The direct  patient care time included face-to-face time with the patient, reviewing the patient's chart, communicating with other professionals, and coordinating care. Greater than 50% of this time was spent in counseling or coordinating care with the patient regarding goals of hospitalization, psycho-education, and discharge planning needs.  ?Past Psychiatric History: See H&P ? ?Past Medical History:  ?Past Medical History:  ?Diagnosis Date  ? 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 Depression   ? Seizures (HCC)   ?  ?Past Surgical History:  ?Procedure Laterality Date  ? CIRCUMCISION    ? SHOULDER CLOSED REDUCTION Right 12/08/2018  ? Procedure: CLOSED REDUCTION SHOULDER;  Surgeon: Yolonda Kida, MD;  Location: [facility] OR;  Service: Orthopedics;  Laterality: Right;  ? ?Family History:  ?Family History  ?Problem Relation Age of Onset  ? Diabetes gravidarum Paternal Grandmother   ? Heart attack Paternal Grandmother   ? Healthy Mother   ? Healthy Father   ? ?Family Psychiatric  History: See H&P ? ?Social History:  ?Social History  ? ?Substance and Sexual Activity  ?Alcohol Use Yes  ? Alcohol/week: 3.0 standard drinks  ? Types: 3 Cans of beer per week  ? Comment: every night  ?   ?Social History  ? ?Substance and Sexual Activity  ?Drug Use No  ?  ?Social History  ? ?Socioeconomic History  ? Marital status: Single  ?  Spouse name: Not on file  ? Number of children: 0  ? Years of education: Currently in college  ? Highest education level: Not on  file  ?Occupational History  ? Occupation: Consulting civil engineertudent at [employer]  ?Tobacco Use  ? Smoking status: Every Day  ?  Types: Cigarettes  ? Smokeless tobacco: Never  ?Vaping Use  ? Vaping Use: Every day  ? Substances: Nicotine  ?Substance and Sexual Activity  ? Alcohol use: Yes  ?  Alcohol/week: 3.0 standard drinks  ?  Types: 3 Cans of beer per week  ?  Comment: every night  ? Drug use: No  ? Sexual activity: Never  ?Other Topics Concern  ? Not on file  ?Social History Narrative  ?  Rosanne [name] is attending [employer]. 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  ? Living with his father, step-mother, and siblings.  ? Right-handed.  ? He will sometimes drink a soda.  ? ?Social Determinants of Health  ? ?Financial Resource Strain: Not on file  ?Food Insecurity: Not on file  ?Transportation Needs: Not on file  ?Physical Activity: Not on file  ?Stress: Not on file  ?Social Connections: Not on file  ? ?Additional Social History:  ? ?Sleep: Good ? ?Appetite:  Good ? ?Current Medications: ?Current Facility-Administered Medications  ?Medication Dose Route Frequency Provider Last Rate Last Admin  ? acetaminophen (TYLENOL) tablet 650 mg  650 mg Oral Q6H PRN Jackelyn PolingBerry, Jason A, NP   650 mg at 05/29/21 2048  ? alum & mag hydroxide-simeth (MAALOX/MYLANTA) 200-200-20 MG/5ML suspension 30 mL  30 mL Oral Q4H PRN Nira ConnBerry, Jason A, NP      ? citalopram (CELEXA) tablet 10 mg  10 mg Oral Daily Armandina StammerNwoko, Agnes I, NP   10 mg at 06/01/21 16100759  ? hydrOXYzine (ATARAX) tablet 25 mg  25 mg Oral TID PRN Jackelyn PolingBerry, Jason A, NP   25 mg at 05/31/21 2138  ? lamoTRIgine (LAMICTAL) tablet 50 mg  50 mg Oral QHS Phineas InchesMassengill, Nathan, MD   50 mg at 05/31/21 2039  ? LORazepam (ATIVAN) injection 2 mg  2 mg Intramuscular BID PRN Massengill, Harrold DonathNathan, MD      ? magnesium hydroxide (MILK OF MAGNESIA) suspension 30 mL  30 mL Oral Daily PRN Nira ConnBerry, Jason A, NP      ? melatonin tablet 5 mg  5 mg Oral QHS Nwoko, Agnes I, NP   5 mg at 05/31/21 2040  ? nicotine (NICODERM CQ - dosed in mg/24 hours) patch 14 mg  14 mg Transdermal Daily Massengill, Harrold DonathNathan, MD   14 mg at 05/30/21 0849  ? OLANZapine zydis (ZYPREXA) disintegrating tablet 5 mg  5 mg Oral Q8H PRN Nira ConnBerry, Jason A, NP   5 mg at 05/31/21 2138  ? And  ? ziprasidone (GEODON) injection 20 mg  20 mg Intramuscular PRN Jackelyn PolingBerry, Jason A, NP      ? [START ON 06/02/2021] risperiDONE (RISPERDAL) tablet 1 mg  1 mg Oral Daily Ninamarie Keel, MD      ? risperiDONE (RISPERDAL) tablet 6 mg  6 mg Oral QHS Karsten Rooda, Elora Wolter, MD   6 mg at 05/31/21  2040  ? vitamin B-12 (CYANOCOBALAMIN) tablet 1,000 mcg  1,000 mcg Oral Daily Karsten Rooda, Venice Liz, MD   1,000 mcg at 06/01/21 0759  ? ?Lab Results:  ?Results for orders placed or performed during the hospital encounter of 05/28/21 (from the past 48 hour(s))  ?Vitamin B12     Status: Abnormal  ? Collection Time: 05/31/21  7:37 AM  ?Result Value Ref Range  ? Vitamin B-12 169 (L) 180 - 914 pg/mL  ?  Comment: (NOTE) ?This assay is not validated for testing neonatal  or ?myeloproliferative syndrome specimens for Vitamin B12 levels. ?Performed at [facility], [address] ?[place], [place] [address] ?  ?Rapid HIV screen (HIV 1/2 Ab+Ag)     Status: None  ? Collection Time: 05/31/21  7:37 AM  ?Result Value Ref Range  ? HIV-1 P24 Antigen - HIV24 NON REACTIVE NON REACTIVE  ?  Comment: (NOTE) ?Detection of p24 may be inhibited by biotin in the sample, causing ?false negative results in acute infection. ?  ? HIV 1/2 Antibodies NON REACTIVE NON REACTIVE  ? Interpretation (HIV Ag Ab)    ?  A non reactive test result means that HIV 1 or HIV 2 antibodies and HIV 1 p24 antigen were not detected in the specimen.  ?  Comment: RESULT CALLED TO, READ BACK BY AND VERIFIED WITH: ?ANTHONY RN @ [phone] ON 05/31/2021 BY LUZOLOP ?Performed at [facility], [address], [place], [place] [address] ?  ?Sedimentation rate     Status: None  ? Collection Time: 05/31/21  7:37 AM  ?Result Value Ref Range  ? Sed Rate 5 0 - 16 mm/hr  ?  Comment: Performed at [facility], [address] [place], [place] [address]  ?RPR     Status: None  ? Collection Time: 05/31/21  7:37 AM  ?Result Value Ref Range  ? RPR Ser Ql NON REACTIVE NON REACTIVE  ?  Comment: Performed at [facility], [address], [place], [place] [address]  ? ? ?Blood Alcohol level:  ?Lab Results  ?Component Value Date  ? ETH <10 05/26/2021  ? ETH 45 (H) 01/31/2021  ? ?Metabolic Disorder Labs: ?Lab Results  ?Component Value Date  ?  HGBA1C 5.4 05/26/2021  ? MPG 108.28 05/26/2021  ? ?No results found for: PROLACTIN ?Lab Results  ?Component Value Date  ? CHOL 152 05/26/2021  ? TRIG 48 05/26/2021  ? HDL 69 05/26/2021  ? CHOLHDL 2.2 0

## 2021-06-01 NOTE — Progress Notes (Signed)
Patient appears pleasant and guarded. Patient denies SI/HI/AVH. Pt did not want to leave the unit for lunch but did attend group. Patient complied with morning medication with no reported side effects. Pt said he is no longer feeling dizzy. Patient remains safe on Q48min checks and contracts for safety.  ? ? ? 06/01/21 0759  ?Psychosocial Assessment  ?Patient Complaints Anxiety  ?Eye Contact Fair  ?Facial Expression Anxious  ?Affect Anxious  ?Speech Logical/coherent  ?Interaction Assertive;Guarded  ?Motor Activity Fidgety  ?Appearance/Hygiene Unremarkable  ?Behavior Characteristics Cooperative;Appropriate to situation  ?Mood Anxious;Depressed  ?Thought Process  ?Coherency Concrete thinking  ?Content WDL  ?Delusions WDL  ?Perception WDL  ?Hallucination None reported or observed  ?Judgment Impaired  ?Confusion None  ?Danger to Self  ?Current suicidal ideation? Denies  ?Danger to Others  ?Danger to Others None reported or observed  ? ? ?

## 2021-06-01 NOTE — [facility] Group Notes (Signed)
Adult Psychoeducational Group Note ? ?Date:  06/01/2021 ?Time:  2:29 PM ? ?Group Topic/Focus:  ?Wellness Toolbox:   The focus of this group is to discuss various aspects of wellness, balancing those aspects and exploring ways to increase the ability to experience wellness.  Patients will create a wellness toolbox for use upon discharge. ? ?Participation Level:  Active ? ?Participation Quality:  Appropriate ? ?Affect:  Appropriate ? ?Cognitive:  Appropriate ? ?Insight: Appropriate ? ?Engagement in Group:  Engaged ? ?Modes of Intervention:  Activity ? ?Additional Comments:  Patient attended and participated in the relaxation group. ? ?Annie Sable ?06/01/2021, 2:29 PM ?

## 2021-06-01 NOTE — Group Note (Signed)
Recreation Therapy Group Note ? ? ?Group Topic:Anger Management  ?Group Date: 06/01/2021 ?Start Time: 1005 ?End Time: 1030 ?Facilitators: Caroll Rancher, LRT,CTRS ?Location: [address] ? ? ?Personal Outcomes: ?Patient will be able to identify what causes anger. ?Patient will identify what emotions lye beneath anger. ?Patient will identify coping skills to deal with anger. ? ?Group Description:  Anger/Umbrella of Emotion.  LRT and patients had discussion on what causes anger and the underlying emotions covered up by anger.  Patients also discussed ways in which they act out when angry.  Patients were then given a worksheet that focused on underlying emotions of anger (ie hurt, rejection, fear and insecurity).  Patients were to identify more underlying emotions that lead them to anger.  Patients then had to identify at least 5 positive coping skills that could be used when angry. ? ? ?Affect/Mood: N/A ?  ?Participation Level: Did not attend ?  ? ?Clinical Observations/Individualized Feedback:   ? ? ?Plan: Continue to engage patient in RT group sessions 2-3x/week. ? ? ?Caroll Rancher, LRT,CTRS ?06/01/2021 12:33 PM ?

## 2021-06-01 NOTE — Progress Notes (Signed)
Patient reports his depression is an 8/10 and hopelessness a 9/10. Patient's goal for the day is to work on "my problem's with me". Patient remains safe on q15 min checks. ?

## 2021-06-01 NOTE — Progress Notes (Signed)
?   06/01/21 0500  ?Sleep  ?Number of Hours 5  ? ? ?

## 2021-06-01 NOTE — Group Note (Signed)
[facility] LCSW Group Therapy ? ? ?Type of Therapy and Topic:  Group Therapy:  Wellness ? ? ?Participation Level: Active ? ?Description of Group: ?This group allows individuals to explore the 6 dimensions of wellness, including spiritual, emotional, intellectual, physical, social, environmental, financial and spiritual. Patients will learn to different ways to practice wellness to improve well-being. Patients also participated in a conversation about what wellness means to them.   Individuals will think about ways in which they currently practice wellness as well as ways they can improve their wellness and new ways to practice wellness.   ? ? ?  ?Therapeutic Goals ?Patient will verbalize 1 pr 2 we;;mess areas where they are doing well. ?Patient will identify 2 areas where they would like to improve their wellness.   ?Patient will provide a definition of what wellness means to them.  ?Patients will reflect on current hospitalization and primary areas to maintain mental health to prevent re-hospitalization.  ?  ? ?Summary of Patient Progress:  Patient participated appropriately in group. Patient reports that being well looks like having a wife, kids, money and fast cars.  Patient had good insight into group topic and accepted feedback from others regarding group activity. ? ?  ?  ? ?Therapeutic Modalities ?Cognitive Behavioral Therapy ?Motivational Interviewing ? ? ?Glendell Fouse, LCSW, LCAS ?Clincal Social Worker  ?[facility] ? ? ?

## 2021-06-01 NOTE — Progress Notes (Signed)
?   06/01/21 0300  ?Psychosocial Assessment  ?Patient Complaints Anxiety;Hopelessness  ?Eye Contact Fair  ?Facial Expression Anxious  ?Affect Anxious  ?Speech Logical/coherent  ?Interaction Assertive;Guarded  ?Motor Activity Fidgety;Pacing  ?Appearance/Hygiene Unremarkable  ?Behavior Characteristics Cooperative  ?Mood Anxious;Depressed  ?Thought Process  ?Coherency Concrete thinking  ?Content WDL  ?Delusions WDL  ?Perception WDL  ?Hallucination None reported or observed  ?Judgment Impaired  ?Confusion None  ?Danger to Self  ?Current suicidal ideation? Denies  ?Danger to Others  ?Danger to Others None reported or observed  ? ? ?

## 2021-06-01 NOTE — Plan of Care (Signed)
?  Problem: Education: ?Goal: Knowledge of [facility] [facility] Education information/materials will improve ?Outcome: Progressing ?Goal: Emotional status will improve ?Outcome: Progressing ?Goal: Mental status will improve ?Outcome: Progressing ?Goal: Verbalization of understanding the information provided will improve ?Outcome: Progressing ?  ?Problem: Activity: ?Goal: Sleeping patterns will improve ?Outcome: Progressing ?  ?Problem: Safety: ?Goal: Periods of time without injury will increase ?Outcome: Progressing ?  ?Problem: Coping: ?Goal: Coping ability will improve ?Outcome: Progressing ?  ?Problem: Health Behavior/Discharge Planning: ?Goal: Identification of resources available to assist in meeting health care needs will improve ?Outcome: Progressing ?  ?Problem: Activity: ?Goal: Imbalance in normal sleep/wake cycle will improve ?Outcome: Progressing ?  ?Problem: Self-Concept: ?Goal: Level of anxiety will decrease ?Outcome: Progressing ?  ?

## 2021-06-02 DIAGNOSIS — F209 Schizophrenia, unspecified: Secondary | ICD-10-CM

## 2021-06-02 MED ORDER — CITALOPRAM HYDROBROMIDE 20 MG PO TABS
20.0000 mg | ORAL_TABLET | Freq: Every day | ORAL | Status: DC
  Administered 2021-06-03: 20 mg via ORAL
  Filled 2021-06-02 (×2): qty 1

## 2021-06-02 MED ORDER — PALIPERIDONE ER 1.5 MG PO TB24
1.5000 mg | ORAL_TABLET | Freq: Every day | ORAL | Status: AC
  Administered 2021-06-02: 1.5 mg via ORAL
  Filled 2021-06-02: qty 1

## 2021-06-02 MED ORDER — NICOTINE POLACRILEX 2 MG MT GUM
2.0000 mg | CHEWING_GUM | OROMUCOSAL | Status: DC | PRN
  Administered 2021-06-04: 2 mg via ORAL

## 2021-06-02 MED ORDER — CITALOPRAM HYDROBROMIDE 10 MG PO TABS
10.0000 mg | ORAL_TABLET | Freq: Once | ORAL | Status: AC
  Administered 2021-06-02: 10 mg via ORAL
  Filled 2021-06-02 (×2): qty 1

## 2021-06-02 NOTE — Progress Notes (Signed)
?   06/02/21 1811  ?Psych Admission Type (Psych Patients Only)  ?Admission Status Involuntary  ?Psychosocial Assessment  ?Patient Complaints Anxiety  ?Eye Contact Fair  ?Facial Expression Flat  ?Affect Blunted  ?Speech Logical/coherent;Pressured;Other (Comment) ?(stuttering)  ?Interaction Assertive  ?Motor Activity Fidgety;Pacing  ?Appearance/Hygiene Unremarkable  ?Behavior Characteristics Cooperative;Calm  ?Mood Depressed;Anxious  ?Thought Process  ?Coherency Concrete thinking  ?Content WDL  ?Delusions None reported or observed  ?Perception WDL  ?Hallucination None reported or observed  ?Judgment Limited  ?Confusion None  ?Danger to Self  ?Current suicidal ideation? Denies  ?Danger to Others  ?Danger to Others None reported or observed  ? ? ?

## 2021-06-02 NOTE — [facility] Group Notes (Signed)
Adult Psychoeducational Group Note ? ?Date:  06/02/2021 ?Time:  8:54 PM ? ?Group Topic/Focus:  ?Wrap-Up Group:   The focus of this group is to help patients review their daily goal of treatment and discuss progress on daily workbooks. ? ?Participation Level:  Active ? ?Participation Quality:  Attentive ? ?Affect:  Appropriate ? ?Cognitive:  Alert ? ?Insight: Appropriate ? ?Engagement in Group:  Engaged ? ?Modes of Intervention:  Discussion ? ?Additional Comments:  Patient attended and participated in the Wrap-Up group. ? ?Annie Sable ?06/02/2021, 8:54 PM ?

## 2021-06-02 NOTE — Plan of Care (Signed)
°  Problem: Activity: °Goal: Interest or engagement in activities will improve °Outcome: Progressing °  °Problem: Coping: °Goal: Ability to verbalize frustrations and anger appropriately will improve °Outcome: Progressing °  °Problem: Coping: °Goal: Ability to demonstrate self-control will improve °Outcome: Progressing °  °Problem: Health Behavior/Discharge Planning: °Goal: Compliance with treatment plan for underlying cause of condition will improve °Outcome: Progressing °  °Problem: Safety: °Goal: Periods of time without injury will increase °Outcome: Progressing °  °

## 2021-06-02 NOTE — Progress Notes (Signed)
?   06/02/21 0500  ?Psych Admission Type (Psych Patients Only)  ?Admission Status Involuntary  ?Psychosocial Assessment  ?Patient Complaints Anxiety  ?Eye Contact Fair  ?Facial Expression Anxious  ?Affect Anxious  ?Speech Logical/coherent  ?Interaction Assertive;Guarded  ?Motor Activity Fidgety;Pacing  ?Appearance/Hygiene Unremarkable  ?Behavior Characteristics Cooperative;Appropriate to situation  ?Mood Anxious;Depressed  ?Thought Process  ?Coherency Concrete thinking  ?Content WDL  ?Delusions WDL  ?Perception WDL  ?Hallucination None reported or observed  ?Judgment Impaired  ?Confusion None  ?Danger to Self  ?Current suicidal ideation? Denies  ?Danger to Others  ?Danger to Others None reported or observed  ? ? ?

## 2021-06-02 NOTE — [facility] Group Notes (Signed)
Adult Goals Group Note ? ?Date:  06/02/2021 ?Time:  10:04 AM ? ?Group Topic/Focus:  ?Goals Group:   The focus of this group is to help patients establish daily goals to achieve during treatment and discuss how the patient can incorporate goal setting into their daily lives to aide in recovery. ? ?Participation Level:  Active ? ?Participation Quality:  Appropriate ? ?Affect:  Appropriate ? ?Cognitive:  Appropriate ? ?Insight: Appropriate ? ?Engagement in Group:  Engaged ? ?Modes of Intervention:  Education ? ?Additional Comments:  Pt wants help with nicotine addiction as their goal today.  ? ?Thomas Hoff ?06/02/2021, 10:04 AM ?

## 2021-06-02 NOTE — Progress Notes (Signed)
Pt came to staff stating he was feeling like he was having a seizure, pt was not observed to have any seizure like Sx, pt was coherent and talking the whole time, pt was given PRN IM Ativan per [facility] , will continue to monitor ?

## 2021-06-02 NOTE — Progress Notes (Signed)
[facility] MD Progress Note ? ?06/02/2021 10:53 AM ?Stephen [name]  ?MRN:  [id number] ? ?Subjective: : 26 year old AA male with prior hx of depression, generalized anxiety disorder & oppositional disorder. He does have hx of seizure disorder as well. He was brought to the [facility] from the [facility] with complain of suicidal ideations with plans to buy a gun from online to kill himself. Chart review indicated that indicated that he was Stephen [name] having homicidal ideations towards his family (parents & siblings) because of an argument. Stephen [name] was a patient in this [facility] on the adolescent's unit in 2016. His diagnosis then was oppositional defiant disorder. After the evaluation at the [facility], he was transferred to the [facility] for further psychiatric evaluation/treatments. The care everywhere reports indicated patient has been on Citalopram & Lamictal in the remote past. His currently under IVC petition. ? ?Daily notes: Stephen [name] is seen, chart reviewed. The chart findings discussed with his treatment team.  He still has poverty of speech.  He responds in few words or short sentences and only speaks to answer questions. His speech is clear, but he stammers sometimes.  His eye contact is poor but better than previous interactions. He reports that his mood is fair but he is still feeling depressed.  He denies any anxiety.  He reports that he did not sleep well last night and kept waking up.  Chart review shows he slept 7 hours last night.  He has been tolerating his medication well without any side effects. Currently, he denies any SI, HI, AVH.  He denies any paranoia.  He talked to his mom yesterday and she asked him how he was doing.  He used to smoke a lot of cigarettes but is not craving for cigarettes.  He states he is not using nicotine patch and does not like it.  He is using nicotine gum.  Patient is interested in inpatient rehab such as sober living for alcohol addiction.  Discussed that we will ask CSW to help with rehab placement.  Discussed  LAI risperidone consta or Invega sustaina.  Pt states he has blue health insurance. Discussed that we will confirm with pharmacy.  He verbalizes understanding and agrees with the plan. ? ?Principal Problem: Schizophrenia (HCC) ? ?Diagnosis: Principal Problem: ?  Schizophrenia (HCC) ?Active Problems: ?  Seizure disorder (HCC) ?  MDD (major depressive disorder), recurrent episode, moderate (HCC) ?  Tobacco use disorder ?  Mild alcohol use disorder ? ?Total Time spent with patient:  20 minutes . ?I personally spent 20 minutes on the unit in direct patient care. The direct patient care time included face-to-face time with the patient, reviewing the patient's chart, communicating with other professionals, and coordinating care. Greater than 50% of this time was spent in counseling or coordinating care with the patient regarding goals of hospitalization, psycho-education, and discharge planning needs.  ?Past Psychiatric History: See H&P ? ?Past Medical History:  ?Past Medical History:  ?Diagnosis Date  ? 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 Depression   ? Seizures (HCC)   ?  ?Past Surgical History:  ?Procedure Laterality Date  ? CIRCUMCISION    ? SHOULDER CLOSED REDUCTION Right 12/08/2018  ? Procedure: CLOSED REDUCTION SHOULDER;  Surgeon: Yolonda Kida, MD;  Location: [facility] OR;  Service: Orthopedics;  Laterality: Right;  ? ?Family History:  ?Family History  ?Problem Relation Age of Onset  ? Diabetes gravidarum Paternal Grandmother   ? Heart attack Paternal Grandmother   ? Healthy Mother   ?  Healthy Father   ? ?Family Psychiatric  History: See H&P ? ?Social History:  ?Social History  ? ?Substance and Sexual Activity  ?Alcohol Use Yes  ? Alcohol/week: 3.0 standard drinks  ? Types: 3 Cans of beer per week  ? Comment: every night  ?   ?Social History  ? ?Substance and Sexual Activity  ?Drug Use No  ?  ?Social History  ? ?Socioeconomic History  ? Marital status: Single  ?  Spouse name: Not on file  ?  Number of children: 0  ? Years of education: Currently in college  ? Highest education level: Not on file  ?Occupational History  ? Occupation: Consulting civil engineertudent at [employer]  ?Tobacco Use  ? Smoking status: Every Day  ?  Types: Cigarettes  ? Smokeless tobacco: Never  ?Vaping Use  ? Vaping Use: Every day  ? Substances: Nicotine  ?Substance and Sexual Activity  ? Alcohol use: Yes  ?  Alcohol/week: 3.0 standard drinks  ?  Types: 3 Cans of beer per week  ?  Comment: every night  ? Drug use: No  ? Sexual activity: Never  ?Other Topics Concern  ? Not on file  ?Social History Narrative  ? Stephen [name] is attending [employer]. 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  ? Living with his father, step-mother, and siblings.  ? Right-handed.  ? He will sometimes drink a soda.  ? ?Social Determinants of Health  ? ?Financial Resource Strain: Not on file  ?Food Insecurity: Not on file  ?Transportation Needs: Not on file  ?Physical Activity: Not on file  ?Stress: Not on file  ?Social Connections: Not on file  ? ?Additional Social History:  ? ?Sleep: Fair kept waking up, charting shows he slept 7 hours ? ?Appetite:  Good ? ?Current Medications: ?Current Facility-Administered Medications  ?Medication Dose Route Frequency Provider Last Rate Last Admin  ? acetaminophen (TYLENOL) tablet 650 mg  650 mg Oral Q6H PRN Jackelyn PolingBerry, Jason A, NP   650 mg at 05/29/21 2048  ? alum & mag hydroxide-simeth (MAALOX/MYLANTA) 200-200-20 MG/5ML suspension 30 mL  30 mL Oral Q4H PRN Nira ConnBerry, Jason A, NP      ? citalopram (CELEXA) tablet 10 mg  10 mg Oral Daily Armandina StammerNwoko, Agnes I, NP   10 mg at 06/02/21 0757  ? hydrOXYzine (ATARAX) tablet 25 mg  25 mg Oral TID PRN Jackelyn PolingBerry, Jason A, NP   25 mg at 06/01/21 2119  ? lamoTRIgine (LAMICTAL) tablet 50 mg  50 mg Oral QHS Phineas InchesMassengill, Nathan, MD   50 mg at 06/01/21 2119  ? LORazepam (ATIVAN) injection 2 mg  2 mg Intramuscular BID PRN Massengill, Harrold DonathNathan, MD      ? magnesium hydroxide (MILK OF MAGNESIA) suspension 30 mL  30 mL Oral Daily PRN Nira ConnBerry, Jason A, NP       ? melatonin tablet 5 mg  5 mg Oral QHS Nwoko, Agnes I, NP   5 mg at 06/01/21 2120  ? nicotine (NICODERM CQ - dosed in mg/24 hours) patch 14 mg  14 mg Transdermal Daily Massengill, Harrold DonathNathan, MD   14 mg at 05/30/21 0849  ? OLANZapine zydis (ZYPREXA) disintegrating tablet 5 mg  5 mg Oral Q8H PRN Nira ConnBerry, Jason A, NP   5 mg at 05/31/21 2138  ? And  ? ziprasidone (GEODON) injection 20 mg  20 mg Intramuscular PRN Nira ConnBerry, Jason A, NP      ? risperiDONE (RISPERDAL) tablet 1 mg  1 mg Oral Daily Wm Sahagun, MD   1 mg  at 06/02/21 0757  ? risperiDONE (RISPERDAL) tablet 6 mg  6 mg Oral QHS Karsten Ro, MD   6 mg at 06/01/21 2119  ? vitamin B-12 (CYANOCOBALAMIN) tablet 1,000 mcg  1,000 mcg Oral Daily Karsten Ro, MD   1,000 mcg at 06/02/21 0758  ? ?Lab Results:  ?No results found for this or any previous visit (from the past 48 hour(s)). ? ? ?Blood Alcohol level:  ?Lab Results  ?Component Value Date  ? ETH <10 05/26/2021  ? ETH 45 (H) 01/31/2021  ? ?Metabolic Disorder Labs: ?Lab Results  ?Component Value Date  ? HGBA1C 5.4 05/26/2021  ? MPG 108.28 05/26/2021  ? ?No results found for: PROLACTIN ?Lab Results  ?Component Value Date  ? CHOL 152 05/26/2021  ? TRIG 48 05/26/2021  ? HDL 69 05/26/2021  ? CHOLHDL 2.2 05/26/2021  ? VLDL 10 05/26/2021  ? LDLCALC 73 05/26/2021  ? LDLCALC 54 11/13/2014  ? ?Physical Findings: ?AIMS:  , ,  ,  ,    ?CIWA:  CIWA-Ar Total: 0 ?COWS:    ? ?Musculoskeletal: ?Strength & Muscle Tone: within normal limits ?Gait & Station: normal ?Patient leans: N/A ? ?Psychiatric Specialty Exam: ? ?Presentation  ?General Appearance: Appropriate for Environment; Casual ? ?Eye Contact:Poor ? ?Speech:Normal Rate (Limited engagement, answers 1 or 2 words or short sentences) ? ?Speech Volume:Normal ? ?Handedness:Right ? ?Mood and Affect  ?Mood:Anxious; Depressed ? ?Affect:Flat ? ?Thought Process  ?Thought Processes:Disorganized ? ?Descriptions of Associations:Intact ? ?Orientation:Full (Time, Place and Person) ? ?Thought  Content:Logical ? ?History of Schizophrenia/Schizoaffective disorder:No ? ?Duration of Psychotic Symptoms:Greater than six months ? ?Hallucinations:Hallucinations: None ?Description of Auditory Halluci

## 2021-06-02 NOTE — Group Note (Signed)
Recreation Therapy Group Note ? ? ?Group Topic:Self-Esteem  ?Group Date: 06/02/2021 ?Start Time: [phone] ?End Time: 1045 ?Facilitators: Caroll Rancher, LRT,CTRS ?Location: [address] Day[address] ? ? ?Goal Area(s) Addresses:  ?Patient will be able to identify self esteem is. ?Patient will successfully share benefits of healthy self esteem. ?Patient will identify how they see themselves. ?   ? Group Description: Patient and LRT discussed what self esteem is and how impacts each individual. Patients were given a sheet of paper with blank faces on it.  Patients were to create an image of how they see themselves by using words, images or both. Patients were given colored pencils, crayons and pencils to complete the assignment. Patients shared their completed assignment with each other.  ? ? ?Affect/Mood: Appropriate ?  ?Participation Level: Engaged ?  ?Participation Quality: Independent ?  ?Behavior: Appropriate ?  ?Speech/Thought Process: Focused ?  ?Insight: Good ?  ?Judgement: Good ?  ?Modes of Intervention: Art and Music ?  ?Patient Response to Interventions:  Engaged ?  ?Education Outcome: ? Acknowledges education and In group clarification offered   ? ?Clinical Observations/Individualized Feedback: Pt was quiet but asked questions when needed.  Pt was appropriate and attentive during group.  Pt described himself as very talkative when he wants to a serious conversation, likes to do things by himself, likes to be very antisocial and can get very angry at times.  Pt also expressed he is the oldest of his siblings.  Pt went on to say he wished he was the youngest because he gets more responsibility put on him being the oldest.  ?  ? ?Plan: Continue to engage patient in RT group sessions 2-3x/week. ? ? ?Caroll Rancher, LRT,CTRS ?06/02/2021 11:33 AM ?

## 2021-06-02 NOTE — Progress Notes (Signed)
Pt continues to be disorganized at times and very paranoid on the unit ? ? ? 06/02/21 2115  ?Psych Admission Type (Psych Patients Only)  ?Admission Status Involuntary  ?Psychosocial Assessment  ?Patient Complaints Anxiety  ?Eye Contact Fair  ?Facial Expression Flat  ?Affect Preoccupied;Anxious  ?Speech Logical/coherent  ?Interaction Assertive  ?Motor Activity Pacing;Fidgety  ?Appearance/Hygiene Unremarkable  ?Behavior Characteristics Cooperative  ?Mood Suspicious;Preoccupied  ?Aggressive Behavior  ?Effect No apparent injury  ?Thought Process  ?Coherency Concrete thinking;Flight of ideas  ?Content Preoccupation  ?Delusions Paranoid  ?Perception WDL  ?Hallucination None reported or observed  ?Judgment Limited  ?Confusion WDL  ?Danger to Self  ?Current suicidal ideation? Denies  ? ? ?

## 2021-06-02 NOTE — Progress Notes (Signed)
?   06/02/21 0534  ?Sleep  ?Number of Hours 7  ? ? ?

## 2021-06-03 ENCOUNTER — Encounter (HOSPITAL_COMMUNITY): Payer: Self-pay

## 2021-06-03 LAB — CBC
HCT: 41.1 % (ref 39.0–52.0)
Hemoglobin: 13.9 g/dL (ref 13.0–17.0)
MCH: 30.9 pg (ref 26.0–34.0)
MCHC: 33.8 g/dL (ref 30.0–36.0)
MCV: 91.3 fL (ref 80.0–100.0)
Platelets: 246 10*3/uL (ref 150–400)
RBC: 4.5 MIL/uL (ref 4.22–5.81)
RDW: 13 % (ref 11.5–15.5)
WBC: 3.9 10*3/uL — ABNORMAL LOW (ref 4.0–10.5)
nRBC: 0 % (ref 0.0–0.2)

## 2021-06-03 LAB — COMPREHENSIVE METABOLIC PANEL
ALT: 15 U/L (ref 0–44)
AST: 23 U/L (ref 15–41)
Albumin: 4.3 g/dL (ref 3.5–5.0)
Alkaline Phosphatase: 70 U/L (ref 38–126)
Anion gap: 7 (ref 5–15)
BUN: 8 mg/dL (ref 6–20)
CO2: 27 mmol/L (ref 22–32)
Calcium: 9 mg/dL (ref 8.9–10.3)
Chloride: 103 mmol/L (ref 98–111)
Creatinine, Ser: 0.84 mg/dL (ref 0.61–1.24)
GFR, Estimated: >60 mL/min (ref >60)
Glucose, Bld: 144 mg/dL — ABNORMAL HIGH (ref 70–99)
Potassium: 3.7 mmol/L (ref 3.5–5.1)
Sodium: 137 mmol/L (ref 135–145)
Total Bilirubin: 0.4 mg/dL (ref 0.3–1.2)
Total Protein: 7.8 g/dL (ref 6.5–8.1)

## 2021-06-03 LAB — CK: Total CK: 362 U/L (ref 49–397)

## 2021-06-03 MED ORDER — BENZTROPINE MESYLATE 1 MG PO TABS
1.0000 mg | ORAL_TABLET | Freq: Two times a day (BID) | ORAL | Status: DC
  Filled 2021-06-03 (×4): qty 1

## 2021-06-03 MED ORDER — BENZTROPINE MESYLATE 1 MG PO TABS: 1.0000 mg | ORAL_TABLET | Freq: Two times a day (BID) | ORAL | Status: DC

## 2021-06-03 MED ORDER — RISPERIDONE 2 MG PO TABS
2.0000 mg | ORAL_TABLET | Freq: Every day | ORAL | Status: DC
Start: 2021-06-03 — End: 2021-06-03
  Filled 2021-06-03 (×2): qty 1

## 2021-06-03 MED ORDER — BENZTROPINE MESYLATE 1 MG PO TABS
1.0000 mg | ORAL_TABLET | Freq: Every day | ORAL | Status: AC
  Administered 2021-06-03: 1 mg via ORAL
  Filled 2021-06-03: qty 1

## 2021-06-03 MED ORDER — LORAZEPAM 1 MG PO TABS
2.0000 mg | ORAL_TABLET | Freq: Once | ORAL | Status: AC
  Administered 2021-06-03: 2 mg via ORAL
  Filled 2021-06-03: qty 2

## 2021-06-03 MED ORDER — BENZTROPINE MESYLATE 1 MG PO TABS
1.0000 mg | ORAL_TABLET | Freq: Two times a day (BID) | ORAL | Status: DC
  Administered 2021-06-03 – 2021-06-04 (×2): 1 mg via ORAL
  Filled 2021-06-03 (×4): qty 1

## 2021-06-03 MED ORDER — BENZTROPINE MESYLATE 1 MG/ML IJ SOLN
2.0000 mg | Freq: Once | INTRAMUSCULAR | Status: AC
  Administered 2021-06-03: 2 mg via INTRAMUSCULAR
  Filled 2021-06-03: qty 2

## 2021-06-03 MED ORDER — ESCITALOPRAM OXALATE 10 MG PO TABS
10.0000 mg | ORAL_TABLET | Freq: Every day | ORAL | Status: DC
  Administered 2021-06-04 – 2021-06-07 (×4): 10 mg via ORAL
  Filled 2021-06-03 (×5): qty 1

## 2021-06-03 NOTE — Progress Notes (Signed)
Pt stated he was feeling funny, order for 2 mg PO ativan per  NP Nira Conn ?

## 2021-06-03 NOTE — Progress Notes (Signed)
Adult Psychoeducational Group Note ? ?Date:  06/03/2021 ?Time:  8:31 PM ? ?Group Topic/Focus:  ?Wrap-Up Group:   The focus of this group is to help patients review their daily goal of treatment and discuss progress on daily workbooks. ? ?Participation Level:  Active ? ?Participation Quality:  Appropriate ? ?Affect:  Appropriate ? ?Cognitive:  Appropriate ? ?Insight: Improving ? ?Engagement in Group:  Engaged ? ?Modes of Intervention:  Education and Exploration ? ?Additional Comments:  Patient attended and participated in group tonight. He reports that the one thing he learnt today that he can take with him when he leave is not to do too much alcohol and drugs ? ?Scot Dock ?06/03/2021, 8:31 PM ?

## 2021-06-03 NOTE — Progress Notes (Signed)
Pt was encouraged but didn't attend orientation/goals group. ?

## 2021-06-03 NOTE — Group Note (Signed)
[facility] LCSW Group Therapy Note ? ?Date/Time:1pm ? ?Type of Therapy and Topic:  Group Therapy:  Strengths and Qualities ? ?Participation Level:  Active ? ?Description of Group:   ? In this group patients will be asked to explore and define the terms strength ans qualities.  Patients will be guided to discuss their thoughts, feelings, and behaviors as to where strengths and qualities originate. Participants will then list some of their strengths and qualities related to each subject topic. This group will be process-oriented, with patients participating in exploration of their own experiences as well as giving and receiving support and challenge from other group members. ? ?Therapeutic Goals: ?Patient will identify specific strengths related to their personal life. ?Patient will identify feelings, thoughts, and beliefs about strengths and qualities. ?Patient will identify ways their strengths have been used. Marland Kitchen ?Patient will identify situations where they have helped others or made someone else happy. . ? ?Summary of Patient Progress ?Patient participated appropriately in group.  She reports that he appreciates the support of his dad and that he has a strength of communication.  Patient participated in group activity and provided and gave feedback to other group members.  Patient had good insight into group topic.  ? ? ? ? ?Therapeutic Modalities:   ?Cognitive Behavioral Therapy ?Solution Focused Therapy ?Motivational Interviewing ?Brief Therapy ? ? ?Konnor Jorden, LCSW, LCAS ?Clincal Social Worker  ?[facility] ? ? ?

## 2021-06-03 NOTE — Progress Notes (Signed)
Pt mother stated she would like a call from the doctor tomorrow. Pt mother concerned that pt had good sleep medications when he goes home due to pt being up all times of the night at the house. Writer tried to explain 24 hr urine collection , but pt appeared to bot be able to process the information at this time. Pt given 1x PO  2 mg Ativan for "feeling funny" , seizure preventative measure ? ? ? 06/03/21 2115  ?Psych Admission Type (Psych Patients Only)  ?Admission Status Involuntary  ?Psychosocial Assessment  ?Patient Complaints Anxiety  ?Eye Contact Fair  ?Facial Expression Flat  ?Affect Preoccupied;Anxious  ?Speech Logical/coherent  ?Interaction Assertive  ?Motor Activity Pacing;Fidgety  ?Appearance/Hygiene Unremarkable  ?Behavior Characteristics Cooperative  ?Mood Suspicious;Anxious;Preoccupied  ?Aggressive Behavior  ?Effect No apparent injury  ?Thought Process  ?Coherency Concrete thinking;Flight of ideas  ?Content Preoccupation  ?Delusions Paranoid  ?Perception WDL  ?Hallucination None reported or observed  ?Judgment Limited  ?Confusion WDL  ?Danger to Self  ?Current suicidal ideation? Denies  ? ? ?

## 2021-06-03 NOTE — Progress Notes (Signed)
Pt c/o of feeling stiff. 2mg   IM cogentin given per MD orders.  Pt also given po cogentin 3 hours later per instructions. Pt responded well to Cogentin. ?

## 2021-06-03 NOTE — Progress Notes (Signed)
[facility] MD Progress Note ? ?06/03/2021 2:51 PM ?Stephen [name]  ?MRN:  [id number] ? ?Subjective: : 26 year old AA male with prior hx of depression, generalized anxiety disorder & oppositional disorder. He does have hx of seizure disorder as well. He was brought to the [facility] from the [facility] with complain of suicidal ideations with plans to buy a gun from online to kill himself. Chart review indicated that indicated that he was [name] having homicidal ideations towards his family (parents & siblings) because of an argument. Aleksa was a patient in this [facility] on the adolescent's unit in 2016. His diagnosis then was oppositional defiant disorder. After the evaluation at the [facility], he was transferred to the [facility] for further psychiatric evaluation/treatments. The care everywhere reports indicated patient has been on Citalopram & Lamictal in the remote past. His currently under IVC petition. ? ?Daily notes: Kaspar is seen, chart reviewed. The chart findings discussed with his treatment team.  He still has poverty of speech. He responds in few words or short sentences. His speech is clear, but he stammers sometimes. His eye contact is poor . He reports that his mood is good and reports improvement in his depression and anxiety. He reports that he slept well last night. Chart review shows he slept 8 hours last night.  Discussed and educated about urine collection for 24-hour urine copper test.  Patient verbalizes understanding.  He reports medication side effects as sedation and palpitations . Currently, he denies any SI, HI, AVH. He denies any paranoia. He reports that he is planning to go to his mom after discharge.  Patient states he is not interested in inpatient rehab anymore. Discussed that we are planning to give him Invega sustaina today.  He verbalizes understanding.  ?On examination, patient has significant stiffness in arm muscles and hands.  Discussed adding Cogentin for stiffness.  Patient agrees with the plan. ?Principal  Problem: Schizophrenia (HCC) ? ?Diagnosis: Principal Problem: ?  Schizophrenia (HCC) ?Active Problems: ?  Seizure disorder (HCC) ?  MDD (major depressive disorder), recurrent episode, moderate (HCC) ?  Tobacco use disorder ?  Mild alcohol use disorder ? ?Total Time spent with patient:  20 minutes . ?I personally spent 20 minutes on the unit in direct patient care. The direct patient care time included face-to-face time with the patient, reviewing the patient's chart, communicating with other professionals, and coordinating care. Greater than 50% of this time was spent in counseling or coordinating care with the patient regarding goals of hospitalization, psycho-education, and discharge planning needs.  ?Past Psychiatric History: See H&P ? ?Past Medical History:  ?Past Medical History:  ?Diagnosis Date  ? 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 Depression   ? Seizures (HCC)   ?  ?Past Surgical History:  ?Procedure Laterality Date  ? CIRCUMCISION    ? SHOULDER CLOSED REDUCTION Right 12/08/2018  ? Procedure: CLOSED REDUCTION SHOULDER;  Surgeon: Yolonda Kida, MD;  Location: [facility] OR;  Service: Orthopedics;  Laterality: Right;  ? ?Family History:  ?Family History  ?Problem Relation Age of Onset  ? Diabetes gravidarum Paternal Grandmother   ? Heart attack Paternal Grandmother   ? Healthy Mother   ? Healthy Father   ? ?Family Psychiatric  History: See H&P ? ?Social History:  ?Social History  ? ?Substance and Sexual Activity  ?Alcohol Use Yes  ? Alcohol/week: 3.0 standard drinks  ? Types: 3 Cans of beer per week  ? Comment: every night  ?   ?Social History  ? ?  Substance and Sexual Activity  ?Drug Use No  ?  ?Social History  ? ?Socioeconomic History  ? Marital status: Single  ?  Spouse name: Not on file  ? Number of children: 0  ? Years of education: Currently in college  ? Highest education level: Not on file  ?Occupational History  ? Occupation: Consulting civil engineertudent at [employer]  ?Tobacco Use  ? Smoking status: Every  Day  ?  Types: Cigarettes  ? Smokeless tobacco: Never  ?Vaping Use  ? Vaping Use: Every day  ? Substances: Nicotine  ?Substance and Sexual Activity  ? Alcohol use: Yes  ?  Alcohol/week: 3.0 standard drinks  ?  Types: 3 Cans of beer per week  ?  Comment: every night  ? Drug use: No  ? Sexual activity: Never  ?Other Topics Concern  ? Not on file  ?Social History Narrative  ? Rosanne [name] is attending [employer]. 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  ? Living with his father, step-mother, and siblings.  ? Right-handed.  ? He will sometimes drink a soda.  ? ?Social Determinants of Health  ? ?Financial Resource Strain: Not on file  ?Food Insecurity: Not on file  ?Transportation Needs: Not on file  ?Physical Activity: Not on file  ?Stress: Not on file  ?Social Connections: Not on file  ? ?Additional Social History:  ? ?Sleep: Good  ? ?Appetite:  Good ? ?Current Medications: ?Current Facility-Administered Medications  ?Medication Dose Route Frequency Provider Last Rate Last Admin  ? acetaminophen (TYLENOL) tablet 650 mg  650 mg Oral Q6H PRN Jackelyn PolingBerry, Jason A, NP   650 mg at 05/29/21 2048  ? alum & mag hydroxide-simeth (MAALOX/MYLANTA) 200-200-20 MG/5ML suspension 30 mL  30 mL Oral Q4H PRN Nira ConnBerry, Jason A, NP      ? benztropine (COGENTIN) tablet 1 mg  1 mg Oral BID Massengill, Nathan, MD      ? Melene Muller[START ON 06/04/2021] escitalopram (LEXAPRO) tablet 10 mg  10 mg Oral Daily Massengill, Nathan, MD      ? hydrOXYzine (ATARAX) tablet 25 mg  25 mg Oral TID PRN Jackelyn PolingBerry, Jason A, NP   25 mg at 06/02/21 2034  ? lamoTRIgine (LAMICTAL) tablet 50 mg  50 mg Oral QHS Phineas InchesMassengill, Nathan, MD   50 mg at 06/02/21 2035  ? LORazepam (ATIVAN) injection 2 mg  2 mg Intramuscular BID PRN Massengill, Harrold DonathNathan, MD   2 mg at 06/02/21 2025  ? magnesium hydroxide (MILK OF MAGNESIA) suspension 30 mL  30 mL Oral Daily PRN Nira ConnBerry, Jason A, NP      ? melatonin tablet 5 mg  5 mg Oral QHS Nwoko, Agnes I, NP   5 mg at 06/02/21 2035  ? nicotine polacrilex (NICORETTE) gum 2 mg  2 mg Oral  PRN Karsten Rooda, Kellee Sittner, MD      ? OLANZapine zydis (ZYPREXA) disintegrating tablet 5 mg  5 mg Oral Q8H PRN Jackelyn PolingBerry, Jason A, NP   5 mg at 05/31/21 2138  ? And  ? ziprasidone (GEODON) injection 20 mg  20 mg Intramuscular PRN Nira ConnBerry, Jason A, NP      ? vitamin B-12 (CYANOCOBALAMIN) tablet 1,000 mcg  1,000 mcg Oral Daily Karsten Rooda, Pina Sirianni, MD   1,000 mcg at 06/03/21 0814  ? ?Lab Results:  ?No results found for this or any previous visit (from the past 48 hour(s)). ? ? ?Blood Alcohol level:  ?Lab Results  ?Component Value Date  ? ETH <10 05/26/2021  ? ETH 45 (H) 01/31/2021  ? ?Metabolic Disorder  Labs: ?Lab Results  ?Component Value Date  ? HGBA1C 5.4 05/26/2021  ? MPG 108.28 05/26/2021  ? ?No results found for: PROLACTIN ?Lab Results  ?Component Value Date  ? CHOL 152 05/26/2021  ? TRIG 48 05/26/2021  ? HDL 69 05/26/2021  ? CHOLHDL 2.2 05/26/2021  ? VLDL 10 05/26/2021  ? LDLCALC 73 05/26/2021  ? LDLCALC 54 11/13/2014  ? ?Physical Findings: ?AIMS:  , ,  ,  ,    ?CIWA:  CIWA-Ar Total: 0 ?COWS:    ? ?Musculoskeletal: ?Strength & Muscle Tone: within normal limits ?Gait & Station: normal ?Patient leans: N/A ? ?Psychiatric Specialty Exam: ? ?Presentation  ?General Appearance: Appropriate for Environment; Casual ? ?Eye Contact:Poor ? ?Speech:-- (Limited engagement, answers in 1 or 2 words or short sentences) ? ?Speech Volume:Normal ? ?Handedness:Right ? ?Mood and Affect  ?Mood:Euthymic ? ?Affect:Flat ? ?Thought Process  ?Thought Processes:Disorganized ? ?Descriptions of Associations:Intact ? ?Orientation:Full (Time, Place and Person) ? ?Thought Content:Logical ? ?History of Schizophrenia/Schizoaffective disorder:No ? ?Duration of Psychotic Symptoms:Greater than six months ? ?Hallucinations:Hallucinations: None ?Description of Auditory Hallucinations: denies ?Description of Visual Hallucinations: denies ? ?Ideas of Reference:None ? ?Suicidal Thoughts:Suicidal Thoughts: No ? ?Homicidal Thoughts:Homicidal Thoughts: No ? ?Sensorium   ?Memory:Immediate Fair; Recent Poor; Remote Poor ? ?Judgment:Poor ? ?Insight:Shallow ? ?Executive Functions  ?Concentration:Fair ? ?Attention Span:Fair ? ?Recall:Fair ? ?Fund of Knowledge:Fair ? ?Language:Fair ? ?

## 2021-06-03 NOTE — Progress Notes (Signed)
?   06/03/21 0530  ?Sleep  ?Number of Hours 8  ? ? ?

## 2021-06-03 NOTE — BH IP Treatment Plan (Signed)
Interdisciplinary Treatment and Diagnostic Plan Update ? ?06/03/2021 ?Time of Session: 10:05am  ?Stephen [name] ?MRN: [id number] ? ?Principal Diagnosis: Schizophrenia (HCC) ? ?Secondary Diagnoses: Principal Problem: ?  Schizophrenia (HCC) ?Active Problems: ?  Seizure disorder (HCC) ?  MDD (major depressive disorder), recurrent episode, moderate (HCC) ?  Tobacco use disorder ?  Mild alcohol use disorder ? ? ?Current Medications:  ?Current Facility-Administered Medications  ?Medication Dose Route Frequency Provider Last Rate Last Admin  ? acetaminophen (TYLENOL) tablet 650 mg  650 mg Oral Q6H PRN Jackelyn Poling, NP   650 mg at 05/29/21 2048  ? alum & mag hydroxide-simeth (MAALOX/MYLANTA) 200-200-20 MG/5ML suspension 30 mL  30 mL Oral Q4H PRN Nira Conn A, NP      ? benztropine (COGENTIN) tablet 1 mg  1 mg Oral BID Massengill, Nathan, MD      ? Melene Muller ON 06/04/2021] escitalopram (LEXAPRO) tablet 10 mg  10 mg Oral Daily Massengill, Nathan, MD      ? hydrOXYzine (ATARAX) tablet 25 mg  25 mg Oral TID PRN Jackelyn Poling, NP   25 mg at 06/02/21 2034  ? lamoTRIgine (LAMICTAL) tablet 50 mg  50 mg Oral QHS Phineas Inches, MD   50 mg at 06/02/21 2035  ? LORazepam (ATIVAN) injection 2 mg  2 mg Intramuscular BID PRN Massengill, Harrold Donath, MD   2 mg at 06/02/21 2025  ? magnesium hydroxide (MILK OF MAGNESIA) suspension 30 mL  30 mL Oral Daily PRN Nira Conn A, NP      ? melatonin tablet 5 mg  5 mg Oral QHS Nwoko, Agnes I, NP   5 mg at 06/02/21 2035  ? nicotine polacrilex (NICORETTE) gum 2 mg  2 mg Oral PRN Karsten Ro, MD      ? OLANZapine zydis (ZYPREXA) disintegrating tablet 5 mg  5 mg Oral Q8H PRN Jackelyn Poling, NP   5 mg at 05/31/21 2138  ? And  ? ziprasidone (GEODON) injection 20 mg  20 mg Intramuscular PRN Nira Conn A, NP      ? vitamin B-12 (CYANOCOBALAMIN) tablet 1,000 mcg  1,000 mcg Oral Daily Karsten Ro, MD   1,000 mcg at 06/03/21 0814  ? ?PTA Medications: ?Medications Prior to Admission  ?Medication Sig  Dispense Refill Last Dose  ? lamoTRIgine (LAMICTAL) 100 MG tablet Take 1/2 tablet every night (Patient taking differently: Take 50 mg by mouth at bedtime.) 45 tablet 3   ? traZODone (DESYREL) 50 MG tablet Take 1 tablet (50 mg total) by mouth at bedtime. 30 tablet 11   ? ? ?Patient Stressors: Medication change or noncompliance   ? ?Patient Strengths: Communication skills  ?Supportive family/friends  ? ?Treatment Modalities: Medication Management, Group therapy, Case management,  ?1 to 1 session with clinician, Psychoeducation, Recreational therapy. ? ? ?Physician Treatment Plan for Primary Diagnosis: Schizophrenia (HCC) ?Long Term Goal(s): Improvement in symptoms so as ready for discharge  ? ?Short Term Goals: Ability to identify and develop effective coping behaviors will improve ?Compliance with prescribed medications will improve ?Ability to identify triggers associated with substance abuse/mental health issues will improve ?Ability to identify changes in lifestyle to reduce recurrence of condition will improve ?Ability to verbalize feelings will improve ?Ability to disclose and discuss suicidal ideas ?Ability to demonstrate self-control will improve ? ?Medication Management: Evaluate patient's response, side effects, and tolerance of medication regimen. ? ?Therapeutic Interventions: 1 to 1 sessions, Unit Group sessions and Medication administration. ? ?Evaluation of Outcomes: Progressing ? ?Physician Treatment Plan for Secondary  Diagnosis: Principal Problem: ?  Schizophrenia (HCC) ?Active Problems: ?  Seizure disorder (HCC) ?  MDD (major depressive disorder), recurrent episode, moderate (HCC) ?  Tobacco use disorder ?  Mild alcohol use disorder ? ?Long Term Goal(s): Improvement in symptoms so as ready for discharge  ? ?Short Term Goals: Ability to identify and develop effective coping behaviors will improve ?Compliance with prescribed medications will improve ?Ability to identify triggers associated with  substance abuse/mental health issues will improve ?Ability to identify changes in lifestyle to reduce recurrence of condition will improve ?Ability to verbalize feelings will improve ?Ability to disclose and discuss suicidal ideas ?Ability to demonstrate self-control will improve    ? ?Medication Management: Evaluate patient's response, side effects, and tolerance of medication regimen. ? ?Therapeutic Interventions: 1 to 1 sessions, Unit Group sessions and Medication administration. ? ?Evaluation of Outcomes: Progressing ? ? ?RN Treatment Plan for Primary Diagnosis: Schizophrenia (HCC) ?Long Term Goal(s): Knowledge of disease and therapeutic regimen to maintain health will improve ? ?Short Term Goals: Ability to remain free from injury will improve, Ability to participate in decision making will improve, Ability to verbalize feelings will improve, Ability to disclose and discuss suicidal ideas, and Ability to identify and develop effective coping behaviors will improve ? ?Medication Management: RN will administer medications as ordered by provider, will assess and evaluate patient's response and provide education to patient for prescribed medication. RN will report any adverse and/or side effects to prescribing provider. ? ?Therapeutic Interventions: 1 on 1 counseling sessions, Psychoeducation, Medication administration, Evaluate responses to treatment, Monitor vital signs and CBGs as ordered, Perform/monitor CIWA, COWS, AIMS and Fall Risk screenings as ordered, Perform wound care treatments as ordered. ? ?Evaluation of Outcomes: Progressing ? ? ?LCSW Treatment Plan for Primary Diagnosis: Schizophrenia (HCC) ?Long Term Goal(s): Safe transition to appropriate next level of care at discharge, Engage patient in therapeutic group addressing interpersonal concerns. ? ?Short Term Goals: Engage patient in aftercare planning with referrals and resources, Increase social support, Increase emotional regulation, Facilitate  acceptance of mental health diagnosis and concerns, Identify triggers associated with mental health/substance abuse issues, and Increase skills for wellness and recovery ? ?Therapeutic Interventions: Assess for all discharge needs, 1 to 1 time with Child psychotherapist, Explore available resources and support systems, Assess for adequacy in community support network, Educate family and significant other(s) on suicide prevention, Complete Psychosocial Assessment, Interpersonal group therapy. ? ?Evaluation of Outcomes: Progressing ? ? ?Progress in Treatment: ?Attending groups: Yes. ?Participating in groups: Yes. ?Taking medication as prescribed: Yes. ?Toleration medication: Yes. ?Family/Significant other contact made: Yes, individual(s) contacted:  father, Karie Mainland ?Patient understands diagnosis: No. ?Discussing patient identified problems/goals with staff: Yes. ?Medical problems stabilized or resolved: Yes. ?Denies suicidal/homicidal ideation: Yes. ?Issues/concerns per patient self-inventory: No. ?Other: none ?  ?New problem(s) identified: No, Describe:  none reported ?  ?New Short Term/Long Term Goal(s):  ?  ?medication stabilization, elimination of SI thoughts, development of comprehensive mental wellness plan.  ?  ?  ?Patient Goals:  Patient states he would like to be clean from cigarettes and alcohol. ?  ?Discharge Plan or Barriers: Patient recently admitted. CSW will continue to follow and assess for appropriate referrals and possible discharge planning.  ?  ?  ?Reason for Continuation of Hospitalization: Delusions  ?Hallucinations ?Medication stabilization ?Withdrawal symptoms ?Other; describe psychosis ?  ?Estimated Length of Stay: 3-5 days ? ?Last 3 [place] Suicide Severity Risk Score: ?Flowsheet Row [facility] from 01/31/2021 in [facility]  ?C-SSRS RISK  CATEGORY No Risk High Risk No Risk  ? ?  ? ? ?Last PHQ 2/9 Scores: ? ?  05/26/2021  ?  3:17 PM 02/20/2020  ?  3:11 PM 02/20/2020  ?  2:20 PM  ?Depression screen PHQ 2/9  ?Decreased Interest 1 0 1  ?Down, Depressed, Hopeless

## 2021-06-03 NOTE — Group Note (Signed)
Recreation Therapy Group Note ? ? ?Group Topic:Communication  ?Group Date: 06/03/2021 ?Start Time: 1000 ?End Time: 1035 ?Facilitators: Caroll Rancher, LRT,CTRS ?Location: [address] Dayroom ? ? ?oal Area(s) Addresses:  ?Patient will effectively listen to complete activity.  ?Patient will identify communication skills used to make activity successful.  ?Patient will identify how skills used during activity can be used to reach post d/c goals.  ?  ?Group Description:  Geometric Drawings.  Three volunteers from the peer group will be shown an abstract picture with a particular arrangement of geometrical shapes.  Each round, one 'speaker' will describe the pattern, as accurately as possible without revealing the image to the group.  The remaining group members will listen and draw the picture to reflect how it is described to them. Patients with the role of 'listener' cannot ask clarifying questions but, may request that the speaker repeat a direction. Once the drawings are complete, the presenter will show the rest of the group the picture and compare how close each person came to drawing the picture. LRT will facilitate a post-activity discussion regarding effective communication and the importance of planning, listening, and asking for clarification in daily interactions with others. ? ? ?Affect/Mood: N/A ?  ?Participation Level: Did not attend ?  ? ?Clinical Observations/Individualized Feedback:   ? ? ?Plan: Continue to engage patient in RT group sessions 2-3x/week. ? ? ?Caroll Rancher, LRT,CTRS ?06/03/2021 1:48 PM ?

## 2021-06-03 NOTE — BHH Counselor (Signed)
Earliest time patient can be accepted to rehab is 06/08/2021.  In order to be accepted patient needs to be on seizure medication and needs to be stabilized for a week. CSW notified providers.  ? ? ?Tiyana Galla, LCSW, LCAS ?Clincal Social Worker  ?[facility] ? ?

## 2021-06-04 LAB — CK: Total CK: 447 U/L — ABNORMAL HIGH (ref 49–397)

## 2021-06-04 MED ORDER — BENZTROPINE MESYLATE 1 MG PO TABS
1.0000 mg | ORAL_TABLET | Freq: Three times a day (TID) | ORAL | Status: DC
Start: 2021-06-05 — End: 2021-06-04
  Filled 2021-06-04 (×4): qty 1

## 2021-06-04 MED ORDER — AMANTADINE HCL 100 MG PO CAPS
100.0000 mg | ORAL_CAPSULE | Freq: Two times a day (BID) | ORAL | Status: DC
  Administered 2021-06-04 – 2021-06-10 (×13): 100 mg via ORAL
  Filled 2021-06-04: qty 1
  Filled 2021-06-04: qty 28
  Filled 2021-06-04 (×11): qty 1
  Filled 2021-06-04: qty 28
  Filled 2021-06-04 (×3): qty 1

## 2021-06-04 MED ORDER — BENZTROPINE MESYLATE 1 MG PO TABS
1.0000 mg | ORAL_TABLET | Freq: Two times a day (BID) | ORAL | Status: DC
  Administered 2021-06-04 – 2021-06-05 (×3): 1 mg via ORAL
  Filled 2021-06-04 (×5): qty 1

## 2021-06-04 NOTE — BHH Group Notes (Signed)
Pt did not attend wrap up group this evening. Pt was in their room resting.  

## 2021-06-04 NOTE — Progress Notes (Signed)
?   06/04/21 0530  ?Sleep  ?Number of Hours 1.75  ? ? ?

## 2021-06-04 NOTE — Group Note (Signed)
Recreation Therapy Group Note ? ? ?Group Topic:Team Building  ?Group Date: 06/04/2021 ?Start Time: 1000 ?End Time: 1040 ?Facilitators: Caroll Rancher, LRT,CTRS ?Location: [address] Dayroom ? ? ?Goal Area(s) Addresses:  ?Patient will effectively work with peer towards shared goal.  ?Patient will identify skills used to make activity successful.  ?Patient will identify how skills used during activity can be used to reach post d/c goals.  ? ?Group Description:  Landing Pad. In teams of 3-5, patients were given 12 plastic drinking straws and an equal length of masking tape. Using the materials provided, patients were asked to build a landing pad to catch a golf ball dropped from approximately 5 feet in the air. All materials were required to be used by the team in their design. LRT facilitated post-activity discussion. ? ? ?Affect/Mood: Appropriate ?  ?Participation Level: Engaged ?  ?Participation Quality: Independent ?  ?Behavior: Appropriate ?  ?Speech/Thought Process: Focused ?  ?Insight: Good ?  ?Judgement: Good ?  ?Modes of Intervention: STEM Activity ?  ?Patient Response to Interventions:  Engaged ?  ?Education Outcome: ? Acknowledges education and In group clarification offered   ? ?Clinical Observations/Individualized Feedback: Pt came a little late to group.  Once instructions were explained to pt, he worked well with peer in coming up with a concept for their landing pad.  Pt and peer were able to complete the task.  During processing, pt had a hard time putting his thoughts into words.  Pt expressed with his support system, he would have to plan things out and work the steps.  Pt expressed he would start over with the plan if needed or move on the plan B.   ?  ? ?Plan: Continue to engage patient in RT group sessions 2-3x/week. ? ? ?Caroll Rancher, LRT,CTRS ?06/04/2021 12:15 PM ?

## 2021-06-04 NOTE — Progress Notes (Signed)
Pt gave 1st of 24 hr urine sample 630, pt educated on collection methods ?

## 2021-06-04 NOTE — BHH Group Notes (Signed)
BHH Group Notes:  (Nursing/MHT/Case Management/Adjunct) ? ?Date:  06/04/2021  ?Time:  1:15 PM ? ?Type of Therapy:   Orientation/Goals group ? ?Participation Level:  Active ? ?Participation Quality:  Appropriate ? ?Affect:  Appropriate ? ?Cognitive:  Appropriate ? ?Insight:  Appropriate ? ?Engagement in Group:  Engaged and Improving ? ?Modes of Intervention:  Discussion, Education, Orientation, and Support ? ?Summary of Progress/Problems: Pt goal for today is to work on nicotine and alcohol addiction.  ? ?Ihor Austin Nonna Renninger ?06/04/2021, 1:15 PM ?

## 2021-06-04 NOTE — Plan of Care (Signed)
  Problem: Activity: Goal: Interest or engagement in activities will improve Outcome: Progressing   Problem: Coping: Goal: Ability to verbalize frustrations and anger appropriately will improve Outcome: Progressing   Problem: Coping: Goal: Ability to demonstrate self-control will improve Outcome: Progressing   Problem: Safety: Goal: Periods of time without injury will increase Outcome: Progressing   

## 2021-06-04 NOTE — Progress Notes (Signed)
Pt appeared confused this evening, pt observed in the room searching for lighter and his vape pen under the bed. Pt also stated he was looking for his iphone. Pt's mom visited during visitation and was concerned about the changes in patient status. Pt took his bedtime meds with ease, will continue to monitor. ?

## 2021-06-04 NOTE — Progress Notes (Signed)
Pt keeps waking up requesting more medication for sleep, pt informed he has had enough medication since he took the PRN 2 mg Ativan with his HS medication. Pt goes to sleep if he lays down , but will get up after short nap at times and has to be redirected to lay back down ?

## 2021-06-04 NOTE — Progress Notes (Signed)
[facility] MD Progress Note ? ?06/04/2021 1:38 PM ?Stephen [name]  ?MRN:  [id number] ?Subjective:   26 year old AA male with prior hx of depression, generalized anxiety disorder & oppositional disorder. He does have hx of seizure disorder as well. He was brought to the [facility] from the [facility] with complain of suicidal ideations with plans to buy a gun from online to kill himself. Chart review indicated that indicated that he was [name] having homicidal ideations towards his family (parents & siblings) because of an argument. Stephen [name] was a patient in this [facility] on the adolescent's unit in 2016. His diagnosis then was oppositional defiant disorder. After the evaluation at the [facility], he was transferred to the [facility] for further psychiatric evaluation/treatments. The care everywhere reports indicated patient has been on Citalopram & Lamictal in the remote past. His currently under IVC petition. ? ?On assessment today patient reports that his mood is "happy." Patient reports that he just learned about how to overcome mistakes in group and he enjoyed the group. Patient reports that he is eating very well and likes the food, but he is not sleeping well. Patient reports that he recalls having frequent night time awakening last night and endorses that he believes it may be due to him "worrying" a lot. Patient reports he was worried about finding a job. Patient reports that he feels like his thoughts are racing and he has a lot on his mind. Patient denies SI, HI, AVH, paranoia, beliefs in thought manipulation, or beliefs that he is receiving special messages.  Patient endorses that he had a BM yesterday, that was fairly loose.  ? ? ?Patient continues to have significant BUE rigidity and is noted to have a stiff appearance while walking.   ? ?Principal Problem: Schizophrenia (HCC) ?Diagnosis: Principal Problem: ?  Schizophrenia (HCC) ?Active Problems: ?  Seizure disorder (HCC) ?  MDD (major depressive disorder), recurrent episode, moderate (HCC) ?   Tobacco use disorder ?  Mild alcohol use disorder ? ?Total Time spent with patient: 20 minutes ? ?Past Psychiatric History: See H&P ? ?Past Medical History:  ?Past Medical History:  ?Diagnosis Date  ? 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 Depression   ? Seizures (HCC)   ?  ?Past Surgical History:  ?Procedure Laterality Date  ? CIRCUMCISION    ? SHOULDER CLOSED REDUCTION Right 12/08/2018  ? Procedure: CLOSED REDUCTION SHOULDER;  Surgeon: Yolonda Kida, MD;  Location: [facility] OR;  Service: Orthopedics;  Laterality: Right;  ? ?Family History:  ?Family History  ?Problem Relation Age of Onset  ? Diabetes gravidarum Paternal Grandmother   ? Heart attack Paternal Grandmother   ? Healthy Mother   ? Healthy Father   ? ?Family Psychiatric  History: See H&P ?Social History:  ?Social History  ? ?Substance and Sexual Activity  ?Alcohol Use Yes  ? Alcohol/week: 3.0 standard drinks  ? Types: 3 Cans of beer per week  ? Comment: every night  ?   ?Social History  ? ?Substance and Sexual Activity  ?Drug Use No  ?  ?Social History  ? ?Socioeconomic History  ? Marital status: Single  ?  Spouse name: Not on file  ? Number of children: 0  ? Years of education: Currently in college  ? Highest education level: Not on file  ?Occupational History  ? Occupation: Consulting civil engineer at [employer]  ?Tobacco Use  ? Smoking status: Every Day  ?  Types: Cigarettes  ? Smokeless tobacco: Never  ?Vaping Use  ?  Vaping Use: Every day  ? Substances: Nicotine  ?Substance and Sexual Activity  ? Alcohol use: Yes  ?  Alcohol/week: 3.0 standard drinks  ?  Types: 3 Cans of beer per week  ?  Comment: every night  ? Drug use: No  ? Sexual activity: Never  ?Other Topics Concern  ? Not on file  ?Social History Narrative  ? Stephen [name] is attending [employer]. He is Glass blower/designer in Primary school teacher.  ? Living with his father, step-mother, and siblings.  ? Right-handed.  ? He will sometimes drink a soda.  ? ?Social Determinants of Health  ? ?Financial Resource Strain: Not on  file  ?Food Insecurity: Not on file  ?Transportation Needs: Not on file  ?Physical Activity: Not on file  ?Stress: Not on file  ?Social Connections: Not on file  ? ?Additional Social History:  ?  ?  ?  ?  ?  ?  ?  ?  ?  ?  ?  ? ?Sleep: Poor ? ?Appetite:  Good ? ?Current Medications: ?Current Facility-Administered Medications  ?Medication Dose Route Frequency Provider Last Rate Last Admin  ? acetaminophen (TYLENOL) tablet 650 mg  650 mg Oral Q6H PRN Jackelyn Poling, NP   650 mg at 05/29/21 2048  ? alum & mag hydroxide-simeth (MAALOX/MYLANTA) 200-200-20 MG/5ML suspension 30 mL  30 mL Oral Q4H PRN Nira Conn A, NP      ? amantadine (SYMMETREL) capsule 100 mg  100 mg Oral Q12H Massengill, Harrold Donath, MD   100 mg at 06/04/21 1201  ? [START ON 06/05/2021] benztropine (COGENTIN) tablet 1 mg  1 mg Oral TID Eliseo Gum B, MD      ? benztropine (COGENTIN) tablet 1 mg  1 mg Oral BID Eliseo Gum B, MD      ? escitalopram (LEXAPRO) tablet 10 mg  10 mg Oral Daily Massengill, Nathan, MD   10 mg at 06/04/21 6270  ? hydrOXYzine (ATARAX) tablet 25 mg  25 mg Oral TID PRN Jackelyn Poling, NP   25 mg at 06/02/21 2034  ? lamoTRIgine (LAMICTAL) tablet 50 mg  50 mg Oral QHS Phineas Inches, MD   50 mg at 06/03/21 2114  ? LORazepam (ATIVAN) injection 2 mg  2 mg Intramuscular BID PRN Massengill, Harrold Donath, MD   2 mg at 06/02/21 2025  ? magnesium hydroxide (MILK OF MAGNESIA) suspension 30 mL  30 mL Oral Daily PRN Nira Conn A, NP      ? melatonin tablet 5 mg  5 mg Oral QHS Nwoko, Agnes I, NP   5 mg at 06/03/21 2114  ? nicotine polacrilex (NICORETTE) gum 2 mg  2 mg Oral PRN Karsten Ro, MD   2 mg at 06/04/21 0802  ? OLANZapine zydis (ZYPREXA) disintegrating tablet 5 mg  5 mg Oral Q8H PRN Nira Conn A, NP   5 mg at 05/31/21 2138  ? And  ? ziprasidone (GEODON) injection 20 mg  20 mg Intramuscular PRN Nira Conn A, NP      ? vitamin B-12 (CYANOCOBALAMIN) tablet 1,000 mcg  1,000 mcg Oral Daily Karsten Ro, MD   1,000 mcg at 06/04/21 3500   ? ? ?Lab Results:  ?Results for orders placed or performed during the hospital encounter of 05/28/21 (from the past 48 hour(s))  ?CK     Status: None  ? Collection Time: 06/03/21  6:21 PM  ?Result Value Ref Range  ? Total CK 362 49 - 397 U/L  ?  Comment: Performed at [facility], [address] [place], [place] [address]  ?CBC     Status: Abnormal  ? Collection Time: 06/03/21  6:21 PM  ?Result Value Ref Range  ? WBC 3.9 (L) 4.0 - 10.5 K/uL  ? RBC 4.50 4.22 - 5.81 MIL/uL  ? Hemoglobin 13.9 13.0 - 17.0 g/dL  ? HCT 41.1 39.0 - 52.0 %  ? MCV 91.3 80.0 - 100.0 fL  ? MCH 30.9 26.0 - 34.0 pg  ? MCHC 33.8 30.0 - 36.0 g/dL  ? RDW 13.0 11.5 - 15.5 %  ? Platelets 246 150 - 400 K/uL  ? nRBC 0.0 0.0 - 0.2 %  ?  Comment: Performed at [facility], [address] [place], [place] [address]  ?Comprehensive metabolic panel     Status: Abnormal  ? Collection Time: 06/03/21  6:21 PM  ?Result Value Ref Range  ? Sodium 137 135 - 145 mmol/L  ? Potassium 3.7 3.5 - 5.1 mmol/L  ? Chloride 103 98 - 111 mmol/L  ? CO2 27 22 - 32 mmol/L  ? Glucose, Bld 144 (H) 70 - 99 mg/dL  ?  Comment: Glucose reference range applies only to samples taken after fasting for at least 8 hours.  ? BUN 8 6 - 20 mg/dL  ? Creatinine, Ser 0.84 0.61 - 1.24 mg/dL  ? Calcium 9.0 8.9 - 10.3 mg/dL  ? Total Protein 7.8 6.5 - 8.1 g/dL  ? Albumin 4.3 3.5 - 5.0 g/dL  ? AST 23 15 - 41 U/L  ? ALT 15 0 - 44 U/L  ? Alkaline Phosphatase 70 38 - 126 U/L  ? Total Bilirubin 0.4 0.3 - 1.2 mg/dL  ? GFR, Estimated >60 >60 mL/min  ?  Comment: (NOTE) ?Calculated using the CKD-EPI Creatinine Equation (2021) ?  ? Anion gap 7 5 - 15  ?  Comment: Performed at [facility], [address] [place], [place] [address]  ? ? ?Blood Alcohol level:  ?Lab Results  ?Component Value Date  ? ETH <10 05/26/2021  ? ETH 45 (H) 01/31/2021  ? ? ?Metabolic Disorder Labs: ?Lab Results  ?Component Value Date  ? HGBA1C 5.4 05/26/2021  ? MPG 108.28  05/26/2021  ? ?No results found for: PROLACTIN ?Lab Results  ?Component Value Date  ? CHOL 152 05/26/2021  ? TRIG 48 05/26/2021  ? HDL 69 05/26/2021  ? CHOLHDL 2.2 05/26/2021  ? VLDL 10 05/26/2021  ? LDLCALC

## 2021-06-04 NOTE — Progress Notes (Signed)
?   06/04/21 1039  ?Psych Admission Type (Psych Patients Only)  ?Admission Status Involuntary  ?Psychosocial Assessment  ?Patient Complaints Anxiety  ?Eye Contact Fair  ?Facial Expression Flat  ?Affect Appropriate to circumstance  ?Speech Logical/coherent  ?Interaction Assertive  ?Motor Activity Other (Comment) ?(WNL)  ?Appearance/Hygiene Unremarkable  ?Behavior Characteristics Cooperative;Calm  ?Mood Anxious;Pleasant  ?Thought Process  ?Coherency Concrete thinking  ?Content Preoccupation  ?Delusions Paranoid  ?Perception WDL  ?Hallucination None reported or observed  ?Judgment Limited  ?Confusion WDL  ?Danger to Self  ?Current suicidal ideation? Denies  ?Danger to Others  ?Danger to Others None reported or observed  ? ? ?

## 2021-06-04 NOTE — Hospital Course (Addendum)
Collateral, spoke with mom: ? ?Provider spoke with mom about changes in patient regimen and recent changes she has seen. Mom endorses she is concerned about patient and she is started to plan to get patient to see a doctor in [place] with her family, where she has more support. Mom reports that her family has connections in [place] and she needs their support.  ? ? ? ?

## 2021-06-05 LAB — CK: Total CK: 418 U/L — ABNORMAL HIGH (ref 49–397)

## 2021-06-05 MED ORDER — WHITE PETROLATUM EX OINT
TOPICAL_OINTMENT | CUTANEOUS | Status: AC
  Filled 2021-06-05: qty 5

## 2021-06-05 MED ORDER — OLANZAPINE 2.5 MG PO TABS
2.5000 mg | ORAL_TABLET | Freq: Two times a day (BID) | ORAL | Status: DC
  Administered 2021-06-05 – 2021-06-10 (×11): 2.5 mg via ORAL
  Filled 2021-06-05 (×3): qty 1
  Filled 2021-06-05 (×2): qty 28
  Filled 2021-06-05 (×12): qty 1

## 2021-06-05 MED ORDER — BENZTROPINE MESYLATE 1 MG PO TABS
1.0000 mg | ORAL_TABLET | Freq: Three times a day (TID) | ORAL | Status: DC
  Administered 2021-06-05 – 2021-06-10 (×15): 1 mg via ORAL
  Filled 2021-06-05 (×6): qty 1
  Filled 2021-06-05: qty 42
  Filled 2021-06-05 (×9): qty 1
  Filled 2021-06-05: qty 42
  Filled 2021-06-05 (×4): qty 1
  Filled 2021-06-05: qty 42

## 2021-06-05 NOTE — Progress Notes (Signed)
Pt denies SI/HI/VH but endorses AH and verbally agrees to approach staff if these become apparent or before harming themselves/others. Rates depression 8/10. Rates anxiety 8/10. Rates pain 0/10. Pt has seemed to be quite anxious throughout the day. Pt came up asking for ativan but was redirected. Pt has been in and out of the day room and has been going to groups. Pt has been seen pacing throughout the day. Pt will laugh and smile at random times. Pt will stumble with his words when he speaks and seems to have some thought blocking also. Scheduled medications administered to pt, per MD orders. RN provided support and encouragement to pt. Q15 min safety checks implemented and continued. Pt safe on the unit. RN will continue to monitor and intervene as needed.  ? 06/05/21 0802  ?Psych Admission Type (Psych Patients Only)  ?Admission Status Involuntary  ?Psychosocial Assessment  ?Patient Complaints Anxiety;Depression  ?Eye Contact Fair  ?Facial Expression Blank;Flat  ?Affect Flat;Anxious  ?Speech Soft;Logical/coherent  ?Interaction Forwards little;Guarded;Minimal  ?Motor Activity Pacing;Restless  ?Appearance/Hygiene Unremarkable  ?Behavior Characteristics Cooperative;Anxious  ?Mood Anxious;Depressed;Sad  ?Thought Process  ?Coherency Blocking  ?Content Preoccupation  ?Delusions None reported or observed  ?Perception Derealization;Hallucinations  ?Hallucination Auditory  ?Judgment Impaired  ?Confusion Moderate  ?Danger to Self  ?Current suicidal ideation? Denies  ?Danger to Others  ?Danger to Others None reported or observed  ? ? ?

## 2021-06-05 NOTE — [facility] Group Notes (Signed)
Spirituality group facilitated by Kathrynn Humble, [place].  ? ?Group Description: Group focused on topic of hope. Patients participated in facilitated discussion around topic, connecting with one another around experiences and definitions for hope. Group members engaged with visual explorer photos, reflecting on what hope looks like for them today. Group engaged in discussion around how their definitions of hope are present today in hospital.  ? ?Modalities: Psycho-social ed, Adlerian, Narrative, MI  ? ?Patient Progress: Stephen [name] attended group and was engaged in the conversation.  Stephen [name] shared about his parents giving him hope. Stephen [name] was respectful of peers. ? ?[employer], [employer] ?PAger, [phone] ?

## 2021-06-05 NOTE — [facility] Group Notes (Signed)
Adult Psychoeducational Group Note ? ?Date:  06/05/2021 ?Time:  8:49 PM ? ?Group Topic/Focus:  ?Wrap-Up Group:   The focus of this group is to help patients review their daily goal of treatment and discuss progress on daily workbooks. ? ?Participation Level:  Minimal ? ?Participation Quality:  Inattentive ? ?Affect:  Flat ? ?Cognitive:  Alert ? ?Insight: Lacking ? ?Engagement in Group:  Supportive ? ?Modes of Intervention:  Clarification, Discussion, and Education ? ?Additional Comments:  Pt attended and participated in wrap up group this evening and rated their day an 8/10, due to them going to groups and outside. Pt goal is to work on their nicotine and alcohol addiction. Pt plan is to receive resources on AA and NA meetings in their area. Pt would like to maybe join an AA or NA meeting on the [address] hall.  ? ?Chrisandra Netters ?06/05/2021, 8:49 PM ?

## 2021-06-05 NOTE — [facility] Group Notes (Signed)
[facility] Group Notes:  (Nursing/MHT/Case Management/Adjunct) ? ?Date:  06/05/2021  ?Time:  10:41 AM ? ?Type of Therapy:   Orientation/Goals group ? ?Participation Level:  Active ? ?Participation Quality:  Drowsy and Inattentive ? ?Affect:  Anxious and Lethargic ? ?Cognitive:  Disorganized and Confused ? ?Insight:  Lacking ? ?Engagement in Group:  Improving and Lacking ? ?Modes of Intervention:  Discussion, Education, Orientation, and Support ? ?Summary of Progress/Problems: Pt goal for today is to continue to work on his nicotine addiction.  ? ?Stephen [name] ?06/05/2021, 10:41 AM ?

## 2021-06-05 NOTE — Group Note (Signed)
LCSW Group Therapy Note ? ? ?Group Date: 06/05/2021 ?Start Time: 1100 ?End Time: 1200 ? ? ? ?Type of Therapy and Topic:  Group Therapy:  Setting Goals ? ?Participation Level:  Minimal ? ?Description of Group: ?In this process group, patients discussed using strengths to work toward goals and address challenges.  Patients identified two positive things about themselves and one goal they were working on.  Patients were given the opportunity to share openly and support each other?s plan for self-empowerment.  The group discussed the value of gratitude and were encouraged to have a daily reflection of positive characteristics or circumstances.  Patients were encouraged to identify a plan to utilize their strengths to work on current challenges and goals. ? ?Therapeutic Goals ?Patient will verbalize personal strengths/positive qualities and relate how these can assist with achieving desired personal goals ?Patients will verbalize affirmation of peers plans for personal change and goal setting ?Patients will explore the value of gratitude and positive focus as related to successful achievement of goals ?Patients will verbalize a plan for regular reinforcement of personal positive qualities and circumstances. ? ?Summary of Patient Progress:  Pt came to group and shared that getting pay stubs was a goal of his. Pt struggled to set smaller tasks to help him reach this goal ? ? ?Therapeutic Modalities ?Cognitive Behavioral Therapy ?Motivational Interviewing ? ? ? ? ?Darletta Moll MSW, LCSW ?Clincal Social Worker  ?[facility]  ?

## 2021-06-05 NOTE — Progress Notes (Signed)
[facility] MD Progress Note ? ?06/05/2021 4:11 PM ?Stephen [name]  ?MRN:  [id number] ?Subjective:  26 year old AA male with prior hx of depression, generalized anxiety disorder & oppositional disorder. He does have hx of seizure disorder as well. He was brought to the [facility] from the [facility] with complain of suicidal ideations with plans to buy a gun from online to kill himself. Chart review indicated that indicated that he was [name] having homicidal ideations towards his family (parents & siblings) because of an argument. Stephen [name] was a patient in this [facility] on the adolescent's unit in 2016. His diagnosis then was oppositional defiant disorder. After the evaluation at the [facility], he was transferred to the [facility] for further psychiatric evaluation/treatments. The care everywhere reports indicated patient has been on Citalopram & Lamictal in the remote past. His currently under IVC petition. ? ?RN reports patient was very confused last night during visitation and mom was tearful as a result. RN notes that patient ask multiple times about Ativan in short time periods.  ? ?On assessment this AM patient reports that he sleep ok and is eating fairly well. Patient reports that his mother did come visit last night, and he feels that the visit was positive and they talked about family. Patient reports that he is not having SI HI or AH. Patient reports that he did have VH and this has been bothering him a bit. Patient reports that he is seeing things out the corner of his eyes when he is outside during outside time and when he is off the unit for meals. Patient denies feeling that his thoughts are racing or paranoid.  ? ? ?Collateral, spoke with mom: ? ?Provider spoke with mom about changes in patient regimen and recent changes she has seen. Mom endorses she is concerned about patient and she is started to plan to get patient to see a doctor in [place] with her family, where she has more support. Mom reports that her family has connections in [place]  and she needs their support.  ? ?Principal Problem: Schizophrenia (HCC) ?Diagnosis: Principal Problem: ?  Schizophrenia (HCC) ?Active Problems: ?  Seizure disorder (HCC) ?  MDD (major depressive disorder), recurrent episode, moderate (HCC) ?  Tobacco use disorder ?  Mild alcohol use disorder ? ?Total Time spent with patient: 30 minutes ? ?Past Psychiatric History: See H&P ? ?Past Medical History:  ?Past Medical History:  ?Diagnosis Date  ? 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 Depression   ? Seizures (HCC)   ?  ?Past Surgical History:  ?Procedure Laterality Date  ? CIRCUMCISION    ? SHOULDER CLOSED REDUCTION Right 12/08/2018  ? Procedure: CLOSED REDUCTION SHOULDER;  Surgeon: Yolonda Kida, MD;  Location: [facility] OR;  Service: Orthopedics;  Laterality: Right;  ? ?Family History:  ?Family History  ?Problem Relation Age of Onset  ? Diabetes gravidarum Paternal Grandmother   ? Heart attack Paternal Grandmother   ? Healthy Mother   ? Healthy Father   ? ?Family Psychiatric  History: See H&&P ?Social History:  ?Social History  ? ?Substance and Sexual Activity  ?Alcohol Use Yes  ? Alcohol/week: 3.0 standard drinks  ? Types: 3 Cans of beer per week  ? Comment: every night  ?   ?Social History  ? ?Substance and Sexual Activity  ?Drug Use No  ?  ?Social History  ? ?Socioeconomic History  ? Marital status: Single  ?  Spouse name: Not on file  ? Number of children:  0  ? Years of education: Currently in college  ? Highest education level: Not on file  ?Occupational History  ? Occupation: Consulting civil engineertudent at [employer]  ?Tobacco Use  ? Smoking status: Every Day  ?  Types: Cigarettes  ? Smokeless tobacco: Never  ?Vaping Use  ? Vaping Use: Every day  ? Substances: Nicotine  ?Substance and Sexual Activity  ? Alcohol use: Yes  ?  Alcohol/week: 3.0 standard drinks  ?  Types: 3 Cans of beer per week  ?  Comment: every night  ? Drug use: No  ? Sexual activity: Never  ?Other Topics Concern  ? Not on file  ?Social History Narrative   ? Stephen [name] is attending [employer]. 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  ? Living with his father, step-mother, and siblings.  ? Right-handed.  ? He will sometimes drink a soda.  ? ?Social Determinants of Health  ? ?Financial Resource Strain: Not on file  ?Food Insecurity: Not on file  ?Transportation Needs: Not on file  ?Physical Activity: Not on file  ?Stress: Not on file  ?Social Connections: Not on file  ? ?Additional Social History:  ?  ?  ?  ?  ?  ?  ?  ?  ?  ?  ?  ? ?Sleep: Good ? ?Appetite:  Good ? ?Current Medications: ?Current Facility-Administered Medications  ?Medication Dose Route Frequency Provider Last Rate Last Admin  ? acetaminophen (TYLENOL) tablet 650 mg  650 mg Oral Q6H PRN Jackelyn PolingBerry, Jason A, NP   650 mg at 05/29/21 2048  ? alum & mag hydroxide-simeth (MAALOX/MYLANTA) 200-200-20 MG/5ML suspension 30 mL  30 mL Oral Q4H PRN Nira ConnBerry, Jason A, NP      ? amantadine (SYMMETREL) capsule 100 mg  100 mg Oral Q12H Massengill, Harrold DonathNathan, MD   100 mg at 06/05/21 0801  ? benztropine (COGENTIN) tablet 1 mg  1 mg Oral TID AC Eliseo GumMcQuilla, Shaunette Gassner B, MD   1 mg at 06/05/21 1140  ? escitalopram (LEXAPRO) tablet 10 mg  10 mg Oral Daily Massengill, Nathan, MD   10 mg at 06/05/21 [id number]  ? hydrOXYzine (ATARAX) tablet 25 mg  25 mg Oral TID PRN Jackelyn PolingBerry, Jason A, NP   25 mg at 06/05/21 0802  ? lamoTRIgine (LAMICTAL) tablet 50 mg  50 mg Oral QHS Massengill, Harrold DonathNathan, MD   50 mg at 06/04/21 2039  ? magnesium hydroxide (MILK OF MAGNESIA) suspension 30 mL  30 mL Oral Daily PRN Nira ConnBerry, Jason A, NP      ? melatonin tablet 5 mg  5 mg Oral QHS Nwoko, Agnes I, NP   5 mg at 06/04/21 2040  ? nicotine polacrilex (NICORETTE) gum 2 mg  2 mg Oral PRN Karsten Rooda, Vandana, MD   2 mg at 06/04/21 0802  ? OLANZapine (ZYPREXA) tablet 2.5 mg  2.5 mg Oral BID Eliseo GumMcQuilla, Trayvion Embleton B, MD   2.5 mg at 06/05/21 0947  ? OLANZapine zydis (ZYPREXA) disintegrating tablet 5 mg  5 mg Oral Q8H PRN Nira ConnBerry, Jason A, NP   5 mg at 05/31/21 2138  ? And  ? ziprasidone (GEODON) injection 20 mg  20 mg  Intramuscular PRN Nira ConnBerry, Jason A, NP      ? vitamin B-12 (CYANOCOBALAMIN) tablet 1,000 mcg  1,000 mcg Oral Daily Karsten Rooda, Vandana, MD   1,000 mcg at 06/05/21 0801  ? ? ?Lab Results:  ?Results for orders placed or performed during the hospital encounter of 05/28/21 (from the past 48 hour(s))  ?CK     Status:  None  ? Collection Time: 06/03/21  6:21 PM  ?Result Value Ref Range  ? Total CK 362 49 - 397 U/L  ?  Comment: Performed at [facility], [address], [place], [place] [address]  ?CBC     Status: Abnormal  ? Collection Time: 06/03/21  6:21 PM  ?Result Value Ref Range  ? WBC 3.9 (L) 4.0 - 10.5 K/uL  ? RBC 4.50 4.22 - 5.81 MIL/uL  ? Hemoglobin 13.9 13.0 - 17.0 g/dL  ? HCT 41.1 39.0 - 52.0 %  ? MCV 91.3 80.0 - 100.0 fL  ? MCH 30.9 26.0 - 34.0 pg  ? MCHC 33.8 30.0 - 36.0 g/dL  ? RDW 13.0 11.5 - 15.5 %  ? Platelets 246 150 - 400 K/uL  ? nRBC 0.0 0.0 - 0.2 %  ?  Comment: Performed at [facility], [address], [place], [place] [address]  ?Comprehensive metabolic panel     Status: Abnormal  ? Collection Time: 06/03/21  6:21 PM  ?Result Value Ref Range  ? Sodium 137 135 - 145 mmol/L  ? Potassium 3.7 3.5 - 5.1 mmol/L  ? Chloride 103 98 - 111 mmol/L  ? CO2 27 22 - 32 mmol/L  ? Glucose, Bld 144 (H) 70 - 99 mg/dL  ?  Comment: Glucose reference range applies only to samples taken after fasting for at least 8 hours.  ? BUN 8 6 - 20 mg/dL  ? Creatinine, Ser 0.84 0.61 - 1.24 mg/dL  ? Calcium 9.0 8.9 - 10.3 mg/dL  ? Total Protein 7.8 6.5 - 8.1 g/dL  ? Albumin 4.3 3.5 - 5.0 g/dL  ? AST 23 15 - 41 U/L  ? ALT 15 0 - 44 U/L  ? Alkaline Phosphatase 70 38 - 126 U/L  ? Total Bilirubin 0.4 0.3 - 1.2 mg/dL  ? GFR, Estimated >60 >60 mL/min  ?  Comment: (NOTE) ?Calculated using the CKD-EPI Creatinine Equation (2021) ?  ? Anion gap 7 5 - 15  ?  Comment: Performed at [facility], [address] [place], [place] [address]  ?CK     Status: Abnormal  ? Collection Time: 06/04/21  6:27  PM  ?Result Value Ref Range  ? Total CK 447 (H) 49 - 397 U/L  ?  Comment: Performed at [facility], [address], [place], [place] [address]  ? ? ?Blood Alcohol level:  ?Lab Results  ?C

## 2021-06-05 NOTE — Group Note (Signed)
Recreation Therapy Group Note ? ? ?Group Topic:Problem Solving  ?Group Date: 06/05/2021 ?Start Time: 1000 ?End Time: 1040 ?Facilitators: Caroll Rancher, LRT,CTRS ?Location: [address] Dayroom ? ? ?Goal Area(s) Addresses:  ?Patient will effectively work with peer towards shared goal.  ?Patient will identify skill used to make activity successful.  ?Patient will identify how skills used during activity can be used to reach post d/c goals.  ? ? ?Group Description: Patient(s) were given a set of solo cups, a rubber band, and some tied strings. The objective is to build a pyramid with the cups by only using the rubber band and string to move the cups. After the activity the patient(s) are LRT debriefed and discussed what strategies worked, what didn't, and what lessons they can take from the activity and use in life post discharge. ? ? ?Affect/Mood: Appropriate ?  ?Participation Level: Engaged ?  ?Participation Quality: Independent ?  ?Behavior: Appropriate ?  ?Speech/Thought Process: Focused ?  ?Insight: Moderate ?  ?Judgement: Moderate ?  ?Modes of Intervention: Music and Problem-solving ?  ?Patient Response to Interventions:  Engaged ?  ?Education Outcome: ? Acknowledges education and In group clarification offered   ? ?Clinical Observations/Individualized Feedback: Pt was focused but needed a lot of direction on how to complete the activity.  Pt had a hard time following peers to complete task.  When asked what he used to complete task, pt stated "I used this big forehead". Pt was bright during group session.  ?  ? ?Plan: Continue to engage patient in RT group sessions 2-3x/week. ? ? ?Caroll Rancher, LRT,CTRS ?06/05/2021 11:21 AM ?

## 2021-06-06 MED ORDER — RENA-VITE PO TABS
1.0000 | ORAL_TABLET | Freq: Every day | ORAL | Status: DC
  Administered 2021-06-07 – 2021-06-09 (×3): 1 via ORAL
  Filled 2021-06-06: qty 14
  Filled 2021-06-06 (×4): qty 1

## 2021-06-06 NOTE — Progress Notes (Signed)
[facility] MD Progress Note ? ?06/06/2021 3:12 PM ?Stephen [name]  ?MRN:  [id number] ?Subjective:  26 year old AA male with prior hx of depression, generalized anxiety disorder & oppositional disorder. He does have hx of seizure disorder as well. He was brought to the [facility] from the [facility] with complain of suicidal ideations with plans to buy a gun from online to kill himself. Chart review indicated that indicated that he was [place] having homicidal ideations towards his family (parents & siblings) because of an argument. Vershawn was a patient in this [facility] on the adolescent's unit in 2016. His diagnosis then was oppositional defiant disorder. After the evaluation at the [facility], he was transferred to the [facility] for further psychiatric evaluation/treatments. The care everywhere reports indicated patient has been on Citalopram & Lamictal in the remote past. His currently under IVC petition. ? ?RN reports patient got PRN olanzapine for feeling anxiety and overwhelmed this morning. He still stutters sometimes and can feel confused.  ? ?On assessment this AM patient reports "I'm good". He appears anxious initially with the new provider and requires some verbal reassurance that he is okay and not in trouble. He had some broken sleep overnight. He expresses concern that he is doing things incorrectly or that others are upset with him. He was observed several times yesterday and this morning to try to intervene and calm down other agitated patients. He appears to have a difficult time when there is anger or disruption around him, and be hypersensitive to negativity and aggression. He does respond well to positive reinforcement, like being called "cool" and "calm" and takes on this demeanor. He appears to be highly influenced by others. (?dependent features). He has some auditory hallucinations today, but less so than yesterday. He feels that he is doing better overall and he wants to continue to be considered for discharge. No thoughts of harm to  self or others.  ? ?Principal Problem: Schizophrenia ([place]) ?Diagnosis: Principal Problem: ?  Schizophrenia ([place]) ?Active Problems: ?  Seizure disorder ([place]) ?  MDD (major depressive disorder), recurrent episode, moderate ([place]) ?  Tobacco use disorder ?  Mild alcohol use disorder ? ?Total Time spent with patient: 30 minutes ? ?Past Psychiatric History: See H&P ? ?Past Medical History:  ?Past Medical History:  ?Diagnosis Date  ? 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 Depression   ? Seizures ([place])   ?  ?Past Surgical History:  ?Procedure Laterality Date  ? CIRCUMCISION    ? SHOULDER CLOSED REDUCTION Right 12/08/2018  ? Procedure: CLOSED REDUCTION SHOULDER;  Surgeon: Nicholes Stairs, MD;  Location: [place];  Service: Orthopedics;  Laterality: Right;  ? ?Family History:  ?Family History  ?Problem Relation Age of Onset  ? Diabetes gravidarum Paternal Grandmother   ? Heart attack Paternal Grandmother   ? Healthy Mother   ? Healthy Father   ? ?Family Psychiatric  History: See H&&P ?Social History:  ?Social History  ? ?Substance and Sexual Activity  ?Alcohol Use Yes  ? Alcohol/week: 3.0 standard drinks  ? Types: 3 Cans of beer per week  ? Comment: every night  ?   ?Social History  ? ?Substance and Sexual Activity  ?Drug Use No  ?  ?Social History  ? ?Socioeconomic History  ? Marital status: Single  ?  Spouse name: Not on file  ? Number of children: 0  ? Years of education: Currently in college  ? Highest education level: Not on file  ?Occupational History  ?  Occupation: Ship broker at [employer]  ?Tobacco Use  ? Smoking status: Every Day  ?  Types: Cigarettes  ? Smokeless tobacco: Never  ?Vaping Use  ? Vaping Use: Every day  ? Substances: Nicotine  ?Substance and Sexual Activity  ? Alcohol use: Yes  ?  Alcohol/week: 3.0 standard drinks  ?  Types: 3 Cans of beer per week  ?  Comment: every night  ? Drug use: No  ? Sexual activity: Never  ?Other Topics Concern  ? Not on file  ?Social History Narrative  ? Izaya is  attending [employer]. 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  ? Living with his father, step-mother, and siblings.  ? Right-handed.  ? He will sometimes drink a soda.  ? ?Social Determinants of Health  ? ?Financial Resource Strain: Not on file  ?Food Insecurity: Not on file  ?Transportation Needs: Not on file  ?Physical Activity: Not on file  ?Stress: Not on file  ?Social Connections: Not on file  ? ?Additional Social History:  ?  ?  ?  ?  ?  ?  ?  ?  ?  ?  ?  ? ?Sleep: Good ? ?Appetite:  Good ? ?Current Medications: ?Current Facility-Administered Medications  ?Medication Dose Route Frequency Provider Last Rate Last Admin  ? acetaminophen (TYLENOL) tablet 650 mg  650 mg Oral Q6H PRN Rozetta Nunnery, NP   650 mg at 05/29/21 2048  ? alum & mag hydroxide-simeth (MAALOX/MYLANTA) 200-200-20 MG/5ML suspension 30 mL  30 mL Oral Q4H PRN Lindon Romp A, NP      ? amantadine (SYMMETREL) capsule 100 mg  100 mg Oral Q12H Massengill, Ovid Curd, MD   100 mg at 06/06/21 0736  ? benztropine (COGENTIN) tablet 1 mg  1 mg Oral TID AC Damita Dunnings B, MD   1 mg at 06/06/21 1248  ? escitalopram (LEXAPRO) tablet 10 mg  10 mg Oral Daily Massengill, Nathan, MD   10 mg at 06/06/21 [id number]  ? hydrOXYzine (ATARAX) tablet 25 mg  25 mg Oral TID PRN Rozetta Nunnery, NP   25 mg at 06/05/21 2126  ? lamoTRIgine (LAMICTAL) tablet 50 mg  50 mg Oral QHS Massengill, Ovid Curd, MD   50 mg at 06/05/21 2023  ? magnesium hydroxide (MILK OF MAGNESIA) suspension 30 mL  30 mL Oral Daily PRN Lindon Romp A, NP      ? melatonin tablet 5 mg  5 mg Oral QHS Nwoko, Agnes I, NP   5 mg at 06/05/21 2023  ? nicotine polacrilex (NICORETTE) gum 2 mg  2 mg Oral PRN Armando Reichert, MD   2 mg at 06/04/21 0802  ? OLANZapine (ZYPREXA) tablet 2.5 mg  2.5 mg Oral BID Damita Dunnings B, MD   2.5 mg at 06/06/21 0736  ? OLANZapine zydis (ZYPREXA) disintegrating tablet 5 mg  5 mg Oral Q8H PRN Lindon Romp A, NP   5 mg at 06/06/21 [id number]  ? And  ? ziprasidone (GEODON) injection 20 mg  20 mg Intramuscular PRN  Lindon Romp A, NP      ? vitamin B-12 (CYANOCOBALAMIN) tablet 1,000 mcg  1,000 mcg Oral Daily Armando Reichert, MD   1,000 mcg at 06/06/21 0736  ? ? ?Lab Results:  ?Results for orders placed or performed during the hospital encounter of 05/28/21 (from the past 48 hour(s))  ?CK     Status: Abnormal  ? Collection Time: 06/04/21  6:27 PM  ?Result Value Ref Range  ? Total CK 447 (H) 49 -  397 U/L  ?  Comment: Performed at [facility], [place] [address], [place], [place] [address]  ?CK     Status: Abnormal  ? Collection Time: 06/05/21  6:16 PM  ?Result Value Ref Range  ? Total CK 418 (H) 49 - 397 U/L  ?  Comment: Performed at [facility], [place] [address], [place], [place] [address]  ? ? ?Blood Alcohol level:  ?Lab Results  ?Component Value Date  ? ETH <10 05/26/2021  ? ETH 45 (H) 01/31/2021  ? ? ?Metabolic Disorder Labs: ?Lab Results  ?Component Value Date  ? HGBA1C 5.4 05/26/2021  ? MPG 108.28 05/26/2021  ? ?No results found for: PROLACTIN ?Lab Results  ?Component Value Date  ? CHOL 152 05/26/2021  ? TRIG 48 05/26/2021  ? HDL 69 05/26/2021  ? CHOLHDL 2.2 05/26/2021  ? VLDL 10 05/26/2021  ? [place] 73 05/26/2021  ? [place] 54 11/13/2014  ? ? ?Physical Findings: ?AIMS: Facial and Oral Movements ?Muscles of Facial Expression: None, normal ?Lips and Perioral Area: None, normal ?Jaw: None, normal ?Tongue: None, normal,Extremity Movements ?Upper (arms, wrists, hands, fingers): None, normal ?Lower (legs, knees, ankles, toes): None, normal, Trunk Movements ?Neck, shoulders, hips: None, normal, Overall Severity ?Severity of abnormal movements (highest score from questions above): None, normal ?Incapacitation due to abnormal movements: None, normal ?Patient's awareness of abnormal movements (rate only patient's report): No Awareness, Dental Status ?Current problems with teeth and/or dentures?: No ?Does patient usually wear dentures?: No  ?CIWA:  CIWA-Ar Total: 0 ?COWS:     ? ?Musculoskeletal: ?Strength & Muscle Tone: within normal limits ?Gait & Station: normal ?Patient leans: N/A ? ?Psychiatric Specialty Exam: ? ?Presentation  ?General Appearance: Appropriate for Environment; Casual ? ?Eye C

## 2021-06-06 NOTE — [facility] Group Notes (Addendum)
PsychoEducational Group-  ?Patients were read two poems -'' There's a deep hole in the sidewalk'' by Porita Nelson, in which they were asked to identify negative behavioral patterns contributing to their mental health. They were then read poem '' The Owl and the Chimpanzee '' By Jo Camacho- in which they were asked to identify times they acted impulsively and on emotions/ fight or flight mode. Patients were then given education on healthy coping skills and ways to help prevent negative behavioral patterns.  Pt attended and was appropriate. ?

## 2021-06-06 NOTE — Group Note (Signed)
?  [facility] LCSW Group Therapy Note ? ?Date/Time:  06/06/2021 11:15AM-12:00PM ? ?Type of Therapy and Topic:  Group Therapy:  Feelings About Hospitalization ? ?Participation Level:  Active  ? ?Description of Group ?This process group involved patients discussing their feelings related to being hospitalized, as well as the benefits they see to being in the hospital.  These feelings and benefits were itemized.  The group then brainstormed specific ways in which they could seek those same benefits when they discharge and return home. ? ?Therapeutic Goals ?Patient will identify and describe positive and negative feelings related to hospitalization ?Patient will verbalize benefits of hospitalization to themselves personally ?Patients will brainstorm together ways they can obtain similar benefits in the outpatient setting, identify barriers to wellness and possible solutions ? ?Summary of Patient Progress:  The patient expressed his primary feelings about being hospitalized are better the longer he stays because he sees himself becoming a better person.  Among the things he feels are good about being in the hospital are (1) the nurse gave him nicotine gum, (2) having the nicotine patch, and (3) being around people going through the same things. ? ?Therapeutic Modalities ?Cognitive Behavioral Therapy ?Motivational Interviewing ? ? ? ?Ambrose Mantle, LCSW ?06/06/2021, 2:30 PM ?   ?

## 2021-06-06 NOTE — Progress Notes (Signed)
Pt presents with depressed mood, affect blunted. Stephen [name] reports he is '' having trouble with my focus, my focus my focus. '' Patient is noted to have thought blocking and speaks with stutter repetitively speaking same word. He reports trouble with his thoughts stating '' can't focus, i'm I'm i'm drowsy. '' Patient appears confused and wanders hall and requires frequent redirection as he asks for same request multiple times. Patient oriented to the unit and ward rules. Prn medication given for agitation due to pt restlessness and self reported agitation/focus complaint. Minimal improvement thus far but not behavioral concerns. Pt is safe, will con't to monitor.  ?

## 2021-06-06 NOTE — [facility] Group Notes (Signed)
Goals Group ?4/22//2023 ? ? ?Group Focus: affirmation, clarity of thought, and goals/reality orientation ?Treatment Modality:  Psychoeducation ?Interventions utilized were assignment, group exercise, and support ?Purpose: To be able to understand and verbalize the reason for their admission to the hospital. To understand that the medication helps with their chemical imbalance but they also need to work on their choices in life. To be challenged to develop a list of 30 positives about themselves. Also introduce the concept that "feelings" are not reality. ? ?Participation Level:  Active ? ?Participation Quality:  Appropriate ? ?Affect:  Appropriate ? ?Cognitive:  Appropriate ? ?Insight:  Improving ? ?Engagement in Group:  Engaged ? ?Additional Comments:  Pt attended the group. States that he came here because "I chose to come here". Rates his energy at a 4/10. Did participate in the group. Stated that one of his positives is he loves to cook. Smiled when speaking of this skill. ? ?Vira Blanco A ?

## 2021-06-07 MED ORDER — ESCITALOPRAM OXALATE 20 MG PO TABS
20.0000 mg | ORAL_TABLET | Freq: Every day | ORAL | Status: DC
  Administered 2021-06-08 – 2021-06-10 (×3): 20 mg via ORAL
  Filled 2021-06-07 (×2): qty 1
  Filled 2021-06-07: qty 14
  Filled 2021-06-07 (×2): qty 1

## 2021-06-07 NOTE — Progress Notes (Signed)
Pt presents with depressed/anxious mood, affect blunted. Stephen [name] continues to have stuttering speech and appears visible uncomfortable when speaking with Clinical research associate about concerns and how he is doing. He reports that last night he had a good night and slept well, and had a good visit with mother. He denies any SI/ HI or A/V Hallucinations at this time. He has been compliant with medications and visible on the unit appropriate in the dayroom. He does exhibit disorganized thinking at times as he will repeatedly ask for the same thing, moments after getting it. Pt asked for am medications, and then moments later asked again '' did I take everything I am supposed to ?'' He responds well to reassurance and emotional support given. He denies any other acute concerns at this time. Pt is safe, will con't to monitor.  ?

## 2021-06-07 NOTE — Progress Notes (Signed)
Adult Psychoeducational Group Note ? ?Date:  06/07/2021 ?Time:  9:57 PM ? ?Group Topic/Focus:  ?Wrap-Up Group:   The focus of this group is to help patients review their daily goal of treatment and discuss progress on daily workbooks. ? ?Participation Level:  Active ? ?Participation Quality:  Appropriate ? ?Affect:  Appropriate ? ?Cognitive:  Disorganized and Confused ? ?Insight: Appropriate ? ?Engagement in Group:  Engaged ? ?Modes of Intervention:  Discussion ? ?Additional Comments:  Pt stated his goal for today was to focus on his treatment plan. Pt stated he accomplished his goal today. Pt stated he talked with his doctor and social worker about his care today. Pt rated his overall day a 7 out of 10. Pt stated he was able to contact his parents today which improved his over all day. Pt stated his mother coming for visitation tonight improved his over all night.  Pt stated he felt better about himself today. Pt stated he was able to attend all meals. Pt stated he took all medications provided today. Pt stated he attend all groups held today. Pt stated his appetite was pretty good today. Pt rated sleep last night was fair. Pt stated the goal tonight was to get some rest. Pt stated he had no physical pain tonight. Pt deny visual hallucinations and auditory issues tonight. Pt denies thoughts of harming himself or others. Pt stated he would alert staff if anything changed ? ?Felipa Furnace ?06/07/2021, 9:57 PM ?

## 2021-06-07 NOTE — [facility] Group Notes (Signed)
PsychoEducational Group- ?Patients were instructed on positive reframing. They were given examples and education of how negative thinking can impact mood, cause emotional outbursts and impact overall thinking. The patients were read a poem by the dalia lama, and then asked one negative thought that they would like to let go of in life. Pt attended and was appropriate. ?

## 2021-06-07 NOTE — [facility] Group Notes (Signed)
Adult Psychoeducational Group Not ?Date:  06/07/2021 ?Time:  [id number] ?Group Topic/Focus: PROGRESSIVE RELAXATION. [name] group where deep breathing is taught and tensing and relaxation muscle groups is used. Imagery is used as well.  Pts are asked to imagine 3 pillars that hold them up when they are not able to hold themselves up and to share that with the group. ? ?Participation Level:  Active ? ?Participation Quality:  Appropriate ? ?Affect:  Appropriate ? ?Cognitive:  Oriented ? ?Insight: Improving ? ?Engagement in Group:  Engaged ? ?Modes of Intervention:  Activity, Discussion, Education, and Support ? ?Additional Comments:  Rates energy at [name] 10/10. States his dad and mom hold him up. ? ?Stephen [name] ? ?

## 2021-06-07 NOTE — Group Note (Signed)
[facility] LCSW Group Therapy Note ? ?Date/Time:  06/07/2021  11:00AM-12:00PM ? ?Type of Therapy and Topic:  Group Therapy:  Music and Mood ? ?Participation Level:  Active  ? ?Description of Group: ?In this process group, members listened to a variety of genres of music and identified that different types of music evoke different responses.  Patients were encouraged to identify music that was soothing for them and music that was energizing for them.  Patients discussed how this knowledge can help with wellness and recovery in various ways including managing depression and anxiety as well as encouraging healthy sleep habits.   ? ?Therapeutic Goals: ?Patients will explore the impact of different varieties of music on mood ?Patients will verbalize the thoughts they have when listening to different types of music ?Patients will identify music that is soothing to them as well as music that is energizing to them ?Patients will discuss how to use this knowledge to assist in maintaining wellness and recovery ?Patients will explore the use of music as a coping skill ? ?Summary of Patient Progress:  At the beginning of group, patient expressed his mood was "alright."  At the end of group, patient expressed his mood was "pretty good."   ? ?Therapeutic Modalities: ?Solution Focused Brief Therapy ?Activity ? ? ?Ambrose Mantle, LCSW ?  ?

## 2021-06-07 NOTE — [facility] Group Notes (Signed)
Orientation/Goals Group- Pt attended goals group. Stating his goal was to work on learning more about addiction. ?

## 2021-06-07 NOTE — Progress Notes (Signed)
[facility] MD Progress Note ? ?06/07/2021 4:49 PM ?Stephen [name]  ?MRN:  [id number] ?Subjective:  26 year old AA male with prior hx of depression, generalized anxiety disorder & oppositional disorder. He does have hx of seizure disorder as well. He was brought to the [facility] from the [facility] with complain of suicidal ideations with plans to buy a gun from online to kill himself. Chart review indicated that indicated that he was [name] having homicidal ideations towards his family (parents & siblings) because of an argument. Stephen [name] was a patient in this [facility] on the adolescent's unit in 2016. His diagnosis then was oppositional defiant disorder. After the evaluation at the [facility], he was transferred to the [facility] for further psychiatric evaluation/treatments. The care everywhere reports indicated patient has been on Citalopram & Lamictal in the remote past. His currently under IVC petition. ?  ? ?On assessment today the patient reports that the groups are going okay. His sleep pattern is broken overall. He is eating regularly. He feels that his auditory hallucinations continue to improve. His stutter seems to be improving as well. His eye contact is better and he seems more relaxed overall. He is not having thoughts of harm to self or others. He does tend to ruminate a bit.  ? ?Principal Problem: Schizophrenia (HCC) ?Diagnosis: Principal Problem: ?  Schizophrenia (HCC) ?Active Problems: ?  Seizure disorder (HCC) ?  MDD (major depressive disorder), recurrent episode, moderate (HCC) ?  Tobacco use disorder ?  Mild alcohol use disorder ? ?Total Time spent with patient: 30 minutes ? ?Past Psychiatric History: See H&P ? ?Past Medical History:  ?Past Medical History:  ?Diagnosis Date  ? 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 Depression   ? Seizures (HCC)   ?  ?Past Surgical History:  ?Procedure Laterality Date  ? CIRCUMCISION    ? SHOULDER CLOSED REDUCTION Right 12/08/2018  ? Procedure: CLOSED REDUCTION SHOULDER;  Surgeon:  Yolonda Kida, MD;  Location: [facility] OR;  Service: Orthopedics;  Laterality: Right;  ? ?Family History:  ?Family History  ?Problem Relation Age of Onset  ? Diabetes gravidarum Paternal Grandmother   ? Heart attack Paternal Grandmother   ? Healthy Mother   ? Healthy Father   ? ?Family Psychiatric  History: See H&&P ?Social History:  ?Social History  ? ?Substance and Sexual Activity  ?Alcohol Use Yes  ? Alcohol/week: 3.0 standard drinks  ? Types: 3 Cans of beer per week  ? Comment: every night  ?   ?Social History  ? ?Substance and Sexual Activity  ?Drug Use No  ?  ?Social History  ? ?Socioeconomic History  ? Marital status: Single  ?  Spouse name: Not on file  ? Number of children: 0  ? Years of education: Currently in college  ? Highest education level: Not on file  ?Occupational History  ? Occupation: Consulting civil engineer at [employer]  ?Tobacco Use  ? Smoking status: Every Day  ?  Types: Cigarettes  ? Smokeless tobacco: Never  ?Vaping Use  ? Vaping Use: Every day  ? Substances: Nicotine  ?Substance and Sexual Activity  ? Alcohol use: Yes  ?  Alcohol/week: 3.0 standard drinks  ?  Types: 3 Cans of beer per week  ?  Comment: every night  ? Drug use: No  ? Sexual activity: Never  ?Other Topics Concern  ? Not on file  ?Social History Narrative  ? [name] is attending [employer]. He is Glass blower/designer in Primary school teacher.  ? Living with his father,  step-mother, and siblings.  ? Right-handed.  ? He will sometimes drink a soda.  ? ?Social Determinants of Health  ? ?Financial Resource Strain: Not on file  ?Food Insecurity: Not on file  ?Transportation Needs: Not on file  ?Physical Activity: Not on file  ?Stress: Not on file  ?Social Connections: Not on file  ? ?Additional Social History:  ?  ?  ?  ?  ?  ?  ?  ?  ?  ?  ?  ? ?Sleep: Good ? ?Appetite:  Good ? ?Current Medications: ?Current Facility-Administered Medications  ?Medication Dose Route Frequency Provider Last Rate Last Admin  ? acetaminophen (TYLENOL) tablet 650 mg  650 mg Oral Q6H PRN Jackelyn PolingBerry,  Jason A, NP   650 mg at 06/06/21 2244  ? alum & mag hydroxide-simeth (MAALOX/MYLANTA) 200-200-20 MG/5ML suspension 30 mL  30 mL Oral Q4H PRN Nira ConnBerry, Jason A, NP      ? amantadine (SYMMETREL) capsule 100 mg  100 mg Oral Q12H Massengill, Harrold DonathNathan, MD   100 mg at 06/07/21 [id number]  ? benztropine (COGENTIN) tablet 1 mg  1 mg Oral TID AC Eliseo GumMcQuilla, Jai B, MD   1 mg at 06/07/21 1638  ? [START ON 06/08/2021] escitalopram (LEXAPRO) tablet 20 mg  20 mg Oral Daily Dariyah Garduno, Shelbie HutchingStephanie Leigh, MD      ? hydrOXYzine (ATARAX) tablet 25 mg  25 mg Oral TID PRN Jackelyn PolingBerry, Jason A, NP   25 mg at 06/06/21 2130  ? lamoTRIgine (LAMICTAL) tablet 50 mg  50 mg Oral QHS Massengill, Harrold DonathNathan, MD   50 mg at 06/06/21 2131  ? magnesium hydroxide (MILK OF MAGNESIA) suspension 30 mL  30 mL Oral Daily PRN Nira ConnBerry, Jason A, NP      ? melatonin tablet 5 mg  5 mg Oral QHS Nwoko, Nicole KindredAgnes I, NP   5 mg at 06/06/21 2002  ? multivitamin (RENA-VIT) tablet 1 tablet  1 tablet Oral QHS Keslee Harrington, Shelbie HutchingStephanie Leigh, MD      ? nicotine polacrilex (NICORETTE) gum 2 mg  2 mg Oral PRN Karsten Rooda, Vandana, MD   2 mg at 06/04/21 0802  ? OLANZapine (ZYPREXA) tablet 2.5 mg  2.5 mg Oral BID Eliseo GumMcQuilla, Jai B, MD   2.5 mg at 06/07/21 [id number]  ? OLANZapine zydis (ZYPREXA) disintegrating tablet 5 mg  5 mg Oral Q8H PRN Nira ConnBerry, Jason A, NP   5 mg at 06/06/21 2130  ? And  ? ziprasidone (GEODON) injection 20 mg  20 mg Intramuscular PRN Nira ConnBerry, Jason A, NP      ? vitamin B-12 (CYANOCOBALAMIN) tablet 1,000 mcg  1,000 mcg Oral Daily Karsten Rooda, Vandana, MD   1,000 mcg at 06/07/21 [id number]  ? ? ?Lab Results:  ?Results for orders placed or performed during the hospital encounter of 05/28/21 (from the past 48 hour(s))  ?CK     Status: Abnormal  ? Collection Time: 06/05/21  6:16 PM  ?Result Value Ref Range  ? Total CK 418 (H) 49 - 397 U/L  ?  Comment: Performed at [facility], [address] [place], [place] [address]  ? ? ?Blood Alcohol level:  ?Lab Results  ?Component Value Date  ? ETH <10 05/26/2021  ? ETH 45  (H) 01/31/2021  ? ? ?Metabolic Disorder Labs: ?Lab Results  ?Component Value Date  ? HGBA1C 5.4 05/26/2021  ? MPG 108.28 05/26/2021  ? ?No results found for: PROLACTIN ?Lab Results  ?Component Value Date  ? CHOL 152 05/26/2021  ? TRIG 48 05/26/2021  ?  HDL 69 05/26/2021  ? CHOLHDL 2.2 05/26/2021  ? VLDL 10 05/26/2021  ? LDLCALC 73 05/26/2021  ? LDLCALC 54 11/13/2014  ? ? ?Physical Findings: ?AIMS: Facial and Oral Movements ?Muscles of Facial Expression: None, normal ?Lips and Perioral Area: None, normal ?Jaw: None, normal ?Tongue: None, normal,Extremity Movements ?Upper (arms, wrists, hands, fingers): None, normal ?Lower (legs, knees, ankles, toes): None, normal, Trunk Movements ?Neck, shoulders, hips: None, normal, Overall Severity ?Severity of abnormal movements (highest score from questions above): None, normal ?Incapacitation due to abnormal movements: None, normal ?Patient's awareness of abnormal movements (rate only patient's report): No Awareness, Dental Status ?Current problems with teeth and/or dentures?: No ?Does patient usually wear dentures?: No  ?CIWA:  CIWA-Ar Total: 0 ?COWS:    ? ?Musculoskeletal: ?Strength & Muscle Tone: within normal limits ?Gait & Station: normal ?Patient leans: N/A ? ?Psychiatric Specialty Exam: ? ?Presentation  ?General Appearance: Appropriate for Environment; Casual ? ?Eye Contact:Fair ? ?Speech:Clear and Coherent (a bit of a stammer on occasion) ? ?Speech Volume:Normal ? ?Handedness:Right ? ? ?Mood and Affect  ?Mood:-- (happy) ? ?Affect:Blunt; Restricted ? ? ?Thought Process  ?Thought Processes:Linear ? ?Descriptions of Associations:Circumstantial ? ?Orientation:Full (Time, Place and Person) ? ?Thought Content:Logical ? ?History of Schizophrenia/Schizoaffective disorder:Yes ? ?Duration of Psychotic Symptoms:Less than six months ? ?Hallucinations:No data recorded ? ?Ideas of Reference:None ? ?Suicidal Thoughts:No data recorded ? ?Homicidal Thoughts:No data recorded ? ? ?Sensorium   ?Memory:Immediate Fair ? ?Judgment:-- (Improving) ? ?Insight:Shallow ? ? ?Executive Functions  ?Concentration:Fair ? ?Attention Span:Fair ? ?Recall:Fair ? ?Fund of Knowledge:Fair ? ?Language:Fair ? ? ?Psych

## 2021-06-07 NOTE — Progress Notes (Signed)
Stephen [name] continues to pace the unit.  He doesn't sit long in the day room.  He was complaining of feeling anxious, a little more agitated and restless.  PRN's given with good relief.  He denied SI/HI or AVH.  He reported a good visit with his mother this evening.  He reported his appetite good.  He remains fixated on medication and asks frequently if he can have more medications.  He denied any pain or discomfort and appeared to be in no physical distress.  He is currently resting with his eyes closed and appears to be asleep.  Q 15 minute checks maintained for safety.  He remains safe on the unit. ? ? 06/07/21 2115  ?Psych Admission Type (Psych Patients Only)  ?Admission Status Involuntary  ?Psychosocial Assessment  ?Patient Complaints Anxiety;Restlessness;Nervousness  ?Eye Contact Darting  ?Facial Expression Anxious  ?Affect Fearful  ?Speech Slow;Soft  ?Interaction Cautious;Forwards little  ?Motor Activity Restless;Pacing;Fidgety  ?Appearance/Hygiene Unremarkable  ?Behavior Characteristics Cooperative;Fidgety;Pacing  ?Mood Anxious;Suspicious  ?Thought Process  ?Coherency Blocking  ?Content WDL  ?Delusions WDL  ?Perception Hallucinations  ?Hallucination Auditory  ?Judgment Impaired  ?Confusion Moderate  ?Danger to Self  ?Current suicidal ideation? Denies  ?Danger to Others  ?Danger to Others None reported or observed  ? ? ?

## 2021-06-08 ENCOUNTER — Encounter (HOSPITAL_COMMUNITY): Payer: Self-pay

## 2021-06-08 LAB — BASIC METABOLIC PANEL
Anion gap: 8 (ref 5–15)
BUN: 10 mg/dL (ref 6–20)
CO2: 23 mmol/L (ref 22–32)
Calcium: 8.9 mg/dL (ref 8.9–10.3)
Chloride: 109 mmol/L (ref 98–111)
Creatinine, Ser: 0.79 mg/dL (ref 0.61–1.24)
GFR, Estimated: >60 mL/min (ref >60)
Glucose, Bld: 99 mg/dL (ref 70–99)
Potassium: 4 mmol/L (ref 3.5–5.1)
Sodium: 140 mmol/L (ref 135–145)

## 2021-06-08 LAB — MAGNESIUM: Magnesium: 1.9 mg/dL (ref 1.7–2.4)

## 2021-06-08 LAB — CK: Total CK: 178 U/L (ref 49–397)

## 2021-06-08 NOTE — BHH Group Notes (Signed)
Spiritual care group on grief and loss facilitated by chaplain Dyanne Carrel, [facility]  ? ?Group Goal:  ? ?Support / Education around grief and loss  ? ?Members engage in facilitated group support and psycho-social education.  ? ?Group Description:  ? ?Following introductions and group rules, group members engaged in facilitated group dialog and support around topic of loss, with particular support around experiences of loss in their lives. Group Identified types of loss (relationships / self / things) and identified patterns, circumstances, and changes that precipitate losses. Reflected on thoughts / feelings around loss, normalized grief responses, and recognized variety in grief experience. Group noted Worden's four tasks of grief in discussion.  ? ?Group drew on Adlerian / Rogerian, narrative, MI,  ? ?Patient Progress: Stephen [name] came into group towards the end.  His participation was minimal, but he showed signs of engagement.   ? ?[employer], [facility] ?Pager, [phone] ? ?

## 2021-06-08 NOTE — Group Note (Signed)
Occupational Therapy Group Note ? ?Group Topic:Communication  ?Group Date: 06/08/2021 ?Start Time: 1400 ?End Time: 1500 ?Facilitators: Ted Mcalpine, OT  ? ?Group Description: Group encouraged increased engagement and participation through discussion focused on communication styles. Patients were educated on the different styles of communication including passive, aggressive, assertive, and passive-aggressive communication. Group members shared and reflected on which styles they most often find themselves communicating in and brainstormed strategies on how to transition and practice a more assertive approach. Further discussion explored how to use assertiveness skills and strategies to further advocate and ask questions as it relates to their treatment plan and mental health.  ? ?Therapeutic Goal(s): ?Identify practical strategies to improve communication skills  ?Identify how to use assertive communication skills to address individual needs and wants ? ? ?Participation Level: Minimal and Active ?  ?Participation Quality: Minimal Cues ?  ?Behavior: Calm, Isolative, and Reserved ?  ?Speech/Thought Process: Disorganized and Loose association  ?  ?Affect/Mood: Blunted and Flat ?  ?Insight: Lacking ?  ?Judgement: Limited ?  ?Individualization: Pt was reserved and minimal in their participation of group discussion/activity. Simple and basic communication skills identified w/ OT reenforcement.   ?Modes of Intervention: Activity, Discussion, Education, and Role-play  ?Patient Response to Interventions:  Disengaged ?  ?Plan: Continue to engage patient in OT groups 2 - 3x/week. ? ?06/08/2021  ?Ted Mcalpine, OT ?Kerrin Champagne, OT ? ? ?

## 2021-06-08 NOTE — BH IP Treatment Plan (Signed)
Interdisciplinary Treatment and Diagnostic Plan Update ? ?06/08/2021 ?Time of Session: 11:00am ?Stephen [name] ?MRN: [id number] ? ?Principal Diagnosis: Schizophrenia (HCC) ? ?Secondary Diagnoses: Principal Problem: ?  Schizophrenia (HCC) ?Active Problems: ?  Seizure disorder (HCC) ?  MDD (major depressive disorder), recurrent episode, moderate (HCC) ?  Tobacco use disorder ?  Mild alcohol use disorder ? ? ?Current Medications:  ?Current Facility-Administered Medications  ?Medication Dose Route Frequency Provider Last Rate Last Admin  ? acetaminophen (TYLENOL) tablet 650 mg  650 mg Oral Q6H PRN Jackelyn Poling, NP   650 mg at 06/06/21 2244  ? alum & mag hydroxide-simeth (MAALOX/MYLANTA) 200-200-20 MG/5ML suspension 30 mL  30 mL Oral Q4H PRN Nira Conn A, NP      ? amantadine (SYMMETREL) capsule 100 mg  100 mg Oral Q12H Massengill, Harrold Donath, MD   100 mg at 06/08/21 0730  ? benztropine (COGENTIN) tablet 1 mg  1 mg Oral TID AC Eliseo Gum B, MD   1 mg at 06/08/21 0730  ? escitalopram (LEXAPRO) tablet 20 mg  20 mg Oral Daily Hill, Shelbie Hutching, MD   20 mg at 06/08/21 0730  ? hydrOXYzine (ATARAX) tablet 25 mg  25 mg Oral TID PRN Jackelyn Poling, NP   25 mg at 06/07/21 2118  ? lamoTRIgine (LAMICTAL) tablet 50 mg  50 mg Oral QHS Massengill, Harrold Donath, MD   50 mg at 06/07/21 2118  ? magnesium hydroxide (MILK OF MAGNESIA) suspension 30 mL  30 mL Oral Daily PRN Nira Conn A, NP      ? melatonin tablet 5 mg  5 mg Oral QHS Armandina Stammer I, NP   5 mg at 06/07/21 2118  ? multivitamin (RENA-VIT) tablet 1 tablet  1 tablet Oral QHS Hill, Shelbie Hutching, MD   1 tablet at 06/07/21 2118  ? nicotine polacrilex (NICORETTE) gum 2 mg  2 mg Oral PRN Karsten Ro, MD   2 mg at 06/04/21 0802  ? OLANZapine (ZYPREXA) tablet 2.5 mg  2.5 mg Oral BID Eliseo Gum B, MD   2.5 mg at 06/08/21 0730  ? OLANZapine zydis (ZYPREXA) disintegrating tablet 5 mg  5 mg Oral Q8H PRN Nira Conn A, NP   5 mg at 06/07/21 2152  ? And  ? ziprasidone (GEODON)  injection 20 mg  20 mg Intramuscular PRN Nira Conn A, NP      ? vitamin B-12 (CYANOCOBALAMIN) tablet 1,000 mcg  1,000 mcg Oral Daily Karsten Ro, MD   1,000 mcg at 06/08/21 0730  ? ?PTA Medications: ?Medications Prior to Admission  ?Medication Sig Dispense Refill Last Dose  ? lamoTRIgine (LAMICTAL) 100 MG tablet Take 1/2 tablet every night (Patient taking differently: Take 50 mg by mouth at bedtime.) 45 tablet 3   ? traZODone (DESYREL) 50 MG tablet Take 1 tablet (50 mg total) by mouth at bedtime. 30 tablet 11   ? ? ?Patient Stressors: Medication change or noncompliance   ? ?Patient Strengths: Communication skills  ?Supportive family/friends  ? ?Treatment Modalities: Medication Management, Group therapy, Case management,  ?1 to 1 session with clinician, Psychoeducation, Recreational therapy. ? ? ?Physician Treatment Plan for Primary Diagnosis: Schizophrenia (HCC) ?Long Term Goal(s): Improvement in symptoms so as ready for discharge  ? ?Short Term Goals: Ability to identify and develop effective coping behaviors will improve ?Compliance with prescribed medications will improve ?Ability to identify triggers associated with substance abuse/mental health issues will improve ?Ability to identify changes in lifestyle to reduce recurrence of condition will improve ?Ability to verbalize  feelings will improve ?Ability to disclose and discuss suicidal ideas ?Ability to demonstrate self-control will improve ? ?Medication Management: Evaluate patient's response, side effects, and tolerance of medication regimen. ? ?Therapeutic Interventions: 1 to 1 sessions, Unit Group sessions and Medication administration. ? ?Evaluation of Outcomes: Progressing ? ?Physician Treatment Plan for Secondary Diagnosis: Principal Problem: ?  Schizophrenia (HCC) ?Active Problems: ?  Seizure disorder (HCC) ?  MDD (major depressive disorder), recurrent episode, moderate (HCC) ?  Tobacco use disorder ?  Mild alcohol use disorder ? ?Long Term  Goal(s): Improvement in symptoms so as ready for discharge  ? ?Short Term Goals: Ability to identify and develop effective coping behaviors will improve ?Compliance with prescribed medications will improve ?Ability to identify triggers associated with substance abuse/mental health issues will improve ?Ability to identify changes in lifestyle to reduce recurrence of condition will improve ?Ability to verbalize feelings will improve ?Ability to disclose and discuss suicidal ideas ?Ability to demonstrate self-control will improve    ? ?Medication Management: Evaluate patient's response, side effects, and tolerance of medication regimen. ? ?Therapeutic Interventions: 1 to 1 sessions, Unit Group sessions and Medication administration. ? ?Evaluation of Outcomes: Progressing ? ? ?RN Treatment Plan for Primary Diagnosis: Schizophrenia (HCC) ?Long Term Goal(s): Knowledge of disease and therapeutic regimen to maintain health will improve ? ?Short Term Goals: Ability to remain free from injury will improve, Ability to verbalize frustration and anger appropriately will improve, Ability to demonstrate self-control, Ability to identify and develop effective coping behaviors will improve, and Compliance with prescribed medications will improve ? ?Medication Management: RN will administer medications as ordered by provider, will assess and evaluate patient's response and provide education to patient for prescribed medication. RN will report any adverse and/or side effects to prescribing provider. ? ?Therapeutic Interventions: 1 on 1 counseling sessions, Psychoeducation, Medication administration, Evaluate responses to treatment, Monitor vital signs and CBGs as ordered, Perform/monitor CIWA, COWS, AIMS and Fall Risk screenings as ordered, Perform wound care treatments as ordered. ? ?Evaluation of Outcomes: Progressing ? ? ?LCSW Treatment Plan for Primary Diagnosis: Schizophrenia (HCC) ?Long Term Goal(s): Safe transition to  appropriate next level of care at discharge, Engage patient in therapeutic group addressing interpersonal concerns. ? ?Short Term Goals: Engage patient in aftercare planning with referrals and resources, Increase social support, Increase ability to appropriately verbalize feelings, Increase emotional regulation, and Increase skills for wellness and recovery ? ?Therapeutic Interventions: Assess for all discharge needs, 1 to 1 time with Child psychotherapistocial worker, Explore available resources and support systems, Assess for adequacy in community support network, Educate family and significant other(s) on suicide prevention, Complete Psychosocial Assessment, Interpersonal group therapy. ? ?Evaluation of Outcomes: Progressing ? ? ?Progress in Treatment: ?Attending groups: Yes. ?Participating in groups: Yes. ?Taking medication as prescribed: Yes. ?Toleration medication: Yes. ?Family/Significant other contact made: Yes, individual(s) contacted:  father, [name] ?Patient understands diagnosis: No. ?Discussing patient identified problems/goals with staff: Yes. ?Medical problems stabilized or resolved: Yes. ?Denies suicidal/homicidal ideation: Yes. ?Issues/concerns per patient self-inventory: No. ?Other: none ?  ?New problem(s) identified: No, Describe:  none reported ?  ?New Short Term/Long Term Goal(s):  ?  ?medication stabilization, elimination of SI thoughts, development of comprehensive mental wellness plan.  ?  ?  ?Patient Goals:  Patient states he would like to be clean from cigarettes and alcohol. ?  ?Discharge Plan or Barriers: Patient [facility].  ?  ?  ?Reason for Continuation of Hospitalization: Delusions  ?Medication stabilization ? ?  ?Estimated Length  of Stay: 1-3 days ? ?Last 3 [place] Suicide Severity Risk Score: ?Flowsheet Row [facility] (Current) [facility]2021 in [facility] from 01/31/2021  in [facility]  ?C-SSRS RISK CATEGORY No Risk High Risk No Risk  ? ?  ? ? ?Last PHQ 2/9 Scores: ? ?  05/26/2021  ?  3:17 PM 02/20/2020  ?  3:11 PM 02/20/2020  ?  2:20 PM  ?Depression screen PHQ 2/

## 2021-06-08 NOTE — BHH Group Notes (Signed)
Adult Psychoeducational Group Note ? ?Date:  06/08/2021 ?Time:  9:17 AM ? ?Group Topic/Focus:  ?Orientation:   The focus of this group is to educate the patient on the purpose and policies of crisis stabilization and provide a format to answer questions about their admission.  The group details unit policies and expectations of patients while admitted. ? ?Participation Level:  Active ? ?Participation Quality:  Attentive ? ?Affect:  Appropriate ? ?Cognitive:  Appropriate ? ?Insight: Appropriate ? ?Engagement in Group:  Improving ? ?Modes of Intervention:  Discussion ? ?Additional Comments:  Patient was attentive the duration of the group.  ? ?[name] [name] ?06/08/2021, 9:17 AM ?

## 2021-06-08 NOTE — Progress Notes (Signed)
Pt presents with pleasant mood, affect blunted. Stephen [name] continues to show improvement, as he brightens upon approach and even smiled with this Clinical research associate today. He states he slept well and denies any acute concerns, stating his goal is to continue to '' be a good person and focus on addiction recovery'' he does have a stutter which is more noticeable during group times when expressing himself in front of multiple people. He has been medication compliant, labs drawn this am. He is visible on the unit attending programming and in no acute distress with no voiced concerns. Pt is safe, above discussed in tx team and plan to step pt down from 500 unit. ?

## 2021-06-08 NOTE — Group Note (Signed)
Date:  06/08/2021 ?Time:  11:54 AM ? ?Group Topic/Focus:  ?Orientation:   The focus of this group is to educate the patient on the purpose and policies of crisis stabilization and provide a format to answer questions about their admission.  The group details unit policies and expectations of patients while admitted. ? ? ? ?Participation Level:  Did Not Attend ? ?Participation Quality:   ? ?Affect:   ? ?Cognitive:   ? ?Insight:  ? ?Engagement in Group:   ? ?Modes of Intervention:   ? ?Additional Comments:   ? ?Alassane Kalafut Lashawn Lainie Daubert ?06/08/2021, 11:54 AM ? ?

## 2021-06-08 NOTE — Progress Notes (Signed)
Pt mother wants paperwork for social security so that she can apply for disability for him. Pt is also wanting a lis of medications in the order he needs to take them.  ? Kelby Fam [phone] ?

## 2021-06-08 NOTE — Group Note (Signed)
Recreation Therapy Group Note ? ? ?Group Topic:Coping Skills  ?Group Date: 06/08/2021 ?Start Time: 1000 ?End Time: 1045 ?Facilitators: Caroll Rancher, LRT,CTRS ?Location: [address] Day[address] ? ? ?Goal Area(s) Addresses:  ?Patient will identify positive coping skill techniques. ?Patient will identify benefits of using coping skills post d/c. ? ?Group Description: Mind Map.  Patient was provided a blank template of a diagram with 32 blank boxes in a tiered system, branching from the center (similar to a bubble chart). LRT directed patients to label the middle of the diagram "Coping Skills" and LRT and patients filled in the first 8 boxes together with different challenges (anxiety, depression, stress, being overwhelmed, anger, PTSD/Trauma, Bi-Polar and addiction) that would require the use of coping skills.  Pt were directed to record their coping skills in the 2nd tier boxes closest to the center.  LRT would wrote the coping skills on the board for each challenge so patients could fill in any blank spaces in their charts. ? ? ?Affect/Mood: Appropriate ?  ?Participation Level: Engaged ?  ?Participation Quality: Independent ?  ?Behavior: Appropriate ?  ?Speech/Thought Process: Focused ?  ?Insight: Fair ?  ?Judgement: Fair  ?  ?Modes of Intervention: Worksheet ?  ?Patient Response to Interventions:  Engaged ?  ?Education Outcome: ? Acknowledges education and In group clarification offered   ? ?Clinical Observations/Individualized Feedback: Pt was appropriate and engaged during group session.  Pt had a hard time putting thoughts into words.  Pt needed assistance coming up with coping skills for the challenges.  Pt was able to identify coping skills such as taking a cool shower, crying/yelling, rest, peace/quiet, punching a pillow/punching bag and getting a  job.  ? ? ?Plan: Continue to engage patient in RT group sessions 2-3x/week. ? ? ?Caroll Rancher, LRT,CTRS ?06/08/2021 11:43 AM ?

## 2021-06-08 NOTE — Progress Notes (Signed)
[facility] MD Progress Note ? ?06/08/2021 2:28 PM ?Stephen [name]  ?MRN:  [id number] ?Subjective:  26 year old AA male with prior hx of depression, generalized anxiety disorder & oppositional disorder. He does have hx of seizure disorder as well. He was brought to the [facility] from the [facility] with complain of suicidal ideations with plans to buy a gun from online to kill himself. Chart review indicated that indicated that he was [name] having homicidal ideations towards his family (parents & siblings) because of an argument. Stephen [name] was a patient in this [facility] on the adolescent's unit in 2016. His diagnosis then was oppositional defiant disorder. After the evaluation at the [facility], he was transferred to the [facility] for further psychiatric evaluation/treatments. The care everywhere reports indicated patient has been on Citalopram & Lamictal in the remote past. His currently under IVC petition. ?  ?On assessment today the patient states he is doing okay and his mood is "good".  He reports that he slept well last night and he has been attending groups.  He reports stable appetite.  He denies any SI, HI, AVH.  He reports that he has not heard any voices since admission.  He does not feel any muscle soreness.  He denies any medication side effects and has been tolerating them well.  He denies any physical symptoms.  On examination, cog-wheeling and stiffness in bilateral upper extremities still present but seems to be improving. ? ?Principal Problem: Schizophrenia (HCC) ?Diagnosis: Principal Problem: ?  Schizophrenia (HCC) ?Active Problems: ?  Seizure disorder (HCC) ?  MDD (major depressive disorder), recurrent episode, moderate (HCC) ?  Tobacco use disorder ?  Mild alcohol use disorder ? ?Total Time spent with patient: 30 minutes ? ?Past Psychiatric History: See H&P ? ?Past Medical History:  ?Past Medical History:  ?Diagnosis Date  ? 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 Depression   ? Seizures (HCC)   ?  ?Past Surgical  History:  ?Procedure Laterality Date  ? CIRCUMCISION    ? SHOULDER CLOSED REDUCTION Right 12/08/2018  ? Procedure: CLOSED REDUCTION SHOULDER;  Surgeon: Yolonda Kida, MD;  Location: [facility] OR;  Service: Orthopedics;  Laterality: Right;  ? ?Family History:  ?Family History  ?Problem Relation Age of Onset  ? Diabetes gravidarum Paternal Grandmother   ? Heart attack Paternal Grandmother   ? Healthy Mother   ? Healthy Father   ? ?Family Psychiatric  History: See H&&P ?Social History:  ?Social History  ? ?Substance and Sexual Activity  ?Alcohol Use Yes  ? Alcohol/week: 3.0 standard drinks  ? Types: 3 Cans of beer per week  ? Comment: every night  ?   ?Social History  ? ?Substance and Sexual Activity  ?Drug Use No  ?  ?Social History  ? ?Socioeconomic History  ? Marital status: Single  ?  Spouse name: Not on file  ? Number of children: 0  ? Years of education: Currently in college  ? Highest education level: Not on file  ?Occupational History  ? Occupation: Consulting civil engineer at [employer]  ?Tobacco Use  ? Smoking status: Every Day  ?  Types: Cigarettes  ? Smokeless tobacco: Never  ?Vaping Use  ? Vaping Use: Every day  ? Substances: Nicotine  ?Substance and Sexual Activity  ? Alcohol use: Yes  ?  Alcohol/week: 3.0 standard drinks  ?  Types: 3 Cans of beer per week  ?  Comment: every night  ? Drug use: No  ? Sexual activity: Never  ?Other Topics  Concern  ? Not on file  ?Social History Narrative  ? Stephen [name] is attending [employer]. He is Glass blower/designer in Primary school teacher.  ? Living with his father, step-mother, and siblings.  ? Right-handed.  ? He will sometimes drink a soda.  ? ?Social Determinants of Health  ? ?Financial Resource Strain: Not on file  ?Food Insecurity: Not on file  ?Transportation Needs: Not on file  ?Physical Activity: Not on file  ?Stress: Not on file  ?Social Connections: Not on file  ? ?Additional Social History:  ?  ?  ?  ?  ?  ?  ?  ?  ?  ?  ?  ? ?Sleep: Good ? ?Appetite:  Good ? ?Current Medications: ?Current  Facility-Administered Medications  ?Medication Dose Route Frequency Provider Last Rate Last Admin  ? acetaminophen (TYLENOL) tablet 650 mg  650 mg Oral Q6H PRN Jackelyn Poling, NP   650 mg at 06/06/21 2244  ? alum & mag hydroxide-simeth (MAALOX/MYLANTA) 200-200-20 MG/5ML suspension 30 mL  30 mL Oral Q4H PRN Nira Conn A, NP      ? amantadine (SYMMETREL) capsule 100 mg  100 mg Oral Q12H Massengill, Harrold Donath, MD   100 mg at 06/08/21 0730  ? benztropine (COGENTIN) tablet 1 mg  1 mg Oral TID AC McQuilla, Gerlean Ren B, MD   1 mg at 06/08/21 1201  ? escitalopram (LEXAPRO) tablet 20 mg  20 mg Oral Daily Hill, Shelbie Hutching, MD   20 mg at 06/08/21 0730  ? hydrOXYzine (ATARAX) tablet 25 mg  25 mg Oral TID PRN Jackelyn Poling, NP   25 mg at 06/07/21 2118  ? lamoTRIgine (LAMICTAL) tablet 50 mg  50 mg Oral QHS Massengill, Harrold Donath, MD   50 mg at 06/07/21 2118  ? magnesium hydroxide (MILK OF MAGNESIA) suspension 30 mL  30 mL Oral Daily PRN Nira Conn A, NP      ? melatonin tablet 5 mg  5 mg Oral QHS Armandina Stammer I, NP   5 mg at 06/07/21 2118  ? multivitamin (RENA-VIT) tablet 1 tablet  1 tablet Oral QHS Hill, Shelbie Hutching, MD   1 tablet at 06/07/21 2118  ? nicotine polacrilex (NICORETTE) gum 2 mg  2 mg Oral PRN Karsten Ro, MD   2 mg at 06/04/21 0802  ? OLANZapine (ZYPREXA) tablet 2.5 mg  2.5 mg Oral BID Eliseo Gum B, MD   2.5 mg at 06/08/21 0730  ? OLANZapine zydis (ZYPREXA) disintegrating tablet 5 mg  5 mg Oral Q8H PRN Nira Conn A, NP   5 mg at 06/07/21 2152  ? And  ? ziprasidone (GEODON) injection 20 mg  20 mg Intramuscular PRN Nira Conn A, NP      ? vitamin B-12 (CYANOCOBALAMIN) tablet 1,000 mcg  1,000 mcg Oral Daily Karsten Ro, MD   1,000 mcg at 06/08/21 0730  ? ? ?Lab Results:  ?Results for orders placed or performed during the hospital encounter of 05/28/21 (from the past 48 hour(s))  ?CK     Status: None  ? Collection Time: 06/08/21  6:41 AM  ?Result Value Ref Range  ? Total CK 178 49 - 397 U/L  ?  Comment:  Performed at [facility], [address], [place], [place] [address]  ?Basic metabolic panel     Status: None  ? Collection Time: 06/08/21  6:41 AM  ?Result Value Ref Range  ? Sodium 140 135 - 145 mmol/L  ? Potassium 4.0 3.5 - 5.1 mmol/L  ?  Chloride 109 98 - 111 mmol/L  ? CO2 23 22 - 32 mmol/L  ? Glucose, Bld 99 70 - 99 mg/dL  ?  Comment: Glucose reference range applies only to samples taken after fasting for at least 8 hours.  ? BUN 10 6 - 20 mg/dL  ? Creatinine, Ser 0.79 0.61 - 1.24 mg/dL  ? Calcium 8.9 8.9 - 10.3 mg/dL  ? GFR, Estimated >60 >60 mL/min  ?  Comment: (NOTE) ?Calculated using the CKD-EPI Creatinine Equation (2021) ?  ? Anion gap 8 5 - 15  ?  Comment: Performed at [facility], [address] [place], [place] [address]  ?Magnesium     Status: None  ? Collection Time: 06/08/21  6:41 AM  ?Result Value Ref Range  ? Magnesium 1.9 1.7 - 2.4 mg/dL  ?  Comment: Performed at [facility], [address] [place], [place] [address]  ? ? ?Blood Alcohol level:  ?Lab Results  ?Component Value Date  ? ETH <10 05/26/2021  ? ETH 45 (H) 01/31/2021  ? ? ?Metabolic Disorder Labs: ?Lab Results  ?Component Value Date  ? HGBA1C 5.4 05/26/2021  ? MPG 108.28 05/26/2021  ? ?No results found for: PROLACTIN ?Lab Results  ?Component Value Date  ? CHOL 152 05/26/2021  ? TRIG 48 05/26/2021  ? HDL 69 05/26/2021  ? CHOLHDL 2.2 05/26/2021  ? VLDL 10 05/26/2021  ? LDLCALC 73 05/26/2021  ? LDLCALC 54 11/13/2014  ? ? ?Physical Findings: ?AIMS: Facial and Oral Movements ?Muscles of Facial Expression: None, normal ?Lips and Perioral Area: None, normal ?Jaw: None, normal ?Tongue: None, normal,Extremity Movements ?Upper (arms, wrists, hands, fingers): None, normal ?Lower (legs, knees, ankles, toes): None, normal, Trunk Movements ?Neck, shoulders, hips: None, normal, Overall Severity ?Severity of abnormal movements (highest score from questions above): None, normal ?Incapacitation  due to abnormal movements: None, normal ?Patient's awareness of abnormal movements (rate only patient's report): No Awareness, Dental Status ?Current problems with teeth and/or dentures?: No ?Does patient usually wear dentures?:

## 2021-06-08 NOTE — BHH Group Notes (Signed)
[facility] Group Notes:  (Nursing/MHT/Case Management/Adjunct) ? ?Date:  06/08/2021  ?Time:  8:22 PM ? ?Type of Therapy:   AA ? ?Participation Level:  Active ? ?Participation Quality:  Appropriate and Attentive ? ?Affect:  Anxious and Appropriate ? ?Cognitive:  Alert and Appropriate ? ?Insight:  Appropriate, Good, and Improving ? ?Engagement in Group:  Developing/Improving ? ?Modes of Intervention:  Discussion ? ?Summary of Progress/Problems: ? ?Stephen [name] ?06/08/2021, 8:22 PM ?

## 2021-06-08 NOTE — Group Note (Signed)
LCSW Group Therapy Note ? ? ?Group Date: 06/08/2021 ?Start Time: 1300 ?End Time: 1400 ? ?Type of Therapy and Topic:  Group Therapy: Understanding the differences between fear and anxiety / Fear Ladder & Examining the Evidence ? ?Participation Level:  Active ? ? ?Description of Group:   ?In this group session, patients learned how to define and recognize the similarities differences between fear and anxiety. Patients will explore reactions we have to fear and anxiety. Patients identified a fear or something they feel anxious about. Patients analyzed scenarios and discussed both the positive and negative aspects to them. Patients were asked to identify if it is a fear or anxiety as well.  Patients were asked to provide their own examples. Patients will learn what to do for both fear and anxiety. CSW provided tools such as breaking things up into smaller steps, challenging automatic negative thinking / questions to ask, fear ladder and how to use gradual exposure. Patients will be asked to practice each tool with situations and to complete a fear ladder for their personal gain.  ? ?Therapeutic Goals: ?Patients will learn the difference between fear versus anxiety and the definitions of both. ?Patients will utilize scenarios and be able to identify if this is an example of a fear or anxiety. ?Patients will learn tools to handle both their fears and anxieties as well as discuss breaking it into smaller steps and exposure.  ?Patients will be asked to provide examples of their own and discuss how things have both positive and negative aspects.  ?Patients were provided questions to ask to challenge automatic negative thinking for anxiety.  ?Patients were provided a sample form of a fear ladder and asked to complete one for a fear.  ? ?Summary of Patient Progress:  Patient was engaged and participated in the group session. Patient shared their understanding of fear and anxiety   . Patient shared one fear and one anxiety.  Patient reports their plan to help with fear/anxiety to be creating boundaries.  ? ?Therapeutic Modalities:   ?Cognitive Behavioral Therapy ?Motivational Interviewing  ?Brief Therapy ? ?Aram Beecham, LCSWA ?06/08/2021  1:56 PM   ? ?

## 2021-06-09 MED ORDER — AMANTADINE HCL 100 MG PO CAPS: 100.0000 mg | ORAL_CAPSULE | Freq: Two times a day (BID) | ORAL | 0 refills | Status: AC

## 2021-06-09 MED ORDER — ESCITALOPRAM OXALATE 20 MG PO TABS: 20.0000 mg | ORAL_TABLET | Freq: Every day | ORAL | 0 refills | Status: AC

## 2021-06-09 MED ORDER — BENZTROPINE MESYLATE 1 MG PO TABS: 1.0000 mg | ORAL_TABLET | Freq: Three times a day (TID) | ORAL | 0 refills | Status: AC

## 2021-06-09 MED ORDER — RENA-VITE PO TABS: 1.0000 | ORAL_TABLET | Freq: Every day | ORAL | 0 refills | Status: AC

## 2021-06-09 MED ORDER — HYDROXYZINE HCL 25 MG PO TABS: 25.0000 mg | ORAL_TABLET | Freq: Three times a day (TID) | ORAL | 0 refills | Status: AC | PRN

## 2021-06-09 MED ORDER — CYANOCOBALAMIN 1000 MCG PO TABS: 1000.0000 ug | ORAL_TABLET | Freq: Every day | ORAL | 0 refills | Status: AC

## 2021-06-09 MED ORDER — NICOTINE POLACRILEX 2 MG MT GUM: 2.0000 mg | CHEWING_GUM | OROMUCOSAL | 0 refills | Status: DC | PRN

## 2021-06-09 MED ORDER — LAMOTRIGINE 25 MG PO TABS: 50.0000 mg | ORAL_TABLET | Freq: Every day | ORAL | 0 refills | Status: DC

## 2021-06-09 MED ORDER — OLANZAPINE 2.5 MG PO TABS: 2.5000 mg | ORAL_TABLET | Freq: Two times a day (BID) | ORAL | 0 refills | Status: AC

## 2021-06-09 MED ORDER — MELATONIN 5 MG PO TABS: 5.0000 mg | ORAL_TABLET | Freq: Every day | ORAL | 0 refills | Status: AC

## 2021-06-09 MED ORDER — NICOTINE 14 MG/24HR TD PT24
14.0000 mg | MEDICATED_PATCH | Freq: Every day | TRANSDERMAL | Status: DC
  Filled 2021-06-09: qty 14

## 2021-06-09 MED ORDER — NICOTINE 14 MG/24HR TD PT24: 14.0000 mg | MEDICATED_PATCH | Freq: Every day | TRANSDERMAL | 0 refills | Status: AC

## 2021-06-09 NOTE — Progress Notes (Signed)
[facility] MD Progress Note ? ?06/09/2021 4:21 PM ?Stephen [name]  ?MRN:  [id number] ?Subjective:  26 year old AA male with prior hx of depression, generalized anxiety disorder & oppositional disorder. He does have hx of seizure disorder as well. He was brought to the [facility] from the [facility] with complain of suicidal ideations with plans to buy a gun from online to kill himself. Chart review indicated that indicated that he was [name] having homicidal ideations towards his family (parents & siblings) because of an argument. Stephen [name] was a patient in this [facility] on the adolescent's unit in 2016. His diagnosis then was oppositional defiant disorder. After the evaluation at the [facility], he was transferred to the [facility] for further psychiatric evaluation/treatments. The care everywhere reports indicated patient has been on Citalopram & Lamictal in the remote past. His currently under IVC petition. ? slept well last night  ?On assessment today the patient states his mood is "good".  He reports that he did not sleep well last night and woke up few times.  Discussed option to go to [facility] tomorrow for inpatient rehab or discharge to home.  He states that he would like to go to [facility].  Patient has intake appointment at [facility] tomorrow morning at 9 AM.  He has been attending groups. He reports stable appetite.  He denies any SI, HI, AVH.  He denies any muscle soreness.  He denies any medication side effects and has been tolerating them well.  He denies any physical symptoms.  On examination, Mild stiffness in upper extremities still present  Lt >Rt and seems to be improving.  ?Aims 0 ?Principal Problem: Schizophrenia (HCC) ?Diagnosis: Principal Problem: ?  Schizophrenia (HCC) ?Active Problems: ?  Seizure disorder (HCC) ?  MDD (major depressive disorder), recurrent episode, moderate (HCC) ?  Tobacco use disorder ?  Mild alcohol use disorder ? ?Total Time spent with patient: 30 minutes ? ?Past Psychiatric History: See H&P ? ?Past Medical History:   ?Past Medical History:  ?Diagnosis Date  ? 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 Depression   ? Seizures (HCC)   ?  ?Past Surgical History:  ?Procedure Laterality Date  ? CIRCUMCISION    ? SHOULDER CLOSED REDUCTION Right 12/08/2018  ? Procedure: CLOSED REDUCTION SHOULDER;  Surgeon: Yolonda Kida, MD;  Location: [facility] OR;  Service: Orthopedics;  Laterality: Right;  ? ?Family History:  ?Family History  ?Problem Relation Age of Onset  ? Diabetes gravidarum Paternal Grandmother   ? Heart attack Paternal Grandmother   ? Healthy Mother   ? Healthy Father   ? ?Family Psychiatric  History: See H&&P ?Social History:  ?Social History  ? ?Substance and Sexual Activity  ?Alcohol Use Yes  ? Alcohol/week: 3.0 standard drinks  ? Types: 3 Cans of beer per week  ? Comment: every night  ?   ?Social History  ? ?Substance and Sexual Activity  ?Drug Use No  ?  ?Social History  ? ?Socioeconomic History  ? Marital status: Single  ?  Spouse name: Not on file  ? Number of children: 0  ? Years of education: Currently in college  ? Highest education level: Not on file  ?Occupational History  ? Occupation: Consulting civil engineer at [employer]  ?Tobacco Use  ? Smoking status: Every Day  ?  Types: Cigarettes  ? Smokeless tobacco: Never  ?Vaping Use  ? Vaping Use: Every day  ? Substances: Nicotine  ?Substance and Sexual Activity  ? Alcohol use: Yes  ?  Alcohol/week:  3.0 standard drinks  ?  Types: 3 Cans of beer per week  ?  Comment: every night  ? Drug use: No  ? Sexual activity: Never  ?Other Topics Concern  ? Not on file  ?Social History Narrative  ? [place] is attending [employer]. He is Glass blower/designer in Primary school teacher.  ? Living with his father, step-mother, and siblings.  ? Right-handed.  ? He will sometimes drink a soda.  ? ?Social Determinants of Health  ? ?Financial Resource Strain: Not on file  ?Food Insecurity: Not on file  ?Transportation Needs: Not on file  ?Physical Activity: Not on file  ?Stress: Not on file  ?Social Connections: Not on  file  ? ?Additional Social History:  ?  ?  ?  ?  ?  ?  ?  ?  ?  ?  ?  ? ?Sleep: Good ? ?Appetite:  Good ? ?Current Medications: ?Current Facility-Administered Medications  ?Medication Dose Route Frequency Provider Last Rate Last Admin  ? acetaminophen (TYLENOL) tablet 650 mg  650 mg Oral Q6H PRN Jackelyn Poling, NP   650 mg at 06/06/21 2244  ? alum & mag hydroxide-simeth (MAALOX/MYLANTA) 200-200-20 MG/5ML suspension 30 mL  30 mL Oral Q4H PRN Nira Conn A, NP      ? amantadine (SYMMETREL) capsule 100 mg  100 mg Oral Q12H Massengill, Harrold Donath, MD   100 mg at 06/09/21 0802  ? benztropine (COGENTIN) tablet 1 mg  1 mg Oral TID AC McQuilla, Gerlean Ren B, MD   1 mg at 06/09/21 1151  ? escitalopram (LEXAPRO) tablet 20 mg  20 mg Oral Daily Hill, Shelbie Hutching, MD   20 mg at 06/09/21 4098  ? hydrOXYzine (ATARAX) tablet 25 mg  25 mg Oral TID PRN Jackelyn Poling, NP   25 mg at 06/09/21 0802  ? lamoTRIgine (LAMICTAL) tablet 50 mg  50 mg Oral QHS Massengill, Harrold Donath, MD   50 mg at 06/08/21 2116  ? magnesium hydroxide (MILK OF MAGNESIA) suspension 30 mL  30 mL Oral Daily PRN Nira Conn A, NP      ? melatonin tablet 5 mg  5 mg Oral QHS Armandina Stammer I, NP   5 mg at 06/08/21 2114  ? multivitamin (RENA-VIT) tablet 1 tablet  1 tablet Oral QHS Roselle Locus, MD   1 tablet at 06/08/21 2115  ? [START ON 06/11/2021] nicotine (NICODERM CQ - dosed in mg/24 hours) patch 14 mg  14 mg Transdermal Daily Massengill, Nathan, MD      ? nicotine polacrilex (NICORETTE) gum 2 mg  2 mg Oral PRN Karsten Ro, MD   2 mg at 06/04/21 0802  ? OLANZapine (ZYPREXA) tablet 2.5 mg  2.5 mg Oral BID Eliseo Gum B, MD   2.5 mg at 06/09/21 0802  ? OLANZapine zydis (ZYPREXA) disintegrating tablet 5 mg  5 mg Oral Q8H PRN Nira Conn A, NP   5 mg at 06/07/21 2152  ? And  ? ziprasidone (GEODON) injection 20 mg  20 mg Intramuscular PRN Nira Conn A, NP      ? vitamin B-12 (CYANOCOBALAMIN) tablet 1,000 mcg  1,000 mcg Oral Daily Karsten Ro, MD   1,000 mcg at  06/09/21 0802  ? ? ?Lab Results:  ?Results for orders placed or performed during the hospital encounter of 05/28/21 (from the past 48 hour(s))  ?CK     Status: None  ? Collection Time: 06/08/21  6:41 AM  ?Result Value Ref Range  ? Total CK 178 49 -  397 U/L  ?  Comment: Performed at [facility], [address] [place], [place] [address]  ?Basic metabolic panel     Status: None  ? Collection Time: 06/08/21  6:41 AM  ?Result Value Ref Range  ? Sodium 140 135 - 145 mmol/L  ? Potassium 4.0 3.5 - 5.1 mmol/L  ? Chloride 109 98 - 111 mmol/L  ? CO2 23 22 - 32 mmol/L  ? Glucose, Bld 99 70 - 99 mg/dL  ?  Comment: Glucose reference range applies only to samples taken after fasting for at least 8 hours.  ? BUN 10 6 - 20 mg/dL  ? Creatinine, Ser 0.79 0.61 - 1.24 mg/dL  ? Calcium 8.9 8.9 - 10.3 mg/dL  ? GFR, Estimated >60 >60 mL/min  ?  Comment: (NOTE) ?Calculated using the CKD-EPI Creatinine Equation (2021) ?  ? Anion gap 8 5 - 15  ?  Comment: Performed at [facility], [address] [place], [place] [address]  ?Magnesium     Status: None  ? Collection Time: 06/08/21  6:41 AM  ?Result Value Ref Range  ? Magnesium 1.9 1.7 - 2.4 mg/dL  ?  Comment: Performed at [facility], [address] [place], [place] [address]  ? ? ?Blood Alcohol level:  ?Lab Results  ?Component Value Date  ? ETH <10 05/26/2021  ? ETH 45 (H) 01/31/2021  ? ? ?Metabolic Disorder Labs: ?Lab Results  ?Component Value Date  ? HGBA1C 5.4 05/26/2021  ? MPG 108.28 05/26/2021  ? ?No results found for: PROLACTIN ?Lab Results  ?Component Value Date  ? CHOL 152 05/26/2021  ? TRIG 48 05/26/2021  ? HDL 69 05/26/2021  ? CHOLHDL 2.2 05/26/2021  ? VLDL 10 05/26/2021  ? LDLCALC 73 05/26/2021  ? LDLCALC 54 11/13/2014  ? ? ?Physical Findings: ?AIMS: Facial and Oral Movements ?Muscles of Facial Expression: None, normal ?Lips and Perioral Area: None, normal ?Jaw: None, normal ?Tongue: None, normal,Extremity  Movements ?Upper (arms, wrists, hands, fingers): None, normal ?Lower (legs, knees, ankles, toes): None, normal, Trunk Movements ?Neck, shoulders, hips: None, normal, Overall Severity ?Severity of abnormal movements (highe

## 2021-06-09 NOTE — BHH Suicide Risk Assessment (Addendum)
Suicide Risk Assessment ? ?Discharge Assessment    ?[facility] Discharge Suicide Risk Assessment ? ? ?Principal Problem: Schizophrenia ([place]) ?Discharge Diagnoses: Principal Problem: ?  Schizophrenia ([place]) ?Active Problems: ?  Seizure disorder ([place]) ?  MDD (major depressive disorder), recurrent episode, moderate ([place]) ?  Tobacco use disorder ?  Mild alcohol use disorder ? ? ?Total Time spent with patient: 30 minutes ? 26 year old AA male with prior hx of depression, generalized anxiety disorder & oppositional disorder. He does have hx of seizure disorder as well. He was brought to the [facility] from the [facility] with complain of suicidal ideations with plans to buy a gun from online to kill himself.  ?HOSPITAL COURSE: ? ?During the patient's hospitalization, patient had extensive initial psychiatric evaluation, and follow-up psychiatric evaluations every day. ? ?Psychiatric diagnoses provided upon initial assessment:  ?-Brief psychotic disorder versus schizophrenia (if symptoms persisting for 6 months) versus substance-induced psychotic disorder (although the patient denies illicit substance use) ?-Major depressive disorder.  ?-Tobacco use disorder ?-Rule out alcohol use disorder ? ?Diagnosis updated to : ?Principal Problem ?Schizophrenia ?Active problems ?  Seizure disorder ?  MDD (major depressive disorder), recurrent episode, moderate ([place]) ?  Tobacco use disorder ?  Mild alcohol use disorder ?  ?Patient's psychiatric medications were adjusted on admission:  ?- Initiated Abilify 5 mg po daily for mood stabilization. ?- Started Hydroxyzine 25 mg po qid prn for anxiety.  ?- Initiated Citalopram 10 mg po daily for depression. ?- Changed Lamictal from 50 mg to 25 mg po daily (home medication).  ?- Continue Nicorette gum 2 mg Q 4 hrs while awake.  ?- Initiated Melatonin 5 mg po Q hs  for insomnia ?During the hospitalization, other adjustments were made to the patient's psychiatric medication regimen:  ?-Abilify  discontinued.(Ineffective) ?-Zyprexa was initially discontinued and switched to risperidone due to LAI option with risperidone. Zyprexa restarted at low dose 2.51m BID ?- Continue to hold  risperidone due to stiffness ?- Started and Continued Cogentin 1 mg TID  for EPS. (CK trending down 178 normal at d/c) ?- Started and Continue Amantadine 1055mBID, for cogwheel rigidity ?- LAI considered but deferred  due to stiffness ?- Continued Hydroxyzine 25 mg po qid prn for anxiety.  ?- Started and Continued Lexapro 20 mg daily for depression. ?- Continued Lamictal  50 mg daily (home medication 25 mg).  ?- Continued Nicorette gum 2 mg as needed while awake.  ?- Continued Melatonin 5 mg po Q hs  for insomnia.  ?- First psychosis labs (vitamin B12 169), HIV NR, ESR WNL, RPR, ANA NR and ceruloplasmin low at 12.3 and CT head negative.  ? ?Patient's care was discussed during the interdisciplinary team meeting every day during the hospitalization. ? ?The patient denies having side effects to prescribed psychiatric medication. ? ?Gradually, patient started adjusting to milieu. The patient was evaluated each day by a clinical provider to ascertain response to treatment. Improvement was noted by the patient's report of decreasing symptoms, improved sleep and appetite, affect, medication tolerance, behavior, and participation in unit programming.  Patient was asked each day to complete a self inventory noting mood, mental status, pain, new symptoms, anxiety and concerns.   ?Symptoms were reported as significantly decreased or resolved completely by discharge.  ?The patient reports that their mood is stable.  ?The patient denied having suicidal thoughts for more than 48 hours prior to discharge.  Patient denies having homicidal thoughts.  Patient denies having auditory hallucinations.  Patient denies any visual  hallucinations or other symptoms of psychosis.  ?The patient was motivated to continue taking medication with a goal of  continued improvement in mental health.  ? ?The patient reports their target psychiatric symptoms of Schizophrenia responded well to the psychiatric medications, and the patient reports overall benefit other psychiatric hospitalization. Supportive psychotherapy was provided to the patient. The patient also participated in regular group therapy while hospitalized. Coping skills, problem solving as well as relaxation therapies were also part of the unit programming. ? ?Labs were reviewed with the patient, and abnormal results were discussed with the patient. ? ?The patient is able to verbalize their individual safety plan to this provider. ? ?# It is recommended to the patient to continue psychiatric medications as prescribed, after discharge from the hospital.   ? ?# It is recommended to the patient to follow up with your outpatient psychiatric provider and PCP. ? ?# It was discussed with the patient, the impact of alcohol, drugs, tobacco have been there overall psychiatric and medical wellbeing, and total abstinence from substance use was recommended the patient.ed. ? ?# Prescriptions provided or sent directly to preferred pharmacy at discharge. Patient agreeable to plan. Given opportunity to ask questions. Appears to feel comfortable with discharge.  ?  ?# In the event of worsening symptoms, the patient is instructed to call the crisis hotline, 911 and or go to the nearest ED for appropriate evaluation and treatment of symptoms. To follow-up with primary care provider for other medical issues, concerns and or health care needs ? ?# Patient was discharged [facility] with a plan to follow up as noted below.  ?Musculoskeletal: ?Strength & Muscle Tone: within normal limits ?Gait & Station: normal ?Patient leans: N/A ? ?Psychiatric Specialty Exam ? ?Presentation  ?General Appearance: Appropriate for Environment; Casual ? ?Eye Contact:Fair ? ?Speech:Normal Rate (Some stuttering) ? ?Speech  Volume:Normal ? ?Handedness:Right ? ? ?Mood and Affect  ?Mood:Euthymic ? ?Duration of Depression Symptoms: Greater than two weeks ? ?Affect:Restricted ? ? ?Thought Process  ?Thought Processes:Linear ? ?Descriptions of Associations:Intact ? ?Orientation:Full (Time, Place and Person) ? ?Thought Content:Logical ? ?History of Schizophrenia/Schizoaffective disorder:No ? ?Duration of Psychotic Symptoms:Greater than six months ? ?Hallucinations:Hallucinations: None ?Description of Auditory Hallucinations: Denies ?Description of Visual Hallucinations: Denies ? ?Ideas of Reference:None ? ?Suicidal Thoughts:Suicidal Thoughts: No ? ?Homicidal Thoughts:Homicidal Thoughts: No ? ? ?Sensorium  ?Memory:Immediate Fair ? ?Judgment:-- (Improving) ? ?Insight:Shallow ? ? ?Executive Functions  ?Concentration:Fair ? ?Attention Span:Fair ? ?Recall:Fair ? ?[place] ? ?Language:Fair ? ? ?Psychomotor Activity  ?Psychomotor Activity:Psychomotor Activity: Extrapyramidal Side Effects (EPS) (Some rigidity and cogwheeling present Lt >Rt. much improved) ?Extrapyramidal Side Effects (EPS): Parkinsonism ?AIMS Completed?: Yes ? ? ?Assets  ?Assets:Desire for Improvement; Resilience; Physical Health; Social Support ? ? ?Sleep  ?Sleep:Sleep: Good ?Number of Hours of Sleep: 7 ? ? ?Physical Exam: ?Physical Exam see d/c summary ?ROS see d/c summary ?Blood pressure (!) 92/52, pulse (!) 115, temperature (!) 97.5 ?F (36.4 ?C), temperature source Oral, resp. rate 16, height 5' 10"  (1.778 m), weight 64.9 kg, SpO2 100 %. Body mass index is 20.52 kg/m?. ? ?Mental Status Per Nursing Assessment::   ?On Admission:  Thoughts of violence towards others ?Demographic factors:  Male, Adolescent or young adult, Unemployed ?Current Mental Status:  Thoughts of violence towards others ?Loss Factors:  NA ?Historical Factors:  Impulsivity ?Risk Reduction Factors:  Living with another person, especially a relative ?Continued Clinical Symptoms:   ?Dysthymia ?Alcohol/Substance Abuse/Dependencies ?Schizophrenia:   Depressive state ? ?Cognitive Features That Contribute To Risk:  ?  Closed-mindedness and Thought constriction (tunnel vision)   ? ?Suicide Risk:  ?Mild:  There are no identifiable suicide plans, no associated intent, m

## 2021-06-09 NOTE — Progress Notes (Signed)
?  [facility] Adult Case Management Discharge Plan : ? ?Will you be returning to the same living situation after discharge:  No- patient will be going to Rehab at [facility] ?At discharge, do you have transportation home?: No. Patient will get taxi ?Do you have the ability to pay for your medications: Yes,  patient provided with discount card ? ?Release of information consent forms completed and in the chart;  Patient's signature needed at discharge. ? ?Patient to Follow up at: ? Follow-up Information   ? ? [facility]. Go to.   ?Specialty: Behavioral Health ?Why: Please go to this provider for therapy and medication management services during walk in times:  Monday and Wednesday, beginning at 7:30 am.  Please arrive at this time as services are provided on a first come, first served basis. ?Contact information: ?[address] ?[place] [address] ?[phone] ? ?  ?  ? ? Services, [facility] Follow up.   ?Why: A referral has been made to this provider for residential treatment services. ?Contact information: ?[address] ?[place] [address] ?[phone] ? ? ?  ?  ? ?  ?  ? ?  ? ? ?Next level of care provider has access to [facility] Link:yes ? ?Safety Planning and Suicide Prevention discussed: Yes,  father and mother ? ?  ? ?Has patient been referred to the Quitline?: Patient refused referral, patient states that he hopes to get some support while at rehab ? ?Patient has been referred for addiction treatment: Yes, patient will be going to [facility]. ? ?Dymphna Wadley E Jamieson Lisa, LCSW ?06/09/2021, 3:56 PM ?

## 2021-06-09 NOTE — BHH Group Notes (Signed)
Pt attended group and participated in discussion. 

## 2021-06-09 NOTE — Progress Notes (Signed)
Patient compliant with medications denies SI/HI/A/VH stated he might be going home but he is not ready to go because "I don't want to be a bad person like I did before." ? ?Support and encouragement provided. Patient remains safe. ?

## 2021-06-09 NOTE — Progress Notes (Signed)
Pt denies SI/HI/AVH and verbally agrees to approach staff if these become apparent or before harming themselves/others. Rates depression 6/10. Rates anxiety 4/10. Rates pain 0/10.  Pt is still thought blocking, pacing, anxious, and has a hard time getting into conversation. Scheduled medications administered to pt, per MD orders. RN provided support and encouragement to pt. Q15 min safety checks implemented and continued. Pt safe on the unit. RN will continue to monitor and intervene as needed.  ? 06/09/21 0802  ?Psych Admission Type (Psych Patients Only)  ?Admission Status Involuntary  ?Psychosocial Assessment  ?Patient Complaints Anxiety;Worrying  ?Eye Contact Brief  ?Facial Expression Anxious  ?Affect Sad;Fearful  ?Speech Soft;Other (Comment) ?(stutters)  ?Interaction Cautious;Forwards little  ?Motor Activity Restless;Pacing  ?Appearance/Hygiene Unremarkable  ?Behavior Characteristics Cooperative;Anxious;Pacing  ?Mood Anxious;Suspicious  ?Thought Process  ?Coherency Blocking  ?Content WDL  ?Delusions None reported or observed  ?Perception WDL  ?Hallucination None reported or observed  ?Judgment Impaired  ?Confusion None  ?Danger to Self  ?Current suicidal ideation? Denies  ?Danger to Others  ?Danger to Others None reported or observed  ? ? ?

## 2021-06-09 NOTE — Group Note (Signed)
Recreation Therapy Group Note ? ? ?Group Topic:Animal Assisted Therapy   ?Group Date: 06/09/2021 ?Start Time: 1430 ?End Time: 1515 ?Facilitators: Caroll Rancher, LRT,CTRS ?Location: [address] ? ? ?Animal-Assisted Activity (AAA) Program Checklist/Progress Note ?Patient Eligibility Criteria Checklist & Daily Group note for Rec Tx Intervention ? ?AAA/T Program Assumption of Risk Form signed by Patient/ or Parent Legal Guardian YES ? ?Patient is free of allergies or severe asthma  YES ? ?Patient reports no fear of animals YES ? ?Patient reports no history of cruelty to animals YES ? ?Patient understands their participation is voluntary YES ? ?Patient washes hands before animal contact YES ? ?Patient washes hands after animal contact YES ? ? ?Group Description: Patients provided opportunity to interact with trained and credentialed [employer] Therapy dog and the community volunteer/dog handler. Patients practiced appropriate animal interaction and were educated on dog safety outside of the hospital in common community settings. Patients were allowed to use dog toys and other items to practice commands, engage the dog in play, and/or complete routine aspects of animal care.  ? ?Education: Charity fundraiser, Health visitor, Communication & Social Skills  ? ? ?Affect/Mood: Appropriate ?  ?Participation Level: Engaged ?  ? ?Clinical Observations/Individualized Feedback: Pt attended and participated in group session.  ?  ? ?Plan: Continue to engage patient in RT group sessions 2-3x/week. ? ? ?Caroll Rancher, LRT,CTRS ?06/09/2021 4:22 PM ?

## 2021-06-09 NOTE — BHH Counselor (Signed)
CSW spoke with patient mother, Juliene Pina.  Mother does not want Mehmet to go to Rehab and discussed that she is planning on assisting him to get back to hometown, [place], to get needed treatment there.  CSW explained that patient has already agreed to rehab and he will need to consent to be discharged to mom.  Mother reporting that he is better staying with her than his father and that she has also been working on getting him disability.  Mother reports that she is also working on getting guardianship of patient so that she can help him make decisions for his ongoing care.  CSW agreed to have a conversation with patient regarding what he would like his disposition about.  ? ? ?Colie Fugitt, LCSW, LCAS ?Clincal Social Worker  ?[facility] ? ?

## 2021-06-09 NOTE — BHH Counselor (Signed)
CSW informed patient about options presented by his mother regarding treatment.  Patient continues to discuss desire to go to rehab.  Patient will go to rehab at [facility] tomorrow.  ? ? ?Envi Eagleson, LCSW, LCAS ?Clincal Social Worker  ?[facility] ? ?

## 2021-06-10 NOTE — Discharge Summary (Signed)
Physician Discharge Summary Note ? ?Patient:  Stephen [name] is an 26 y.o., male ?MRN:  [id number] ?DOB:  Aug 27, 1995 ?Patient phone:  [phone] (home)  ?Patient address:   ?[address] ?[place] [address],  ?Total Time spent with patient: 30 minutes ? ?Date of Admission:  05/28/2021 ?Date of Discharge: 06/10/21 ? ?Reason for Admission:   This is the second psychiatric admission for this 26 year old AA male with prior hx of depression, generalized anxiety disorder & oppositional disorder. He does have hx of seizure disorder as well. He was brought to the [facility] from the [facility] with complain of suicidal ideations with plans to buy a gun from online to kill himself. ? ?Principal Problem: Schizophrenia ([place]) ?Discharge Diagnoses: Principal Problem: ?  Schizophrenia ([place]) ?Active Problems: ?  Seizure disorder ([place]) ?  MDD (major depressive disorder), recurrent episode, moderate ([place]) ?  Tobacco use disorder ?  Mild alcohol use disorder ? ? ?Past Psychiatric History: Major depression, Oppositional defiant disorder, GAD. ? ?Past Medical History:  ?Past Medical History:  ?Diagnosis Date  ? 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 Depression   ? Seizures ([place])   ?  ?Past Surgical History:  ?Procedure Laterality Date  ? CIRCUMCISION    ? SHOULDER CLOSED REDUCTION Right 12/08/2018  ? Procedure: CLOSED REDUCTION SHOULDER;  Surgeon: Nicholes Stairs, MD;  Location: [place];  Service: Orthopedics;  Laterality: Right;  ? ?Family History:  ?Family History  ?Problem Relation Age of Onset  ? Diabetes gravidarum Paternal Grandmother   ? Heart attack Paternal Grandmother   ? Healthy Mother   ? Healthy Father   ? ?Family Psychiatric  History: Denies any familial hx of mental illnesses. ?Social History:  ?Social History  ? ?Substance and Sexual Activity  ?Alcohol Use Yes  ? Alcohol/week: 3.0 standard drinks  ? Types: 3 Cans of beer per week  ? Comment: every night  ?   ?Social History  ? ?Substance and Sexual  Activity  ?Drug Use No  ?  ?Social History  ? ?Socioeconomic History  ? Marital status: Single  ?  Spouse name: Not on file  ? Number of children: 0  ? Years of education: Currently in college  ? Highest education level: Not on file  ?Occupational History  ? Occupation: Ship broker at [employer]  ?Tobacco Use  ? Smoking status: Every Day  ?  Types: Cigarettes  ? Smokeless tobacco: Never  ?Vaping Use  ? Vaping Use: Every day  ? Substances: Nicotine  ?Substance and Sexual Activity  ? Alcohol use: Yes  ?  Alcohol/week: 3.0 standard drinks  ?  Types: 3 Cans of beer per week  ?  Comment: every night  ? Drug use: No  ? Sexual activity: Never  ?Other Topics Concern  ? Not on file  ?Social History Narrative  ? [name] is attending [employer]. 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  ? Living with his father, step-mother, and siblings.  ? Right-handed.  ? He will sometimes drink a soda.  ? ?Social Determinants of Health  ? ?Financial Resource Strain: Not on file  ?Food Insecurity: Not on file  ?Transportation Needs: Not on file  ?Physical Activity: Not on file  ?Stress: Not on file  ?Social Connections: Not on file  ? ? ?Hospital Course:  26 year old AA male with prior hx of depression, generalized anxiety disorder & oppositional disorder. He does have hx of seizure disorder as well. He was brought to the [facility] from the  [place] with complain of suicidal ideations with plans to buy a gun from online to kill himself.  ?HOSPITAL COURSE: ?  ?During the patient's hospitalization, patient had extensive initial psychiatric evaluation, and follow-up psychiatric evaluations every day. ?  ?Psychiatric diagnoses provided upon initial assessment:  ?-Brief psychotic disorder versus schizophrenia (if symptoms persisting for 6 months) versus substance-induced psychotic disorder (although the patient denies illicit substance use) ?-Major depressive disorder.  ?-Tobacco use disorder ?-Rule out alcohol use disorder ?  ?Diagnosis updated to : ?Principal  Problem ?Schizophrenia ?Active problems ?  Seizure disorder ?  MDD (major depressive disorder), recurrent episode, moderate ([place]) ?  Tobacco use disorder ?  Mild alcohol use disorder ?  ?Patient's psychiatric medications were adjusted on admission:  ?- Initiated Abilify 5 mg po daily for mood stabilization. ?- Started Hydroxyzine 25 mg po qid prn for anxiety.  ?- Initiated Citalopram 10 mg po daily for depression. ?- Changed Lamictal from 50 mg to 25 mg po daily (home medication).  ?- Continue Nicorette gum 2 mg Q 4 hrs while awake.  ?- Initiated Melatonin 5 mg po Q hs  for insomnia ?During the hospitalization, other adjustments were made to the patient's psychiatric medication regimen:  ?-Abilify discontinued.(Ineffective) ?-Zyprexa was initially discontinued and switched to risperidone due to LAI option with risperidone. Zyprexa restarted at low dose 2.70m BID ?- Continue to hold  risperidone due to stiffness ?- Started and Continued Cogentin 1 mg TID  for EPS. (CK trending down 178 normal at d/c) ?- Started and Continue Amantadine 1042mBID, for cogwheel rigidity ?- LAI considered but deferred  due to stiffness ?- Continued Hydroxyzine 25 mg po qid prn for anxiety.  ?- Started and Continued Lexapro 20 mg daily for depression. ?- Continued Lamictal  50 mg daily (home medication 25 mg).  ?- Continued Nicorette gum 2 mg as needed while awake.  ?- Continued Melatonin 5 mg po Q hs  for insomnia.  ?- First psychosis labs (vitamin B12 169), HIV NR, ESR WNL, RPR, ANA NR and ceruloplasmin low at 12.3 and CT head negative.  ?  ?Patient's care was discussed during the interdisciplinary team meeting every day during the hospitalization. ?  ?The patient denies having side effects to prescribed psychiatric medication. ?  ?Gradually, patient started adjusting to milieu. The patient was evaluated each day by a clinical provider to ascertain response to treatment. Improvement was noted by the patient's report of decreasing  symptoms, improved sleep and appetite, affect, medication tolerance, behavior, and participation in unit programming.  Patient was asked each day to complete a self inventory noting mood, mental status, pain, new symptoms, anxiety and concerns.   ?Symptoms were reported as significantly decreased or resolved completely by discharge.  ?The patient reports that their mood is stable.  ?The patient denied having suicidal thoughts for more than 48 hours prior to discharge.  Patient denies having homicidal thoughts.  Patient denies having auditory hallucinations.  Patient denies any visual hallucinations or other symptoms of psychosis.  ?The patient was motivated to continue taking medication with a goal of continued improvement in mental health.  ?  ?The patient reports their target psychiatric symptoms of Schizophrenia responded well to the psychiatric medications, and the patient reports overall benefit other psychiatric hospitalization. Supportive psychotherapy was provided to the patient. The patient also participated in regular group therapy while hospitalized. Coping skills, problem solving as well as relaxation therapies were also part of the unit programming. ?  ?Labs were reviewed with the patient,  and abnormal results were discussed with the patient. ?  ?The patient is able to verbalize their individual safety plan to this provider. ?  ?# It is recommended to the patient to continue psychiatric medications as prescribed, after discharge from the hospital.   ?  ?# It is recommended to the patient to follow up with your outpatient psychiatric provider and PCP. ?  ?# It was discussed with the patient, the impact of alcohol, drugs, tobacco have been there overall psychiatric and medical wellbeing, and total abstinence from substance use was recommended the patient.ed. ?  ?# Prescriptions provided or sent directly to preferred pharmacy at discharge. Patient agreeable to plan. Given opportunity to ask questions.  Appears to feel comfortable with discharge.  ?  ?# In the event of worsening symptoms, the patient is instructed to call the crisis hotline, 911 and or go to the nearest ED for appropriate evaluation and treatment of symptoms. To fol

## 2021-06-10 NOTE — Progress Notes (Signed)
Patient discharged this morning. Patient received all belongings including medication samples and paper prescriptions. Discharge instructions reviewed with patient. Patient verbalized understanding. Patient at discharge denies SI/HI/AVH. Patient left unit at 0815.  ?

## 2021-06-10 NOTE — Plan of Care (Signed)
Patient care plan completed per Provider. ?

## 2021-07-19 ENCOUNTER — Other Ambulatory Visit: Payer: Self-pay | Admitting: Neurology

## 2021-07-20 ENCOUNTER — Telehealth: Payer: Self-pay | Admitting: Neurology

## 2021-07-20 ENCOUNTER — Other Ambulatory Visit (HOSPITAL_COMMUNITY): Payer: Self-pay

## 2021-07-20 MED ORDER — LAMOTRIGINE 25 MG PO TABS: 50.0000 mg | ORAL_TABLET | Freq: Every day | ORAL | 3 refills | Status: AC

## 2021-07-20 MED ORDER — LAMOTRIGINE 25 MG PO TABS: 50.0000 mg | ORAL_TABLET | Freq: Every day | ORAL | 3 refills | Status: DC

## 2021-07-20 NOTE — Telephone Encounter (Signed)
Ok to send refills until f/u, thanks 

## 2021-07-20 NOTE — Telephone Encounter (Signed)
Patients mother called regarding her sons refills. York Spaniel he is out of his medicines, but she doesn't know what all he is on. He made an appt, since he didn't show for his dec 2022 appt.  [facility] on [address]

## 2021-07-20 NOTE — Telephone Encounter (Signed)
Refills sent in for pt to last until appointment pt called an informed refill was sent in

## 2021-07-20 NOTE — Addendum Note (Signed)
Addended by: Dimas Chyle on: 07/20/2021 03:29 PM   Modules accepted: Orders

## 2021-08-05 ENCOUNTER — Ambulatory Visit (HOSPITAL_COMMUNITY)
Admission: EM | Admit: 2021-08-05 | Discharge: 2021-08-05 | Disposition: A | Discharge status: Home / Self Care | Payer: No Payment, Other

## 2021-08-05 DIAGNOSIS — R45851 Suicidal ideations: Secondary | ICD-10-CM
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
## 2021-08-05 NOTE — Discharge Instructions (Signed)
Please come to [facility] (this facility, SECOND FLOOR) during walk in hours for appointment with psychiatrist/provider for further medication management and for therapists for therapy.   Walk-Ins for medication management are available on Monday, Wednesday, Thursday and Friday from 8am-11am.  It is first-come, first -serve; it is best to arrive by 7:00 AM.   Walk-Ins for therapy are available on Monday and Wednesdays 8am-11am. Every 1st and 2nd Friday 1PM-5PM. It is first come, first -serve; it is best to arrive by 7:00 AM.   When you arrive please go upstairs for your appointment. If you are unsure of where to go, inform the front desk that you are here for a walk in appointment and they will assist you with directions upstairs.  Address:  [address], in [place], [address] Ph: [phone]    Patient is instructed prior to discharge to: Take all medications as prescribed by his/her mental healthcare provider. Report any adverse effects and or reactions from the medicines to his/her outpatient provider promptly. Keep all scheduled appointments, to ensure that you are getting refills on time and to avoid any interruption in your medication.  If you are unable to keep an appointment call to reschedule.  Be sure to follow-up with resources and follow-up appointments provided.  Patient has been instructed & cautioned: To not engage in alcohol and or illegal drug use while on prescription medicines. In the event of worsening symptoms, patient is instructed to call the crisis hotline, 911 and or go to the nearest ED for appropriate evaluation and treatment of symptoms. To follow-up with his/her primary care provider for your other medical issues, concerns and or health care needs.

## 2021-08-29 ENCOUNTER — Encounter (HOSPITAL_COMMUNITY): Payer: Self-pay | Admitting: *Deleted

## 2021-08-29 ENCOUNTER — Other Ambulatory Visit: Payer: Self-pay

## 2021-08-29 ENCOUNTER — Emergency Department (HOSPITAL_COMMUNITY)
Admission: EM | Admit: 2021-08-29 | Discharge: 2021-08-29 | Disposition: A | Discharge status: Home / Self Care | Payer: Medicaid Other | Attending: Emergency Medicine | Admitting: Emergency Medicine

## 2021-08-29 DIAGNOSIS — R443 Hallucinations, unspecified: Secondary | ICD-10-CM | POA: Insufficient documentation

## 2021-08-29 MED ORDER — LORAZEPAM 0.5 MG PO TABS
0.5000 mg | ORAL_TABLET | Freq: Once | ORAL | Status: AC
  Administered 2021-08-29: 0.5 mg via ORAL
  Filled 2021-08-29: qty 1

## 2021-08-29 NOTE — ED Triage Notes (Signed)
Pt stated to feel funny after using a "Weed Pen" has done this in the past and felt the same way.

## 2021-08-29 NOTE — Discharge Instructions (Signed)
Return for any problem.  ?

## 2021-08-29 NOTE — ED Notes (Signed)
I called patient name for triage and no one responded

## 2021-08-29 NOTE — ED Provider Notes (Signed)
[facility]-EMERGENCY DEPT Provider Note   CSN: [id number] Arrival date & time: 08/29/21  1358     History  Chief Complaint  Patient presents with   Feeling Funny After Using a "weed pen"    Stephen [name] is a 26 y.o. male.  26 year old male with prior medical history as detailed below presents for evaluation.  Patient reports that he was with a body around lunch.  They decided to use a "weed pen" -patient reports that after using this apparent THC containing substance he felt funny.  He reports that he was seeing things.  He reports that he does not like the way he feels.  He reports that this has been a problem before when he has used marijuana/THC in the past.  Patient is without SI or HI.  Patient is calm and cooperative.  Patient reports that he does not plan on using THC or marijuana again in the future.  The history is provided by the patient and medical records.       Home Medications Prior to Admission medications   Medication Sig Start Date End Date Taking? Authorizing Provider  amantadine (SYMMETREL) 100 MG capsule Take 1 capsule (100 mg total) by mouth every 12 (twelve) hours. 06/09/21 07/09/21  Massengill, Harrold Donath, MD  benztropine (COGENTIN) 1 MG tablet Take 1 tablet (1 mg total) by mouth 3 (three) times daily before meals. 06/09/21 07/09/21  Massengill, Harrold Donath, MD  escitalopram (LEXAPRO) 20 MG tablet Take 1 tablet (20 mg total) by mouth daily. 06/10/21 07/10/21  Massengill, Harrold Donath, MD  lamoTRIgine (LAMICTAL) 25 MG tablet Take 2 tablets (50 mg total) by mouth at bedtime. 07/20/21 08/25/22  Van Clines, MD  multivitamin (RENA-VIT) TABS tablet Take 1 tablet by mouth at bedtime. 06/09/21   Massengill, Harrold Donath, MD  OLANZapine (ZYPREXA) 2.5 MG tablet Take 1 tablet (2.5 mg total) by mouth 2 (two) times daily. 06/09/21 07/09/21  Massengill, Harrold Donath, MD      Allergies    Lactose intolerance (gi)    Review of Systems   Review of Systems  All other systems  reviewed and are negative.   Physical Exam Updated Vital Signs BP 122/89 (BP Location: Right Arm)   Pulse 87   Temp 98.6 F (37 C) (Oral)   Resp 16   Ht 5\' 9"  (1.753 m)   Wt 72.6 kg   SpO2 97%   BMI 23.63 kg/m  Physical Exam Vitals and nursing note reviewed.  Constitutional:      General: He is not in acute distress.    Appearance: Normal appearance. He is well-developed.  HENT:     Head: Normocephalic and atraumatic.  Eyes:     Conjunctiva/sclera: Conjunctivae normal.     Pupils: Pupils are equal, round, and reactive to light.  Cardiovascular:     Rate and Rhythm: Normal rate and regular rhythm.     Heart sounds: Normal heart sounds.  Pulmonary:     Effort: Pulmonary effort is normal. No respiratory distress.     Breath sounds: Normal breath sounds.  Abdominal:     General: There is no distension.     Palpations: Abdomen is soft.     Tenderness: There is no abdominal tenderness.  Musculoskeletal:        General: No deformity. Normal range of motion.     Cervical back: Normal range of motion and neck supple.  Skin:    General: Skin is warm and dry.  Neurological:     General: No  focal deficit present.     Mental Status: He is alert and oriented to person, place, and time.     ED Results / Procedures / Treatments   Labs (all labs ordered are listed, but only abnormal results are displayed) Labs Reviewed - No data to display  EKG None  Radiology No results found.  Procedures Procedures    Medications Ordered in ED Medications  LORazepam (ATIVAN) tablet 0.5 mg (has no administration in time range)    ED Course/ Medical Decision Making/ A&P                           Medical Decision Making Risk Prescription drug management.    Medical Screen Complete  This patient presented to the ED with complaint of effect of THC.  This complaint involves an extensive number of treatment options. The initial differential diagnosis includes, but is not  limited to, drug effect, etc.  This presentation is: Acute, Self-Limited, Previously Undiagnosed, and Uncertain Prognosis  Parent presents after apparent reported use of THC containing substance.  Patient did not experience the euphoric high that he was expecting.  Patient reports mild hallucinations.  Patient was without indication or need for IVC.  He denies SI or HI.  He is alert and oriented x4.  He is comfortable and collected during evaluation.  He desires something to help him relax so that he can sleep through the effect of the  "Weed pen."  Patient is appropriate for discharge.  Patient does understand need for close outpatient follow-up.  Strict return precautions given and understood.  Additional history obtained:  External records from outside sources obtained and reviewed including prior ED visits and prior Inpatient records.   Medicines ordered:  I ordered medication including ativan  for sedation.  Reevaluation of the patient after these medicines showed that the patient: improved   Problem List / ED Course:  THC Use   Reevaluation:  After the interventions noted above, I reevaluated the patient and found that they have: improved Disposition:  After consideration of the diagnostic results and the patients response to treatment, I feel that the patent would benefit from close outpatient follow up.          Final Clinical Impression(s) / ED Diagnoses Final diagnoses:  Hallucinations    Rx / DC Orders ED Discharge Orders     None         Wynetta Fines, MD 08/29/21 1526

## 2021-08-30 ENCOUNTER — Ambulatory Visit (HOSPITAL_COMMUNITY)
Admission: RE | Admit: 2021-08-30 | Discharge: 2021-08-30 | Disposition: A | Discharge status: Home / Self Care | Payer: 59 | Attending: Psychiatry | Admitting: Psychiatry

## 2021-08-30 NOTE — H&P (Signed)
Behavioral Health Medical Screening Exam  Stephen [name] is an 26 y.o. male who presents to [facility] voluntarily for assessment. Patient is alone. States he smoked a vape pen yesterday and began having issues; mentions "drinking and vaping a lot", denies any ingestion today. When asked quantity, pt reports drinking one can of "[employer] 8%" yesterday. He endorses medication compliance; last took medications "last night", unable to state which medications. Patient presents withdrawn, slow to respond. Possible thought blocking, bizarre presentation. He initially denies any SI/HI/AVH. When asked what services he seeks, pt responds "I've been here before and I have been having thoughts to hurt myself and others with a knife"   Collateral: Karie Mainland (father) [phone] States pt lives with his mother and has not been a problem with him. Is going to call his mother for more information.   1713-Says pt's car is not working. He has seizures. Denies any knowledge of issues that led to pt presenting to [facility] Language barrier present. States, "check his chart you know he has mental problems that you guys give him medication for".   Update: [address] provided pt with dinner. While discussing need for COVID swab and admission process pt states he would like to leave. On reassessment pt states he was "thinking stupid earlier" and is denying any active thoughts of wanting to harm himself or anyone, auditory or visual hallucinations. He remains bizarre in presentation. When asked if he presented due to hunger he replied "yes", then thanked [facility] for dinner and requested to leave. Patient is voluntary and does not meet criteria at the time of this reassessment. Escorted out.   Total Time spent with patient: 20 minutes  Psychiatric Specialty Exam: Physical Exam Vitals and nursing note reviewed.  Constitutional:      General: He is not in acute distress.    Appearance: He is normal weight. He is not ill-appearing.  HENT:      Head: Normocephalic.     Nose: Nose normal.     Mouth/Throat:     Mouth: Mucous membranes are moist.     Pharynx: Oropharynx is clear.  Eyes:     Pupils: Pupils are equal, round, and reactive to light.  Cardiovascular:     Rate and Rhythm: Normal rate.     Pulses: Normal pulses.  Pulmonary:     Effort: Pulmonary effort is normal.  Abdominal:     Palpations: Abdomen is soft.  Musculoskeletal:        General: Normal range of motion.     Cervical back: Normal range of motion.  Skin:    General: Skin is dry.  Neurological:     Mental Status: He is alert and oriented to person, place, and time.  Psychiatric:        Attention and Perception: He is inattentive.        Mood and Affect: Affect is flat.        Speech: Speech is delayed.        Behavior: Behavior is cooperative.        Thought Content: Thought content is not paranoid or delusional. Thought content does not include homicidal or suicidal ideation. Thought content does not include homicidal or suicidal plan.        Judgment: Judgment is impulsive.    Review of Systems  Psychiatric/Behavioral:  Positive for decreased concentration. Negative for hallucinations, self-injury and suicidal ideas.   All other systems reviewed and are negative.  There were no vitals taken for this visit.There  is no height or weight on file to calculate BMI. General Appearance: Bizarre Eye Contact:  Fair Speech:  Blocked Volume:  Normal Mood:  Euthymic Affect:  Non-Congruent and Flat Thought Process:  Goal Directed and Descriptions of Associations: Loose Orientation:  Full (Time, Place, and Person) Thought Content:   concrete Suicidal Thoughts:  No Homicidal Thoughts:  No Memory:  Immediate;   Fair Recent;   Fair Judgement:  Other:  inconsistent Insight:  Shallow Psychomotor Activity:  Normal Concentration: Concentration: Fair and Attention Span: Fair Recall:  [employer] of Knowledge:Fair Language: Fair Akathisia:  NA Handed:   Right AIMS (if indicated):    Assets:  Financial Resources/Insurance Housing Physical Health Resilience Social Support Transportation Sleep:     Musculoskeletal: Strength & Muscle Tone: within normal limits Gait & Station: normal Patient leans: N/A  There were no vitals taken for this visit.  Recommendations: Based on my evaluation the patient does not appear to have an emergency medical condition. Patient requesting to leave to return home. Denies any SI/HI/AVH; referenced being hungry as reason for presentation.   Loletta Parish, NP 08/30/2021, 5:02 PM

## 2021-09-01 ENCOUNTER — Encounter (HOSPITAL_COMMUNITY): Payer: Self-pay

## 2021-09-01 ENCOUNTER — Other Ambulatory Visit: Payer: Self-pay

## 2021-09-01 ENCOUNTER — Emergency Department (HOSPITAL_COMMUNITY)
Admission: EM | Admit: 2021-09-01 | Discharge: 2021-09-01 | Discharge status: Against Medical Advice | Payer: Medicaid Other | Attending: Emergency Medicine | Admitting: Emergency Medicine

## 2021-09-01 DIAGNOSIS — R42 Dizziness and giddiness: Secondary | ICD-10-CM | POA: Insufficient documentation

## 2021-09-01 DIAGNOSIS — Z5321 Procedure and treatment not carried out due to patient leaving prior to being seen by health care provider: Secondary | ICD-10-CM | POA: Insufficient documentation

## 2021-09-01 DIAGNOSIS — F10129 Alcohol abuse with intoxication, unspecified: Secondary | ICD-10-CM | POA: Insufficient documentation

## 2021-09-01 NOTE — ED Notes (Signed)
I called patient for a room and no one responded 

## 2021-09-01 NOTE — ED Triage Notes (Signed)
Pt states that he is drunk and dizzy. Pt ambulatory.

## 2021-09-16 ENCOUNTER — Other Ambulatory Visit: Payer: Self-pay

## 2021-09-16 ENCOUNTER — Emergency Department (HOSPITAL_COMMUNITY)
Admission: EM | Admit: 2021-09-16 | Discharge: 2021-09-17 | Disposition: A | Discharge status: Home / Self Care | Payer: Self-pay | Attending: Emergency Medicine | Admitting: Emergency Medicine

## 2021-09-16 ENCOUNTER — Encounter (HOSPITAL_COMMUNITY): Payer: Self-pay | Admitting: *Deleted

## 2021-09-16 ENCOUNTER — Emergency Department (HOSPITAL_COMMUNITY): Payer: Self-pay

## 2021-09-16 DIAGNOSIS — R45851 Suicidal ideations: Secondary | ICD-10-CM | POA: Insufficient documentation

## 2021-09-16 DIAGNOSIS — M25561 Pain in right knee: Secondary | ICD-10-CM | POA: Diagnosis not present

## 2021-09-16 DIAGNOSIS — Z20822 Contact with and (suspected) exposure to covid-19: Secondary | ICD-10-CM | POA: Insufficient documentation

## 2021-09-16 DIAGNOSIS — R45850 Homicidal ideations: Secondary | ICD-10-CM | POA: Diagnosis not present

## 2021-09-16 DIAGNOSIS — F209 Schizophrenia, unspecified: Secondary | ICD-10-CM | POA: Diagnosis not present

## 2021-09-16 LAB — CBC WITH DIFFERENTIAL/PLATELET
Abs Immature Granulocytes: 0 10*3/uL (ref 0.00–0.07)
Basophils Absolute: 0 10*3/uL (ref 0.0–0.1)
Basophils Relative: 1 %
Eosinophils Absolute: 0.1 10*3/uL (ref 0.0–0.5)
Eosinophils Relative: 1 %
HCT: 43.1 % (ref 39.0–52.0)
Hemoglobin: 14.6 g/dL (ref 13.0–17.0)
Immature Granulocytes: 0 %
Lymphocytes Relative: 33 %
Lymphs Abs: 1.6 10*3/uL (ref 0.7–4.0)
MCH: 30.6 pg (ref 26.0–34.0)
MCHC: 33.9 g/dL (ref 30.0–36.0)
MCV: 90.4 fL (ref 80.0–100.0)
Monocytes Absolute: 0.4 10*3/uL (ref 0.1–1.0)
Monocytes Relative: 8 %
Neutro Abs: 2.7 10*3/uL (ref 1.7–7.7)
Neutrophils Relative %: 57 %
Platelets: 296 10*3/uL (ref 150–400)
RBC: 4.77 MIL/uL (ref 4.22–5.81)
RDW: 12.4 % (ref 11.5–15.5)
WBC: 4.7 10*3/uL (ref 4.0–10.5)
nRBC: 0.4 % — ABNORMAL HIGH (ref 0.0–0.2)

## 2021-09-16 LAB — COMPREHENSIVE METABOLIC PANEL
ALT: 18 U/L (ref 0–44)
AST: 27 U/L (ref 15–41)
Albumin: 4.3 g/dL (ref 3.5–5.0)
Alkaline Phosphatase: 79 U/L (ref 38–126)
Anion gap: 8 (ref 5–15)
BUN: 10 mg/dL (ref 6–20)
CO2: 28 mmol/L (ref 22–32)
Calcium: 9.4 mg/dL (ref 8.9–10.3)
Chloride: 102 mmol/L (ref 98–111)
Creatinine, Ser: 0.88 mg/dL (ref 0.61–1.24)
GFR, Estimated: >60 mL/min (ref >60)
Glucose, Bld: 103 mg/dL — ABNORMAL HIGH (ref 70–99)
Potassium: 3.9 mmol/L (ref 3.5–5.1)
Sodium: 138 mmol/L (ref 135–145)
Total Bilirubin: 0.7 mg/dL (ref 0.3–1.2)
Total Protein: 8.1 g/dL (ref 6.5–8.1)

## 2021-09-16 LAB — RESP PANEL BY RT-PCR (FLU A&B, COVID) ARPGX2
Influenza A by PCR: NEGATIVE
Influenza B by PCR: NEGATIVE
SARS Coronavirus 2 by RT PCR: NEGATIVE

## 2021-09-16 LAB — RAPID URINE DRUG SCREEN, HOSP PERFORMED
Amphetamines: NOT DETECTED
Barbiturates: NOT DETECTED
Benzodiazepines: NOT DETECTED
Cocaine: NOT DETECTED
Opiates: NOT DETECTED
Tetrahydrocannabinol: NOT DETECTED

## 2021-09-16 LAB — ETHANOL: Alcohol, Ethyl (B): <10 mg/dL (ref <10)

## 2021-09-16 MED ORDER — OLANZAPINE 5 MG PO TBDP
10.0000 mg | ORAL_TABLET | Freq: Three times a day (TID) | ORAL | Status: DC | PRN
  Administered 2021-09-16: 10 mg via ORAL
  Filled 2021-09-16: qty 2

## 2021-09-16 MED ORDER — ZIPRASIDONE MESYLATE 20 MG IM SOLR
20.0000 mg | INTRAMUSCULAR | Status: AC | PRN
  Administered 2021-09-16: 20 mg via INTRAMUSCULAR
  Filled 2021-09-16: qty 20

## 2021-09-16 MED ORDER — LORAZEPAM 1 MG PO TABS: 1.0000 mg | ORAL_TABLET | ORAL | Status: DC | PRN

## 2021-09-16 MED ORDER — LAMOTRIGINE 25 MG PO TABS
50.0000 mg | ORAL_TABLET | Freq: Every day | ORAL | Status: DC
  Administered 2021-09-16: 50 mg via ORAL
  Filled 2021-09-16: qty 2

## 2021-09-16 NOTE — ED Notes (Signed)
Pt ran out of room and into the [facility] ER.

## 2021-09-16 NOTE — ED Notes (Signed)
IVC PAPERS HAVE BEEN COMPLETE

## 2021-09-16 NOTE — ED Notes (Addendum)
Spoke with pt's mother on phone and gave pt's mother update.

## 2021-09-16 NOTE — BH Assessment (Signed)
@  1954, requested patient's nurse Corrie Dandy, RN to set up the TTS machine for patient's initial TTS assessment.

## 2021-09-16 NOTE — ED Triage Notes (Signed)
The pt arrived by gems from [employer] [facility] [employer]  he has said both places  he is si and hi.  He told the paramedic in the back of the ambulance that he was going to hurt her  god was called butg he made no attempt  and the police was seny away when he arrived her\e.  He reports that he wants to kill people he does not know  aldo he plans to cut himself  to kill himself  he reports that he does not have a KNIFE ON HIM

## 2021-09-16 NOTE — BH Assessment (Addendum)
@  9767, received and update from patient's nurse Corrie Dandy, RN) that patient keeps trying to elope at the moment and is being uncooperative. Clinician requested that she notify TTS when patient's ready to be assessed.
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
## 2021-09-16 NOTE — ED Notes (Signed)
The pt has been dressed in scrubs and wanded by security

## 2021-09-16 NOTE — ED Notes (Signed)
Sitter requested for pt.

## 2021-09-16 NOTE — ED Notes (Signed)
Dinner tray ordered for no pork.

## 2021-09-16 NOTE — ED Notes (Signed)
The pt reports that he had rt knee pain also for 2 weeks
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
## 2021-09-16 NOTE — ED Notes (Signed)
Stephen [name] returned to room by ER security guards.

## 2021-09-17 DIAGNOSIS — F209 Schizophrenia, unspecified: Secondary | ICD-10-CM

## 2021-09-17 MED ORDER — OLANZAPINE 2.5 MG PO TABS
2.5000 mg | ORAL_TABLET | Freq: Two times a day (BID) | ORAL | Status: DC
  Administered 2021-09-17: 2.5 mg via ORAL
  Filled 2021-09-17: qty 1

## 2021-09-17 MED ORDER — BENZTROPINE MESYLATE 1 MG PO TABS
1.0000 mg | ORAL_TABLET | Freq: Three times a day (TID) | ORAL | Status: DC
  Administered 2021-09-17: 1 mg via ORAL
  Filled 2021-09-17: qty 1

## 2021-09-17 MED ORDER — ESCITALOPRAM OXALATE 10 MG PO TABS
20.0000 mg | ORAL_TABLET | Freq: Every day | ORAL | Status: DC
  Administered 2021-09-17: 20 mg via ORAL
  Filled 2021-09-17: qty 2

## 2021-09-17 MED ORDER — OLANZAPINE 5 MG PO TBDP
5.0000 mg | ORAL_TABLET | Freq: Once | ORAL | Status: DC
Start: 2021-09-17 — End: 2021-09-17

## 2021-09-17 NOTE — ED Notes (Signed)
Pt mother called at this time. Pt mother requesting to see pt. Visiting hours permitted at 1230. Pt mother informed to return than for visitation.

## 2021-09-17 NOTE — BH Assessment (Signed)
Clinician made contact with pt's nurse, Jesse Sans, in regards to completing pt's MH Assessment. Pt's nurse stated pt has been agitated and attempting to elope but finally fell asleep; it was agreed that it would be best to allow pt to sleep at this time and do the assessment if he wakes up and is cooperative or in the morning after he's had some sleep.

## 2021-09-17 NOTE — Discharge Instructions (Signed)
For your behavioral health needs you are advised to follow up with [facility] at your earliest opportunity:      [facility]      [address] SECOND FLOOR      [place], [place] [address]      [phone]      They offer psychiatry/medication management and therapy.  New patients are seen in their walk-in clinic.  Walk-in hours are Monday, Wednesday, Thursday and Friday from 8:00 am - 11:00 am for psychiatry, and Monday and Wednesday from 8:00 am - 11:00 am for therapy.  Walk-in patients are seen on a first come, first served basis, so try to arrive as early as possible for the best chance of being seen the same day.  BE SURE TO TAKE THE ELEVATOR TO THE SECOND FLOOR.  Please note that to be eligible for services you must bring an ID or a piece of mail with your name and a [place] address.  

## 2021-09-17 NOTE — BH Assessment (Signed)
Per Dr. Kumar in the 9am morning huddle/bed meeting, Provider to assess. No TTS [facility] is needed at this time. 

## 2021-09-17 NOTE — BH Assessment (Signed)
[facility] Assessment Progress Note   Per Ophelia Shoulder, NP, this pt does not require psychiatric hospitalization at this time.  Pt is psychiatrically cleared.  Discharge instructions include referral information for [facility].  Pt's nurse, Clayborne Dana, has been notified.  Doylene Canning, MA Triage Specialist [phone]

## 2021-09-17 NOTE — BH Assessment (Signed)
Pt has been IVCed; his IVC paperwork states:  "Pt has schizophrenia, now having active suicidal and homicidal thoughts. He has stated repeatedly he wants to hurt himself and others with a knife."  Dr. Marianna Fuss, EDP/Petitioner [phone]

## 2021-09-17 NOTE — Consult Note (Signed)
Telepsych Consultation   Reason for Consult:  Psychiatric Reassessment Referring Physician:  Milagros Loll, MD Location of Patient:    [facility] Location of Provider: Other: virtual home office  Patient Identification: Stephen [name] MRN:  [id number] Principal Diagnosis: Schizophrenia (HCC) Diagnosis:  Principal Problem:   Schizophrenia (HCC)   Total Time spent with patient: 20 minutes  Subjective:   Stephen [name] is a 26 y.o. male patient admitted with suicidal and homicidal ideations.  Pt has hx of schizophrenia and has been off his medication for undeterminate amount of time.  HPI:   Patient seen via telepsych by this provider; chart reviewed and consulted with Dr. Lucianne Muss on 09/17/21.  On evaluation Stephen [name] reports he feels better today since restarting his medication and resting.  He no longer endorse SI or HI and denies AVH.    He presented to the emergency department for evaluation of knee pain and reported suicidal and homicidal ideations..  He denies triggers states this happens when he does not take medications. Symptoms present on and off for a few days, no direct target for HI.  Lives with his mother and reports good relationship with her.  Has dx of schizophrenia but does not always take his medications.  Pt reports his symptoms improve with medication compliance.  Currently, pt is clear and coherent , somewhat withdrawn but otherwise appropriately engaged in the interview.  Pt was asleep prior to starting the assessment and that is taken into consideration as well.    On admissions, pt was restarted on home medications and has tolerated the well, no involuntary movements seen.  He reports sleep and appetite are good.  Labs were reviewed for medical contribution to stated symptoms--pt has been medically cleared.  At present, pt is alert and oriented x 4; speaks in a low tone but clearly states his name, location and date.  No longer endorses suicidal or homicidal  ideations; denies audible or visual hallucinations.  Per ED Provider Admissions Assessment 09/16/2021: No chief complaint on file.    Stephen [name] is a 26 y.o. male.  Presented to ER due to concern for right knee pain.  Patient states that he hit his right knee against a car.  Has been able to walk but is having some pain while walking.  Denies any other traumatic injuries.  He reports SI and HI.  Additionally known history of schizophrenia.   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   Depression    Seizures (HCC)     Past Surgical History:  Procedure Laterality Date   CIRCUMCISION     SHOULDER CLOSED REDUCTION Right 12/08/2018   Procedure: CLOSED REDUCTION SHOULDER;  Surgeon: Yolonda Kida, MD;  Location: [facility] OR;  Service: Orthopedics;  Laterality: Right;   Family History:  Family History  Problem Relation Age of Onset   Diabetes gravidarum Paternal Grandmother    Heart attack Paternal Grandmother    Healthy Mother    Healthy Father    Family Psychiatric  History: unknown Social History:  Social History   Substance and Sexual Activity  Alcohol Use Yes   Alcohol/week: 3.0 standard drinks of alcohol   Types: 3 Cans of beer per week   Comment: beer today with "weed pen"  Social History   Substance and Sexual Activity  Drug Use Yes   Types: Marijuana    Social History   Socioeconomic History   Marital status: Single    Spouse name: Not on file   Number of children: 0   Years of education: Currently in college   Highest education level: Not on file  Occupational History   Occupation: Consulting civil engineer at [employer]  Tobacco Use   Smoking status: Every Day    Types: Cigarettes   Smokeless tobacco: Never  Vaping Use   Vaping Use: Every day    Substances: Nicotine  Substance and Sexual Activity   Alcohol use: Yes    Alcohol/week: 3.0 standard drinks of alcohol    Types: 3 Cans of beer per week    Comment: beer today with "weed pen"   Drug use: Yes    Types: Marijuana   Sexual activity: Not on file  Other Topics Concern   Not on file  Social History Narrative   Stephen [name] is attending [employer]. He is Glass blower/designer in Primary school teacher.   Living with his father, step-mother, and siblings.   Right-handed.   He will sometimes drink a soda.   Social Determinants of Health   Financial Resource Strain: Not on file  Food Insecurity: Not on file  Transportation Needs: Not on file  Physical Activity: Not on file  Stress: Not on file  Social Connections: Not on file   Additional Social History:    Allergies:   Allergies  Allergen Reactions   Lactose Intolerance (Gi) Diarrhea    Labs:  Results for orders placed or performed during the hospital encounter of 09/16/21 (from the past 48 hour(s))  Urine rapid drug screen (hosp performed)     Status: None   Collection Time: 09/16/21  3:46 PM  Result Value Ref Range   Opiates NONE DETECTED NONE DETECTED   Cocaine NONE DETECTED NONE DETECTED   Benzodiazepines NONE DETECTED NONE DETECTED   Amphetamines NONE DETECTED NONE DETECTED   Tetrahydrocannabinol NONE DETECTED NONE DETECTED   Barbiturates NONE DETECTED NONE DETECTED    Comment: (NOTE) DRUG SCREEN FOR MEDICAL PURPOSES ONLY.  IF CONFIRMATION IS NEEDED FOR ANY PURPOSE, NOTIFY LAB WITHIN 5 DAYS.  LOWEST DETECTABLE LIMITS FOR URINE DRUG SCREEN Drug Class                     Cutoff (ng/mL) Amphetamine and metabolites    1000 Barbiturate and metabolites    200 Benzodiazepine                 200 Tricyclics and metabolites     300 Opiates and metabolites        300 Cocaine and metabolites        300 THC                            50 Performed at [facility], [address] [place], [place] [address]   Resp Panel by RT-PCR  (Flu A&B, Covid) Anterior Nasal Swab     Status: None   Collection Time: 09/16/21  3:50 PM   Specimen: Anterior Nasal Swab  Result Value Ref Range   SARS Coronavirus 2 by RT PCR NEGATIVE NEGATIVE    Comment: (NOTE) SARS-CoV-2 target nucleic acids are NOT DETECTED.  The SARS-CoV-2 RNA is generally detectable in upper respiratory specimens during the acute phase of infection. The lowest concentration of SARS-CoV-2 viral copies  this assay can detect is 138 copies/mL. A negative result does not preclude SARS-Cov-2 infection and should not be used as the sole basis for treatment or other patient management decisions. A negative result may occur with  improper specimen collection/handling, submission of specimen other than nasopharyngeal swab, presence of viral mutation(s) within the areas targeted by this assay, and inadequate number of viral copies(<138 copies/mL). A negative result must be combined with clinical observations, patient history, and epidemiological information. The expected result is Negative.  Fact Sheet for Patients:  BloggerCourse.comhttps://www.fda.gov/media/152166/download  Fact Sheet for Healthcare Providers:  SeriousBroker.ithttps://www.fda.gov/media/152162/download  This test is no t yet approved or cleared by the [place] FDA and  has been authorized for detection and/or diagnosis of SARS-CoV-2 by FDA under an Emergency Use Authorization (EUA). This EUA will remain  in effect (meaning this test can be used) for the duration of the COVID-19 declaration under Section 564(b)(1) of the Act, 21 U.S.C.section 360bbb-3(b)(1), unless the authorization is terminated  or revoked sooner.       Influenza A by PCR NEGATIVE NEGATIVE   Influenza B by PCR NEGATIVE NEGATIVE    Comment: (NOTE) The Xpert Xpress SARS-CoV-2/FLU/RSV plus assay is intended as an aid in the diagnosis of influenza from Nasopharyngeal swab specimens and should not be used as a sole basis for treatment. Nasal washings  and aspirates are unacceptable for Xpert Xpress SARS-CoV-2/FLU/RSV testing.  Fact Sheet for Patients: BloggerCourse.comhttps://www.fda.gov/media/152166/download  Fact Sheet for Healthcare Providers: SeriousBroker.ithttps://www.fda.gov/media/152162/download  This test is not yet approved or cleared by the [place] FDA and has been authorized for detection and/or diagnosis of SARS-CoV-2 by FDA under an Emergency Use Authorization (EUA). This EUA will remain in effect (meaning this test can be used) for the duration of the COVID-19 declaration under Section 564(b)(1) of the Act, 21 U.S.C. section 360bbb-3(b)(1), unless the authorization is terminated or revoked.  Performed at [facility], [address] [place], [place] [address]   Comprehensive metabolic panel     Status: Abnormal   Collection Time: 09/16/21  4:22 PM  Result Value Ref Range   Sodium 138 135 - 145 mmol/L   Potassium 3.9 3.5 - 5.1 mmol/L   Chloride 102 98 - 111 mmol/L   CO2 28 22 - 32 mmol/L   Glucose, Bld 103 (H) 70 - 99 mg/dL    Comment: Glucose reference range applies only to samples taken after fasting for at least 8 hours.   BUN 10 6 - 20 mg/dL   Creatinine, Ser 6.040.88 0.61 - 1.24 mg/dL   Calcium 9.4 8.9 - 54.010.3 mg/dL   Total Protein 8.1 6.5 - 8.1 g/dL   Albumin 4.3 3.5 - 5.0 g/dL   AST 27 15 - 41 U/L   ALT 18 0 - 44 U/L   Alkaline Phosphatase 79 38 - 126 U/L   Total Bilirubin 0.7 0.3 - 1.2 mg/dL   GFR, Estimated >98>60 >11>60 mL/min    Comment: (NOTE) Calculated using the CKD-EPI Creatinine Equation (2021)    Anion gap 8 5 - 15    Comment: Performed at [facility], [address] [place], [place] [address]  Ethanol     Status: None   Collection Time: 09/16/21  4:22 PM  Result Value Ref Range   Alcohol, Ethyl (B) <10 <10 mg/dL    Comment: (NOTE) Lowest detectable limit for serum alcohol is 10 mg/dL.  For medical purposes only. Performed at [facility], [address] [place],  [place] [address]   CBC with  Diff     Status: Abnormal   Collection Time: 09/16/21  4:22 PM  Result Value Ref Range   WBC 4.7 4.0 - 10.5 K/uL   RBC 4.77 4.22 - 5.81 MIL/uL   Hemoglobin 14.6 13.0 - 17.0 g/dL   HCT 53.6 14.4 - 31.5 %   MCV 90.4 80.0 - 100.0 fL   MCH 30.6 26.0 - 34.0 pg   MCHC 33.9 30.0 - 36.0 g/dL   RDW 40.0 86.7 - 61.9 %   Platelets 296 150 - 400 K/uL   nRBC 0.4 (H) 0.0 - 0.2 %   Neutrophils Relative % 57 %   Neutro Abs 2.7 1.7 - 7.7 K/uL   Lymphocytes Relative 33 %   Lymphs Abs 1.6 0.7 - 4.0 K/uL   Monocytes Relative 8 %   Monocytes Absolute 0.4 0.1 - 1.0 K/uL   Eosinophils Relative 1 %   Eosinophils Absolute 0.1 0.0 - 0.5 K/uL   Basophils Relative 1 %   Basophils Absolute 0.0 0.0 - 0.1 K/uL   Immature Granulocytes 0 %   Abs Immature Granulocytes 0.00 0.00 - 0.07 K/uL    Comment: Performed at [facility], [address], [place], [place] [address]    Medications:  No current facility-administered medications for this encounter.   Current Outpatient Medications  Medication Sig Dispense Refill   lamoTRIgine (LAMICTAL) 25 MG CHEW chewable tablet Chew 50 mg by mouth at bedtime.     amantadine (SYMMETREL) 100 MG capsule Take 1 capsule (100 mg total) by mouth every 12 (twelve) hours. (Patient not taking: Reported on 09/16/2021) 60 capsule 0   benztropine (COGENTIN) 1 MG tablet Take 1 tablet (1 mg total) by mouth 3 (three) times daily before meals. (Patient not taking: Reported on 09/16/2021) 90 tablet 0   escitalopram (LEXAPRO) 20 MG tablet Take 1 tablet (20 mg total) by mouth daily. (Patient not taking: Reported on 09/16/2021) 30 tablet 0   lamoTRIgine (LAMICTAL) 25 MG tablet Take 2 tablets (50 mg total) by mouth at bedtime. (Patient not taking: Reported on 09/16/2021) 60 tablet 3   multivitamin (RENA-VIT) TABS tablet Take 1 tablet by mouth at bedtime. (Patient not taking: Reported on 09/16/2021)  0   OLANZapine (ZYPREXA) 2.5 MG tablet Take 1 tablet (2.5 mg total) by mouth 2 (two) times daily.  (Patient not taking: Reported on 09/16/2021) 60 tablet 0    Musculoskeletal: pt moves all extremities and ambulates independently.  Strength & Muscle Tone: within normal limits Gait & Station: normal Patient leans: N/A  Psychiatric Specialty Exam:  Presentation  General Appearance: Appropriate for Environment  Eye Contact:Fair  Speech:Clear and Coherent  Speech Volume:Decreased  Handedness:Right   Mood and Affect  Mood:Anxious  Affect:Appropriate; Congruent   Thought Process  Thought Processes:Coherent; Goal Directed  Descriptions of Associations:Intact  Orientation:Full (Time, Place and Person) (clearly states his name, hospital name and date)  Thought Content:Logical  History of Schizophrenia/Schizoaffective disorder:Yes  Duration of Psychotic Symptoms:Greater than six months  Hallucinations:Hallucinations: None  Ideas of Reference:None  Suicidal Thoughts:Suicidal Thoughts: No (has improved since admissions and restarting meds)  Homicidal Thoughts:Homicidal Thoughts: No   Sensorium  Memory:Immediate Good; Recent Good; Remote Good  Judgment:Fair (at baseline as he does not take meds regularly)  Insight:Fair   Executive Functions  Concentration:Fair  Attention Span:Fair  Recall:Good  Fund of Knowledge:Good  Language:Good   Psychomotor Activity  Psychomotor Activity:Psychomotor Activity: Normal   Assets  Assets:Communication Skills; Housing; Surveyor, quantity  Resources/Insurance; Social Support (lives with his mother)   Sleep  Sleep:Sleep: Good Number of Hours of Sleep: 7    Physical Exam: Physical Exam Constitutional:      Appearance: Normal appearance.  Cardiovascular:     Rate and Rhythm: Normal rate.     Pulses: Normal pulses.  Pulmonary:     Effort: Pulmonary effort is normal.  Musculoskeletal:        General: Normal range of motion.     Cervical back: Normal range of motion.  Neurological:     Mental Status: He is alert and  oriented to person, place, and time. Mental status is at baseline.  Psychiatric:        Attention and Perception: Attention and perception normal.        Mood and Affect: Mood normal. Affect is flat.        Speech: Speech normal.        Behavior: Behavior is withdrawn (but participates in assessment; appears more sleepy). Behavior is cooperative.        Thought Content: Thought content normal. Thought content is not paranoid or delusional. Thought content does not include homicidal or suicidal ideation. Thought content does not include homicidal or suicidal plan.        Cognition and Memory: Cognition and memory normal.    Review of Systems  Constitutional: Negative.   HENT: Negative.    Eyes: Negative.   Respiratory: Negative.    Cardiovascular: Negative.   Gastrointestinal: Negative.   Genitourinary: Negative.   Musculoskeletal: Negative.   Skin: Negative.   Neurological: Negative.   Endo/Heme/Allergies: Negative.   Psychiatric/Behavioral: Negative.  Negative for suicidal ideas (has improved since restarting medications and getting rest).    Blood pressure 113/80, pulse 70, temperature 98.4 F (36.9 C), temperature source Oral, resp. rate 18, height 5\' 9"  (1.753 m), weight 72.6 kg, SpO2 98 %. Body mass index is 23.64 kg/m.  Treatment Plan Summary: Patient who presented with mental decline, and suicidal and homicidal ideations in the setting of medication non-adherence.  Since restarting this medications, pt demonstrates symptomatic improvement.  SI and HI have resolved and he denies AVH.  Pt is clear and coherent and able to appropriately verbalize his needs.  He is appropriately groomed in hospital scrubs ,does not appear disheveled and he does not appear to be responding to internal stimulus.     Plan- As per above assessment, there are no current grounds for involuntary commitment at this time.  Patient is not currently interested in inpatient services but expresses agreement to  continue outpatient treatment., we have reviewed the importance medication compliance. Recommend he continues home psychiatric medications and f/u with outpatient psychiatry for medication management.    Disposition: No evidence of imminent risk to self or others at present.   Patient does not meet criteria for psychiatric inpatient admission. Supportive therapy provided about ongoing stressors. Discussed crisis plan, support from social network, calling 911, coming to the Emergency Department, and calling Suicide Hotline.  This service was provided via telemedicine using a 2-way, interactive audio and video technology.  Spoke with Dr. , EDP; Manus Gunning, LCSW; Doylene Canning, RN informed of above recommendation and disposition via secure messaging.  Names of all persons participating in this telemedicine service and their role in this encounter. Name: Tyrece Vanterpool Role: Patient  Name: [name] Role: PMHNP   Ophelia Shoulder, NP 09/17/2021 9:16 PM

## 2021-09-17 NOTE — ED Provider Notes (Addendum)
Emergency Medicine Observation Re-evaluation Note  Stephen [name] is a 26 y.o. male, seen on rounds today.  Pt initially presented to the ED for complaints of No chief complaint on file. Currently, the patient is resting comfortably.  Physical Exam  BP 133/80 (BP Location: Right Arm)   Pulse 95   Temp 98.8 F (37.1 C) (Oral)   Resp 19   Ht 5\' 9"  (1.753 m)   Wt 72.6 kg   SpO2 99%   BMI 23.64 kg/m  Physical Exam General: Asleep, arousable Cardiac: Regular rate Lungs: No respiratory distress Psych: Drowsy affect  ED Course / MDM  EKG:EKG Interpretation  Date/Time:  Wednesday September 16 2021 16:23:43 EDT Ventricular Rate:  90 PR Interval:  132 QRS Duration: 90 QT Interval:  342 QTC Calculation: 418 R Axis:   81 Text Interpretation: Normal sinus rhythm Normal ECG When compared with ECG of 26-May-2021 16:12, PREVIOUS ECG IS PRESENT Confirmed by 28-May-2021 (Marianna Fuss) on 09/16/2021 5:22:31 PM  I have reviewed the labs performed to date as well as medications administered while in observation.  Recent changes in the last 24 hours include arrived in ER, received IM for trying to elope.  Plan  Current plan is for psychiatric evaluation, as patient unable to be assessed formally yesterday. 11/16/2021 is under involuntary commitment.      [name], MD 09/17/21 [phone]   12:42 PM Patient re-evaluated by psychiatry. No longer endorsing SI, HI, or having AVH. Psychiatrically cleared per psychiatry team. Will rescind IVC. Will discharge patient to home. All questions answered. Patient comfortable with plan of discharge. Return precautions discussed with patient and specified on the after visit summary.    1638, MD 09/17/21 1242

## 2021-09-17 NOTE — ED Notes (Signed)
Pt provided all belongings, written and verbal d/c instructions. Pt verbalizes understanding. Pt family at bedside. Pt l;eft dept. Via ambulation with family.

## 2021-11-03 IMAGING — DX DG SHOULDER 2+V*R*
3 series · 3 of 3 positions shown · non-contrast
Comparison: 04/17/2019

CLINICAL DATA: Recent injury, history of dislocations

EXAM:
RIGHT SHOULDER - 2+ VIEW

[shoulder grashey]
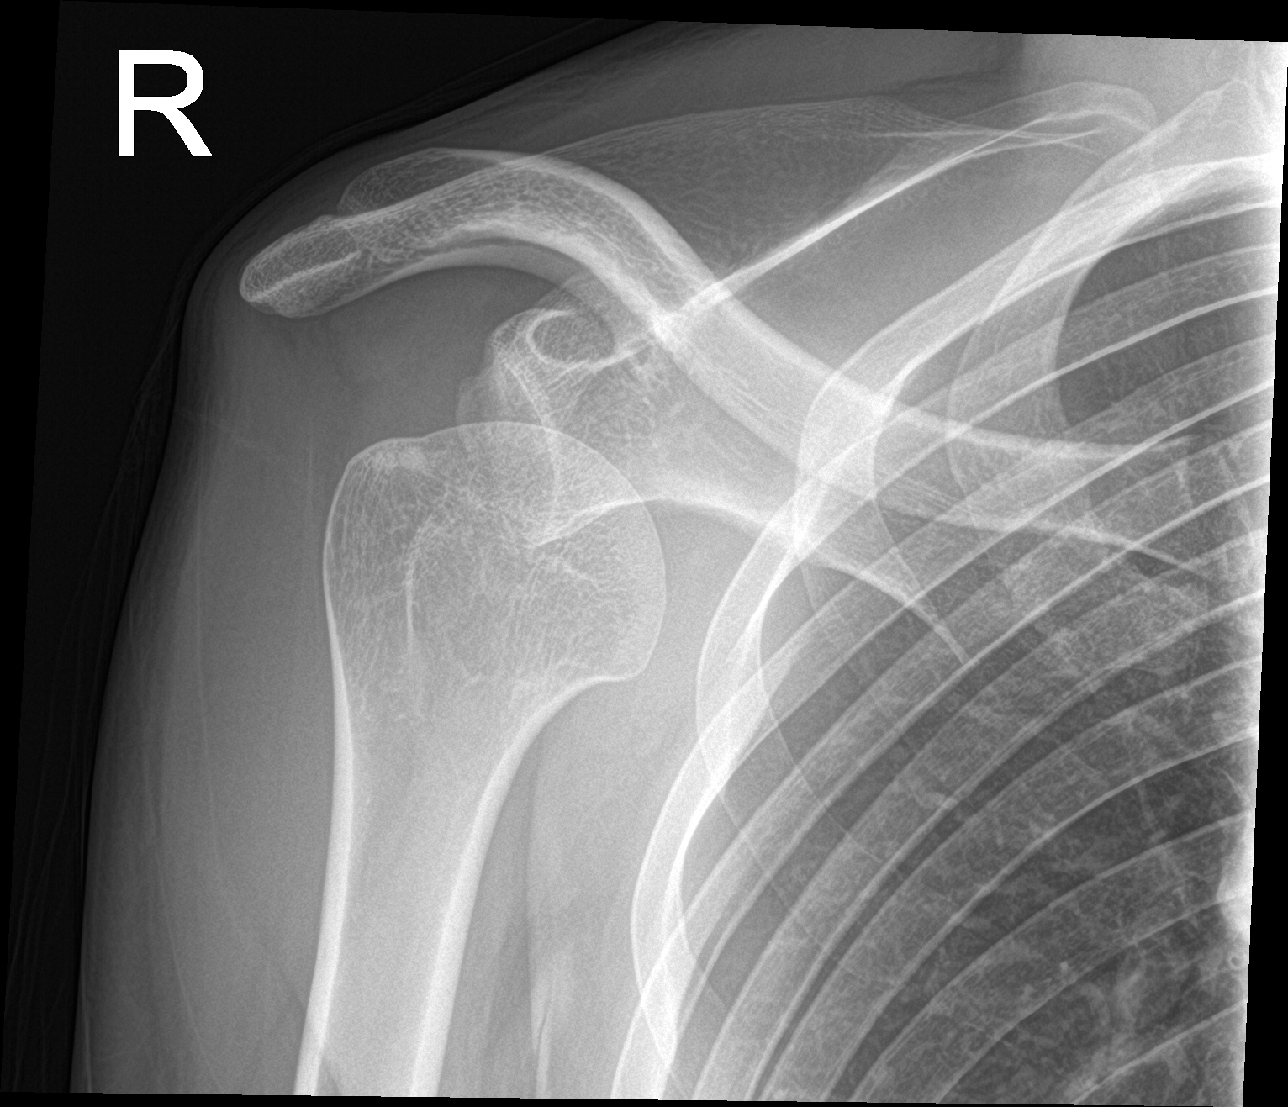

[shoulder y view]
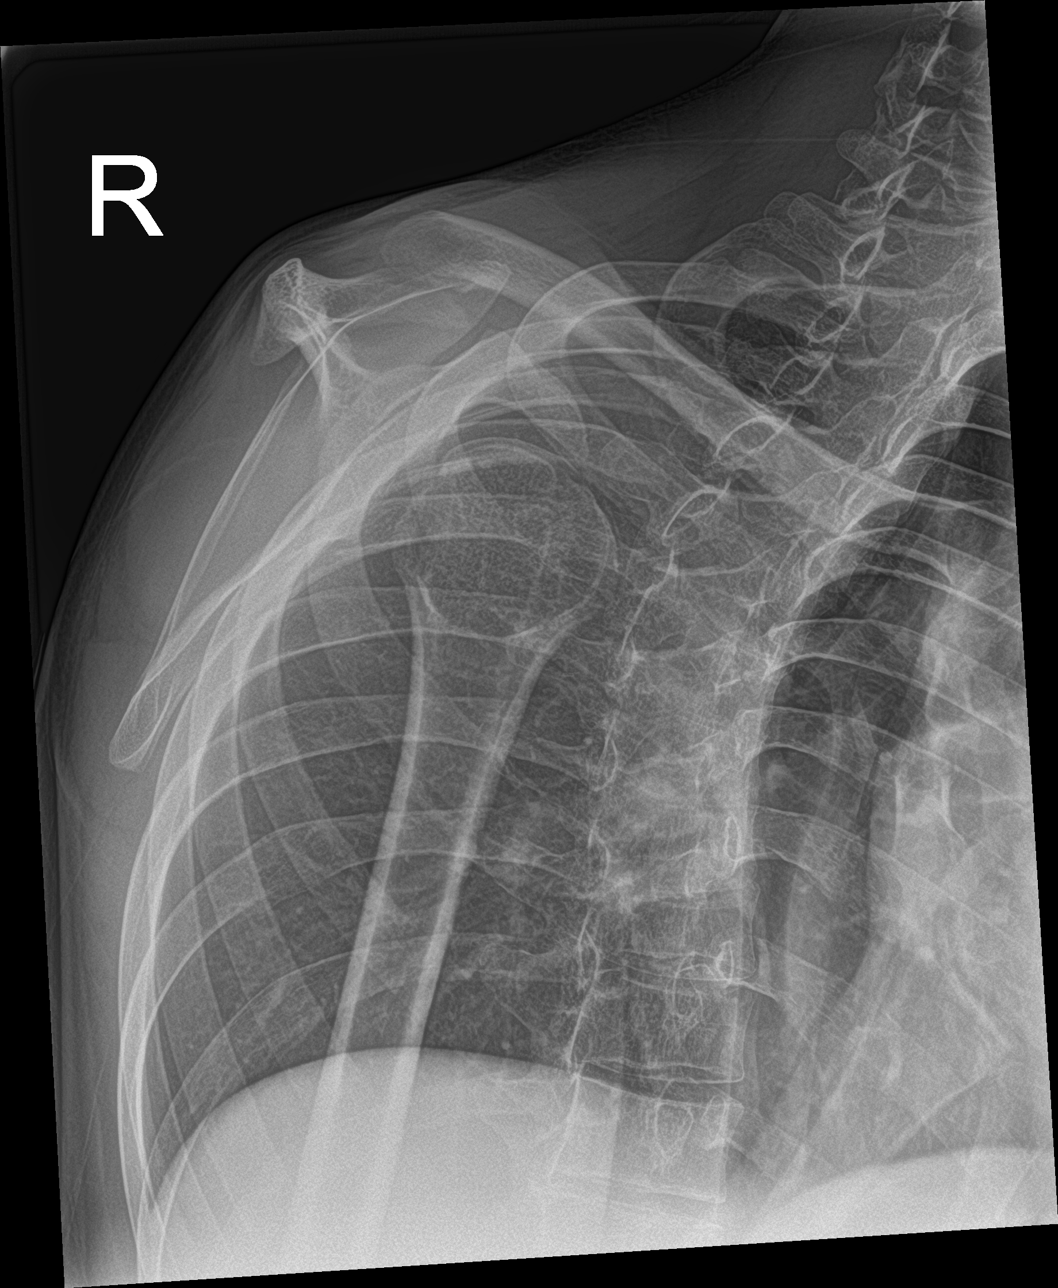

[shoulder axillary]
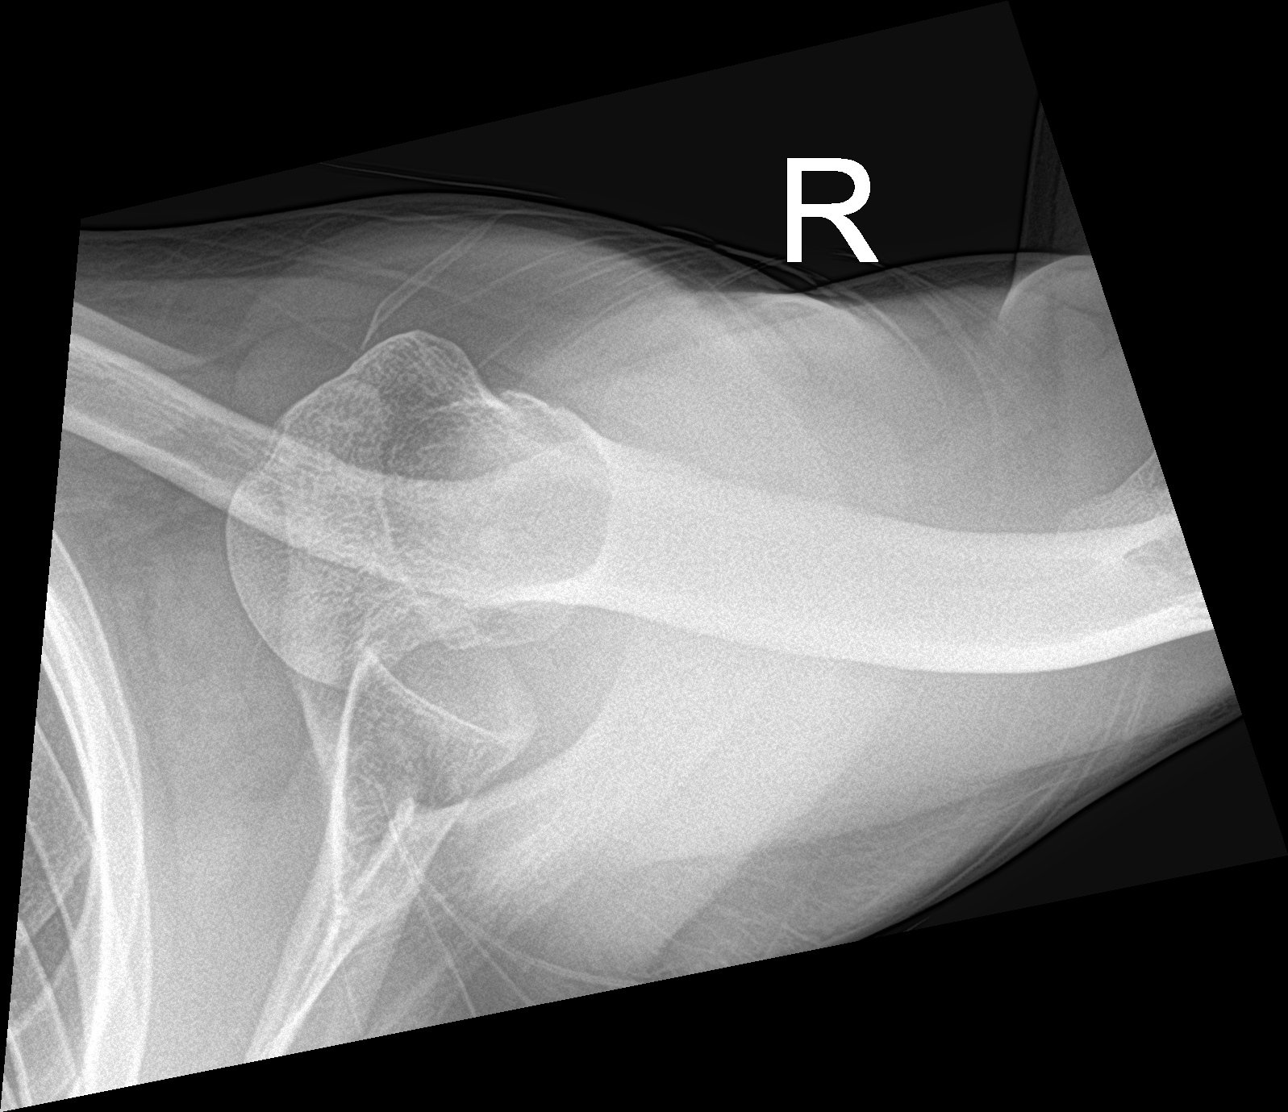

[3 of 3 positions shown; findings below may reference images not displayed]

FINDINGS: Anterior inferior dislocation of the humeral head is noted with
respect to the glenoid. No definitive fracture is seen. No soft
tissue abnormality is noted.
IMPRESSION: Anterior inferior dislocation of the right humeral head.

## 2021-12-11 ENCOUNTER — Encounter: Payer: Self-pay | Admitting: Neurology

## 2021-12-11 ENCOUNTER — Ambulatory Visit: Payer: Commercial Managed Care - HMO | Admitting: Neurology

## 2021-12-11 DIAGNOSIS — Z0279 Encounter for issue of other medical certificate: Secondary | ICD-10-CM

## 2022-01-18 IMAGING — DX DG SHOULDER 2+V*R*
2 series · 2 of 2 positions shown · non-contrast
Comparison: Earlier films, same date.

CLINICAL DATA: Reduction of shoulder dislocation.

EXAM:
RIGHT SHOULDER - 2+ VIEW

[shoulder obl]
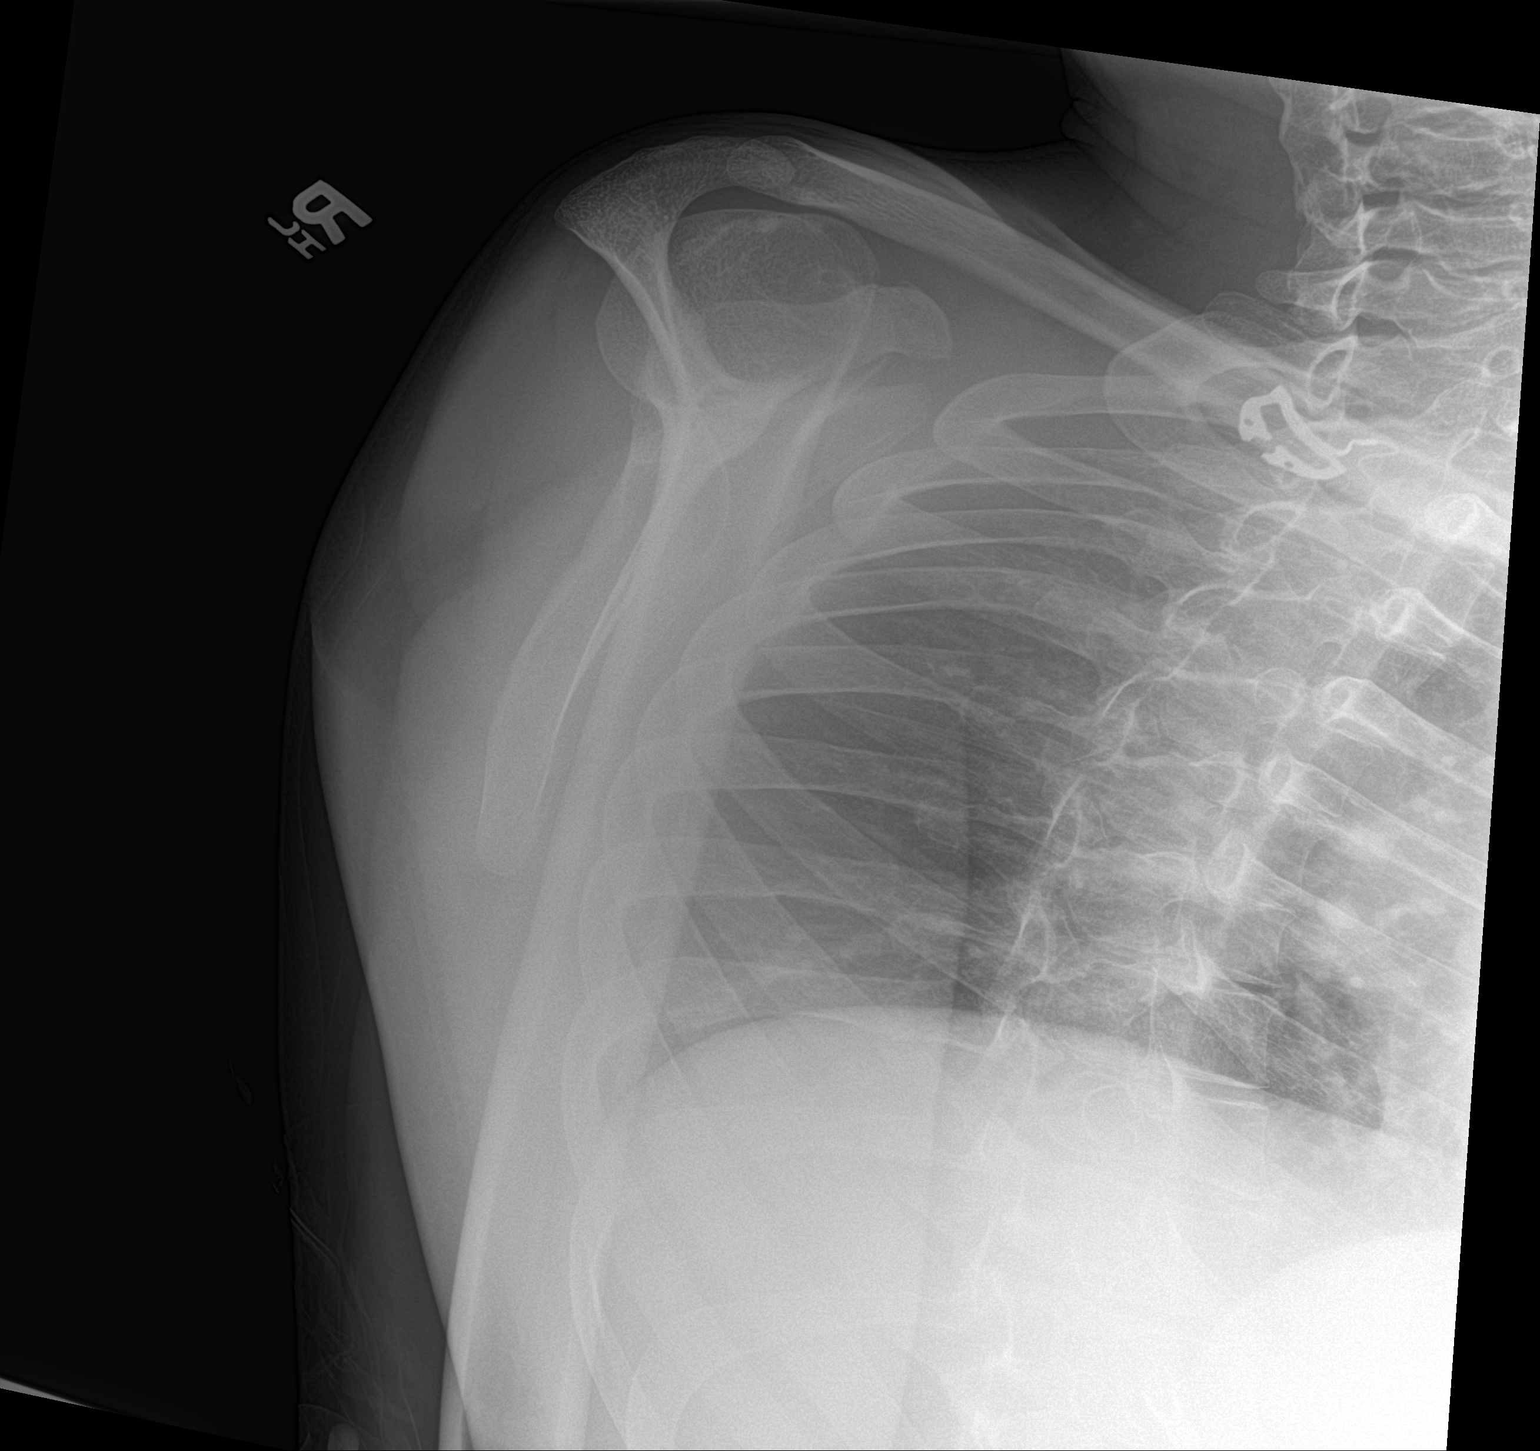

[shoulder ap]
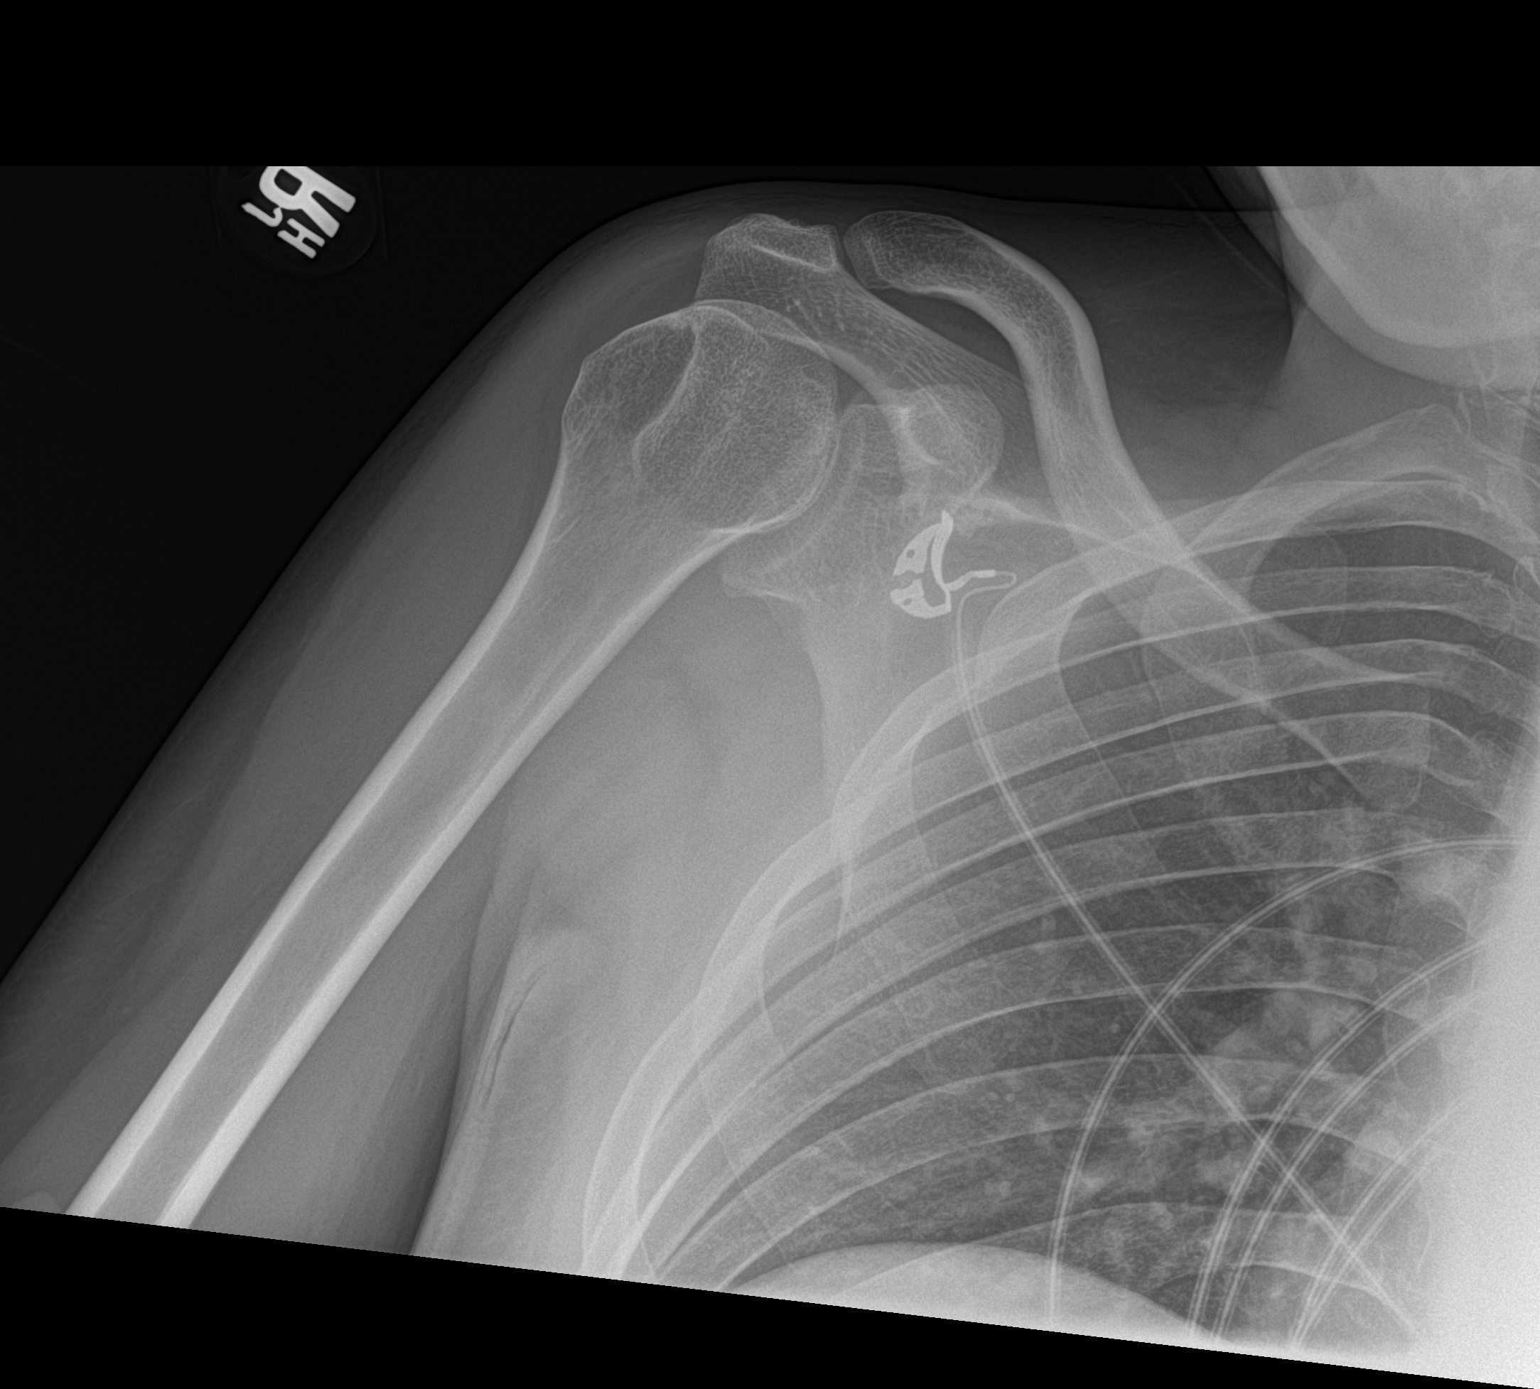

[2 of 2 positions shown; findings below may reference images not displayed]

FINDINGS: Reduction of right humeral head dislocation. No definite fracture.
IMPRESSION: Reduction of right humeral head dislocation.

## 2022-02-25 ENCOUNTER — Encounter (HOSPITAL_COMMUNITY): Payer: Self-pay | Admitting: Emergency Medicine

## 2022-02-25 ENCOUNTER — Ambulatory Visit (HOSPITAL_COMMUNITY)
Admission: EM | Admit: 2022-02-25 | Discharge: 2022-02-25 | Disposition: A | Discharge status: Home / Self Care | Payer: Commercial Managed Care - HMO | Attending: Family Medicine | Admitting: Family Medicine

## 2022-02-25 DIAGNOSIS — S60811A Abrasion of right wrist, initial encounter: Secondary | ICD-10-CM

## 2022-02-25 DIAGNOSIS — S0081XA Abrasion of other part of head, initial encounter: Secondary | ICD-10-CM

## 2022-02-25 DIAGNOSIS — S0990XA Unspecified injury of head, initial encounter: Secondary | ICD-10-CM

## 2022-02-25 DIAGNOSIS — T148XXA Other injury of unspecified body region, initial encounter: Secondary | ICD-10-CM

## 2022-02-25 NOTE — ED Triage Notes (Signed)
Pt reports "I was having fun and I fell and hit my head". Believes happened yesterday. Denies LOC.

## 2022-02-25 NOTE — ED Provider Notes (Signed)
[facility]    CSN: [id number] Arrival date & time: 02/25/22  1657      History   Chief Complaint Chief Complaint  Patient presents with   Head Injury    HPI Stephen [name] is a 27 y.o. male.    Head Injury  Here for a head injury that occurred yesterday.  He states he tripped and fell forward and hit his left forehead on the concrete.  He did not lose consciousness.  Since then he does not note any dizziness or feeling like he is floating.  Also he does not have any headache.  He does not take any blood thinners.  He is uncertain when his last tetanus was.  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   Encounter for long-term (current) use of high-risk medication 12/23/2014   ODD (oppositional defiant disorder) 11/12/2014   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  Authorizing Provider  amantadine (SYMMETREL) 100 MG capsule Take 1 capsule (100 mg total) by mouth every 12 (twelve) hours. Patient not taking: Reported on 09/16/2021 06/09/21 07/09/21  Phineas Inches, MD  benztropine (COGENTIN) 1 MG tablet Take 1 tablet (1 mg total) by mouth 3 (three) times daily before meals. Patient not taking: Reported on 09/16/2021 06/09/21 07/09/21  Phineas Inches, MD  escitalopram (LEXAPRO) 20 MG tablet Take 1 tablet (20 mg total) by mouth daily. Patient not taking: Reported on 09/16/2021 06/10/21 07/10/21  Phineas Inches, MD  lamoTRIgine (LAMICTAL) 25 MG CHEW chewable tablet Chew 50 mg by mouth at bedtime. 09/03/21   [provider]  lamoTRIgine (LAMICTAL) 25 MG tablet Take 2 tablets (50 mg total) by mouth at bedtime. Patient not taking: Reported on 09/16/2021 07/20/21 08/25/22  Van Clines, MD  multivitamin (RENA-VIT) TABS tablet Take 1 tablet by mouth at bedtime. Patient not taking: Reported on 09/16/2021 06/09/21   Phineas Inches, MD  OLANZapine (ZYPREXA) 2.5 MG tablet Take 1 tablet (2.5 mg total) by mouth 2 (two) times daily. Patient not taking: Reported on 09/16/2021 06/09/21 07/09/21  Phineas Inches, MD    Family History Family History  Problem Relation Age of Onset   Diabetes gravidarum Paternal Grandmother    Heart attack Paternal Grandmother    Healthy Mother    Healthy Father     Social History Social History   Tobacco Use   Smoking status: Every Day    Types: Cigarettes   Smokeless tobacco:  Never  Vaping Use   Vaping Use: Every day   Substances: Nicotine  Substance Use Topics   Alcohol use: Yes    Alcohol/week: 3.0 standard drinks of alcohol    Types: 3 Cans of beer per week    Comment: beer today with "weed pen"   Drug use: Yes    Types: Marijuana     Allergies   Lactose intolerance (gi)   Review of Systems Review of Systems   Physical Exam Triage Vital Signs ED Triage Vitals  Enc Vitals Group     BP 02/25/22 1910  (!) 141/89     Pulse Rate 02/25/22 1910 100     Resp 02/25/22 1910 15     Temp 02/25/22 1910 98 F (36.7 C)     Temp Source 02/25/22 1910 Oral     SpO2 02/25/22 1910 100 %     Weight --      Height --      Head Circumference --      Peak Flow --      Pain Score 02/25/22 1909 2     Pain Loc --      Pain Edu? --      Excl. in [place]? --    No data found.  Updated Vital Signs BP (!) 141/89 (BP Location: Left Arm)   Pulse 100   Temp 98 F (36.7 C) (Oral)   Resp 15   SpO2 100%   Visual Acuity Right Eye Distance:   Left Eye Distance:   Bilateral Distance:    Right Eye Near:   Left Eye Near:    Bilateral Near:     Physical Exam Vitals reviewed.  Constitutional:      General: He is not in acute distress.    Appearance: He is not ill-appearing, toxic-appearing or diaphoretic.  HENT:     Head:     Comments: There is an abrasion on his left frontal area over his brow that is approximately 3 cm in diameter.  It is not bleeding.  There is some mild swelling medial and lateral to this about 1 cm in width on each side.  There is no drainage.    Right Ear: Tympanic membrane and ear canal normal.     Left Ear: Tympanic membrane and ear canal normal.     Nose: Nose normal.     Mouth/Throat:     Mouth: Mucous membranes are moist.     Pharynx: No oropharyngeal exudate or posterior oropharyngeal erythema.  Eyes:     Extraocular Movements: Extraocular movements intact.     Conjunctiva/sclera: Conjunctivae normal.     Pupils: Pupils are equal, round, and reactive to light.     Funduscopic exam:    Right eye: No hemorrhage or papilledema.        Left eye: No hemorrhage or papilledema.  Cardiovascular:     Rate and Rhythm: Normal rate and regular rhythm.     Heart sounds: No murmur heard. Pulmonary:     Effort: Pulmonary effort is normal.     Breath sounds: Normal breath sounds.  Musculoskeletal:     Cervical back: Neck supple.  Lymphadenopathy:     Cervical: No cervical  adenopathy.  Skin:    Capillary Refill: Capillary refill takes less than 2 seconds.     Coloration: Skin is not jaundiced or pale.     Comments: In addition to the abrasion on his head he has a dime sized abrasion on the dorsal surface  of his right wrist.  It also is not bleeding and there is no induration or drainage.  No erythema  Neurological:     General: No focal deficit present.     Mental Status: He is alert and oriented to person, place, and time.     Cranial Nerves: No cranial nerve deficit.     Sensory: No sensory deficit.     Motor: No weakness.     Coordination: Coordination normal.     Gait: Gait normal.     Deep Tendon Reflexes: Reflexes normal.  Psychiatric:        Behavior: Behavior normal.      UC Treatments / Results  Labs (all labs ordered are listed, but only abnormal results are displayed) Labs Reviewed - No data to display  EKG   Radiology No results found.  Procedures Procedures (including critical care time)  Medications Ordered in UC Medications - No data to display  Initial Impression / Assessment and Plan / UC Course  I have reviewed the triage vital signs and the nursing notes.  Pertinent labs & imaging results that were available during my care of the patient were reviewed by me and considered in my medical decision making (see chart for details).        Exam is normal.  He does not really have any concussion symptoms either.  On review of the immunization tab he had a tetanus booster in November 2022 when he was seen in the ER for a chin laceration.  I have asked him to ice and elevate the sore areas and to use Neosporin on the abrasion till it starting to heal. Final Clinical Impressions(s) / UC Diagnoses   Final diagnoses:  Closed head injury, initial encounter  Abrasion     Discharge Instructions      You had a tetanus shot in November 2022  You can find Neosporin or triple antibiotic ointment in the store over-the-counter.   Clean your scrape wounds with soapy water or peroxide and then put new antibiotic ointment on them.  You can keep them covered if you are going to be in dusty or dirty area.  Ice and elevate your wrist and your head tonight and tomorrow.     ED Prescriptions   None    PDMP not reviewed this encounter.   Barrett Henle, MD 02/25/22 1929

## 2022-02-25 NOTE — Discharge Instructions (Signed)
You had a tetanus shot in November 2022  You can find Neosporin or triple antibiotic ointment in the store over-the-counter.  Clean your scrape wounds with soapy water or peroxide and then put new antibiotic ointment on them.  You can keep them covered if you are going to be in dusty or dirty area.  Ice and elevate your wrist and your head tonight and tomorrow.

## 2022-02-27 ENCOUNTER — Other Ambulatory Visit: Payer: Self-pay

## 2022-02-27 ENCOUNTER — Emergency Department (HOSPITAL_COMMUNITY): Payer: Commercial Managed Care - HMO

## 2022-02-27 ENCOUNTER — Emergency Department (HOSPITAL_COMMUNITY)
Admission: EM | Admit: 2022-02-27 | Discharge: 2022-02-27 | Discharge status: Against Medical Advice | Payer: Commercial Managed Care - HMO | Attending: Emergency Medicine | Admitting: Emergency Medicine

## 2022-02-27 DIAGNOSIS — R059 Cough, unspecified: Secondary | ICD-10-CM | POA: Insufficient documentation

## 2022-02-27 DIAGNOSIS — R0602 Shortness of breath: Secondary | ICD-10-CM | POA: Diagnosis not present

## 2022-02-27 DIAGNOSIS — Z5321 Procedure and treatment not carried out due to patient leaving prior to being seen by health care provider: Secondary | ICD-10-CM | POA: Insufficient documentation

## 2022-02-27 NOTE — ED Triage Notes (Signed)
Pt bib ems from home c/o shortness of breath and cough x few days.

## 2022-05-08 ENCOUNTER — Other Ambulatory Visit: Payer: Self-pay

## 2022-05-08 ENCOUNTER — Emergency Department (HOSPITAL_COMMUNITY): Payer: 59

## 2022-05-08 ENCOUNTER — Emergency Department (HOSPITAL_COMMUNITY)
Admission: EM | Admit: 2022-05-08 | Discharge: 2022-05-09 | Disposition: A | Discharge status: Home / Self Care | Payer: 59 | Attending: Emergency Medicine | Admitting: Emergency Medicine

## 2022-05-08 ENCOUNTER — Encounter (HOSPITAL_COMMUNITY): Payer: Self-pay

## 2022-05-08 DIAGNOSIS — S01521A Laceration with foreign body of lip, initial encounter: Secondary | ICD-10-CM | POA: Diagnosis not present

## 2022-05-08 DIAGNOSIS — S01511A Laceration without foreign body of lip, initial encounter: Secondary | ICD-10-CM

## 2022-05-08 DIAGNOSIS — S0003XA Contusion of scalp, initial encounter: Secondary | ICD-10-CM

## 2022-05-08 DIAGNOSIS — S0990XA Unspecified injury of head, initial encounter: Secondary | ICD-10-CM | POA: Diagnosis not present

## 2022-05-08 DIAGNOSIS — S0993XA Unspecified injury of face, initial encounter: Secondary | ICD-10-CM | POA: Diagnosis present
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
## 2022-05-09 DIAGNOSIS — S01521A Laceration with foreign body of lip, initial encounter: Secondary | ICD-10-CM | POA: Diagnosis not present

## 2022-05-09 MED ORDER — LIDOCAINE-EPINEPHRINE-TETRACAINE (LET) TOPICAL GEL
3.0000 mL | Freq: Once | TOPICAL | Status: AC
  Administered 2022-05-09: 3 mL via TOPICAL
  Filled 2022-05-09: qty 3

## 2022-05-09 MED ORDER — LIDOCAINE HCL 1 % IJ SOLN
INTRAMUSCULAR | Status: AC
  Filled 2022-05-09: qty 20

## 2022-05-09 MED ORDER — CEPHALEXIN 500 MG PO CAPS: 500.0000 mg | ORAL_CAPSULE | Freq: Four times a day (QID) | ORAL | 0 refills | Status: AC

## 2022-05-09 MED ORDER — CEPHALEXIN 500 MG PO CAPS
500.0000 mg | ORAL_CAPSULE | Freq: Once | ORAL | Status: AC
Start: 2022-05-09 — End: 2022-05-09
  Administered 2022-05-09: 500 mg via ORAL
  Filled 2022-05-09: qty 1

## 2022-05-09 MED ORDER — HYDROGEN PEROXIDE 3 % EX SOLN
CUTANEOUS | Status: AC
  Filled 2022-05-09: qty 473

## 2022-05-09 MED ORDER — LIDOCAINE HCL (PF) 1 % IJ SOLN: 30.0000 mL | Freq: Once | INTRAMUSCULAR | Status: DC
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
## 2022-05-09 NOTE — Discharge Instructions (Addendum)
Take Keflex as prescribed until finished. Keep the area clean with mild soap and warm water. Do not apply peroxide or alcohol to your wound as this can break down newly forming skin and prolong wound healing. Swish water after eating to prevent particles from becoming embedded in your wound. Do not use a straw for 1 week. If you keep the area bandaged, change the dressing/bandage at least once per day. Have your staples/sutures removed in 7 days if they have not dissolved.

## 2023-02-16 ENCOUNTER — Encounter (HOSPITAL_COMMUNITY): Payer: Self-pay

## 2023-02-16 ENCOUNTER — Other Ambulatory Visit: Payer: Self-pay

## 2023-02-16 ENCOUNTER — Emergency Department (HOSPITAL_COMMUNITY): Payer: MEDICAID

## 2023-02-16 ENCOUNTER — Emergency Department (HOSPITAL_COMMUNITY)
Admission: EM | Admit: 2023-02-16 | Discharge: 2023-02-16 | Disposition: A | Discharge status: Home / Self Care | Payer: MEDICAID | Attending: Emergency Medicine | Admitting: Emergency Medicine

## 2023-02-16 DIAGNOSIS — R059 Cough, unspecified: Secondary | ICD-10-CM | POA: Diagnosis present

## 2023-02-16 DIAGNOSIS — J029 Acute pharyngitis, unspecified: Secondary | ICD-10-CM | POA: Insufficient documentation

## 2023-02-16 DIAGNOSIS — Z20822 Contact with and (suspected) exposure to covid-19: Secondary | ICD-10-CM | POA: Diagnosis not present

## 2023-02-16 LAB — RESP PANEL BY RT-PCR (RSV, FLU A&B, COVID)  RVPGX2
Influenza A by PCR: NEGATIVE
Influenza B by PCR: NEGATIVE
Resp Syncytial Virus by PCR: NEGATIVE
SARS Coronavirus 2 by RT PCR: NEGATIVE

## 2023-02-16 LAB — GROUP A STREP BY PCR: Group A Strep by PCR: NOT DETECTED

## 2023-02-16 NOTE — ED Triage Notes (Signed)
 Pt reports with cough, runny nose, and bilateral ear popping for 4 days.

## 2023-02-16 NOTE — Discharge Instructions (Signed)
 You were seen in the ER today for concerns of a cough and sore throat. Your labs were negative for COVID, influenza, and RSV. You were also negative for strep. Your chest xray was negative for pneumonia. I would suggest following up with your primary care provider for further evaluation if symptoms are not improving. Return to the ER for new or worsening symptoms.

## 2023-02-16 NOTE — ED Provider Notes (Signed)
 [place] [facility]ERGENC[facility] DEPARTMENT AT [facility] Provider Note   CSN: [id number] Arrival date & time: 02/16/23  1806     History Chief Complaint  Patient presents with   Cough   Nasal Congestion   Otalgia    Stephen [name] is a 28 y.o. male.  Patient with past history significant for schizophrenia, seizure disorder, OCD, and disorganized thought process presents the emergency department with concerns of cough, runny nose, bilateral ear popping for the last 4 days. Mother in the room providing collateral history. Also endorsing some throat discomfort.  Denies any recent fevers, chills or bodyaches.  No sick contacts as far as patient is aware.  Denies any chest pain shortness of breath. Denies dysphagia.   Cough Associated symptoms: ear pain   Otalgia Associated symptoms: cough        Home Medications Prior to Admission medications   Medication Sig Start Date End Date Taking? Authorizing Provider  amantadine  (SYMMETREL ) 100 MG capsule Take 1 capsule (100 mg total) by mouth every 12 (twelve) hours. Patient not taking: Reported on 09/16/2021 06/09/21 07/09/21  Johny Lot, MD  benztropine  (COGENTIN ) 1 MG tablet Take 1 tablet (1 mg total) by mouth 3 (three) times daily before meals. Patient not taking: Reported on 09/16/2021 06/09/21 07/09/21  Johny Lot, MD  cephALEXin  (KEFLEX ) 500 MG capsule Take 1 capsule (500 mg total) by mouth 4 (four) times daily. 05/09/22   Keith Sor, [name]  escitalopram  (LEXAPRO ) 20 MG tablet Take 1 tablet (20 mg total) by mouth daily. Patient not taking: Reported on 09/16/2021 06/10/21 07/10/21  Johny Lot, MD  lamoTRIgine  (LAMICTAL ) 25 MG CHEW chewable tablet Chew 50 mg by mouth at bedtime. 09/03/21   [provider]  lamoTRIgine  (LAMICTAL ) 25 MG tablet Take 2 tablets (50 mg total) by mouth at bedtime. Patient not taking: Reported on 09/16/2021 07/20/21 08/25/22  Georjean Darice HERO, MD  multivitamin (RENA-VIT) TABS tablet Take 1  tablet by mouth at bedtime. Patient not taking: Reported on 09/16/2021 06/09/21   Johny Lot, MD  OLANZapine  (ZYPREXA ) 2.5 MG tablet Take 1 tablet (2.5 mg total) by mouth 2 (two) times daily. Patient not taking: Reported on 09/16/2021 06/09/21 07/09/21  Johny Lot, MD      Allergies    Lactose intolerance (gi)    Review of Systems   Review of Systems  HENT:  Positive for ear pain.   Respiratory:  Positive for cough.   All other systems reviewed and are negative.   Physical Exam Updated Vital Signs BP 121/86 (BP Location: Right Arm)   Pulse (!) 102   Temp 98.2 F (36.8 C) (Oral)   Resp 18   Ht 5' 9 (1.753 m)   Wt 72.6 kg   SpO2 100%   BMI 23.63 kg/m  Physical Exam Vitals and nursing note reviewed.  Constitutional:      General: He is not in acute distress.    Appearance: He is well-developed.  HENT:     Head: Normocephalic and atraumatic.     Comments: Uvula midline.  No oropharyngeal swelling.  Mild erythema in the posterior oropharynx.  No tonsillar exudate.    Nose: Nose normal. No congestion.     Mouth/Throat:     Mouth: Mucous membranes are moist.  Eyes:     Conjunctiva/sclera: Conjunctivae normal.  Neck:     Comments: No anterior chain cervical adenopathy. Cardiovascular:     Rate and Rhythm: Normal rate and regular rhythm.     Heart sounds:  No murmur heard. Pulmonary:     Effort: Pulmonary effort is normal. No respiratory distress.     Breath sounds: Normal breath sounds.  Abdominal:     Palpations: Abdomen is soft.     Tenderness: There is no abdominal tenderness.  Musculoskeletal:        General: No swelling.     Cervical back: Neck supple.  Skin:    General: Skin is warm and dry.     Capillary Refill: Capillary refill takes less than 2 seconds.  Neurological:     Mental Status: He is alert.  Psychiatric:        Mood and Affect: Mood normal.     ED Results / Procedures / Treatments   Labs (all labs ordered are listed, but only  abnormal results are displayed) Labs Reviewed  RESP PANEL BY RT-PCR (RSV, FLU A&B, COVID)  [id number]  GROUP A STREP BY PCR    EKG None  Radiology DG Chest 2 View Result Date: 02/16/2023 CLINICAL DATA:  Cough. EXAM: CHEST - 2 VIEW COMPARISON:  02/27/2022 FINDINGS: The cardiomediastinal contours are normal. The lungs are clear. Pulmonary vasculature is normal. No consolidation, pleural effusion, or pneumothorax. No acute osseous abnormalities are seen. IMPRESSION: No active cardiopulmonary disease. Electronically Signed   By: Andrea Gasman M.D.   On: 02/16/2023 20:14    Procedures Procedures    Medications Ordered in ED Medications - No data to display  ED Course/ Medical Decision Making/ A&P                                 Medical Decision Making  This patient presents to the ED for concern of cough.  Differential diagnosis includes viral URI, viral pharyngitis, strep pharyngitis, pneumonia, bronchitis   Lab Tests:  I Ordered, and personally interpreted labs.  The pertinent results include: Respiratory panel negative, group A strep negative   Imaging Studies ordered:  I ordered imaging studies including chest x-ray I independently visualized and interpreted imaging which showed no acute cardiopulmonary process I agree with the radiologist interpretation   Problem List / ED Course:  Patient presents the ER with concerns of coughing and nose.  Also endorsing bilateral ear popping for the last 4 days.  No changes in hearing.  Denies any recent fever chills or bodyaches.  No sick contacts.  Respiratory panel and chest x-ray ordered from triage for evaluation.  Added on group A strep. Patient tachycardiac but afebrile and not tachypneic. Doubt sepsis. Respiratory panel negative for COVID-19, influenza, and RSV. Group A strep negative. Chest xray unremarkable. Given physical exam findings of mild oropharyngeal erythema, suspect this is likely a viral pharyngitis. No clinical  findings to suggest strep pharyngitis. No uvular deviation on tonsillar swelling concerning for PTA or RPA. Suspect viral process that can be managed at home with OTC medications. Advised strict return precautions if symptoms are acutely changing or worsening. Otherwise suggested outpatient follow up with PCP.  Final Clinical Impression(s) / ED Diagnoses Final diagnoses:  Viral pharyngitis    Rx / DC Orders ED Discharge Orders     None         Cecily Legrand LABOR, [name] 02/16/23 2337    Cottie Donnice PARAS, MD 02/17/23 [phone]

## 2023-12-29 ENCOUNTER — Encounter: Payer: Self-pay | Admitting: Neurology

## 2024-03-01 ENCOUNTER — Encounter (HOSPITAL_COMMUNITY): Payer: Self-pay

## 2024-03-01 ENCOUNTER — Ambulatory Visit (HOSPITAL_COMMUNITY)
Admission: EM | Admit: 2024-03-01 | Discharge: 2024-03-01 | Disposition: A | Discharge status: Home / Self Care | Payer: MEDICAID | Attending: Family Medicine | Admitting: Family Medicine

## 2024-03-01 DIAGNOSIS — G40909 Epilepsy, unspecified, not intractable, without status epilepticus: Secondary | ICD-10-CM | POA: Insufficient documentation

## 2024-03-01 LAB — COMPREHENSIVE METABOLIC PANEL WITH GFR
ALT: 22 U/L (ref 0–44)
AST: 26 U/L (ref 15–41)
Albumin: 4.4 g/dL (ref 3.5–5.0)
Alkaline Phosphatase: 102 U/L (ref 38–126)
Anion gap: 14 (ref 5–15)
BUN: 9 mg/dL (ref 6–20)
CO2: 23 mmol/L (ref 22–32)
Calcium: 9.6 mg/dL (ref 8.9–10.3)
Chloride: 102 mmol/L (ref 98–111)
Creatinine, Ser: 0.7 mg/dL (ref 0.61–1.24)
GFR, Estimated: >60 mL/min
Glucose, Bld: 72 mg/dL (ref 70–99)
Potassium: 4.4 mmol/L (ref 3.5–5.1)
Sodium: 139 mmol/L (ref 135–145)
Total Bilirubin: 0.2 mg/dL (ref 0.0–1.2)
Total Protein: 8.1 g/dL (ref 6.5–8.1)

## 2024-03-01 LAB — CBC
HCT: 43.1 % (ref 39.0–52.0)
Hemoglobin: 14.1 g/dL (ref 13.0–17.0)
MCH: 29.9 pg (ref 26.0–34.0)
MCHC: 32.7 g/dL (ref 30.0–36.0)
MCV: 91.3 fL (ref 80.0–100.0)
Platelets: 328 K/uL (ref 150–400)
RBC: 4.72 MIL/uL (ref 4.22–5.81)
RDW: 12.3 % (ref 11.5–15.5)
WBC: 4.8 K/uL (ref 4.0–10.5)
nRBC: 0 % (ref 0.0–0.2)

## 2024-03-01 LAB — MAGNESIUM: Magnesium: 1.8 mg/dL (ref 1.7–2.4)

## 2024-03-01 LAB — GLUCOSE, POCT (MANUAL RESULT ENTRY): POC Glucose: 136 mg/dL — AB (ref 70–99)

## 2024-03-01 LAB — TSH: TSH: 4.5 u[IU]/mL (ref 0.350–4.500)

## 2024-03-01 NOTE — ED Triage Notes (Addendum)
 Patient states that he had a seizure 2 days ago. Patient's mother states the seizures are different now than usual. Mother states the patient is compliant with seizure medication. Patient states he has an appoint with [facility] in March.   When writer was going over medications on record it says that the patient is not taking any medication and the patient said he has been out of his medications x 3 years.

## 2024-03-01 NOTE — Discharge Instructions (Signed)
 You have had labs (blood tests related to seizures) sent today. We will call you with any significant abnormalities or if there is need to begin or change treatment or pursue further follow up.  You may also review your test results online through MyChart. If you do not have a MyChart account, instructions to sign up should be on your discharge paperwork.

## 2024-03-01 NOTE — ED Provider Notes (Signed)
 " [facility]   [id number] 03/01/24 Arrival Time: 1251  ASSESSMENT & PLAN:  1. Seizure disorder [facility]    Will call neurologist to see if they can work him in sooner than March.   Follow-up Information     [place] [facility] at [facility].   Specialty: Emergency Medicine Why: If you have another seizure. Contact information: [address] [place]  [phone]               Labs Pending:  TSH  COMPREHENSIVE METABOLIC PANEL WITH GFR  CBC  MAGNESIUM   LAMOTRIGINE  LEVEL    Results for orders placed or performed during the hospital encounter of 03/01/24  POCT glucose (manual entry)   Collection Time: 03/01/24  3:06 PM  Result Value Ref Range   POC Glucose 136 (A) 70 - 99 mg/dl     Follow-up Information     [place] [facility] at [facility].   Specialty: Emergency Medicine Why: If you have another seizure. Contact information: [address] [place]  [address] [phone]                Reviewed expectations re: course of current medical issues. Questions answered. Outlined signs and symptoms indicating need for more acute intervention. Understanding verbalized. After Visit Summary given.   SUBJECTIVE: History from: Patient and Family. Stephen [name] is a 29 y.o. male. Patient states that he had a seizure 2 days ago. Patient's mother states seizure was different from previous seizures. Has video of seizure on her phone. Mother states the patient is compliant with medications. Patient states he has an appoint with [facility] in March. Denies recent illnesses. Cigarette smoker. Denies recreational drug use.  OBJECTIVE:  Vitals:   03/01/24 1440  BP: 123/82  Pulse: 86  Resp: 14  Temp: 98.2 F (36.8 C)  TempSrc: Oral  SpO2: 98%    General appearance: alert; no distress Eyes: PERRLA; EOMI; conjunctiva normal HENT: [place]; AT;  without nasal congestion Neck: supple  Lungs: speaks full sentences without difficulty; unlabored Extremities: no edema Skin: warm and dry Neurologic: normal gait; CN 2-12 grossly intact Psychological: alert and cooperative; normal mood and affect  Labs: Results for orders placed or performed during the hospital encounter of 03/01/24  POCT glucose (manual entry)   Collection Time: 03/01/24  3:06 PM  Result Value Ref Range   POC Glucose 136 (A) 70 - 99 mg/dl   Labs Reviewed  GLUCOSE, POCT (MANUAL RESULT ENTRY) - Abnormal; Notable for the following components:      Result Value   POC Glucose 136 (*)    All other components within normal limits  TSH  COMPREHENSIVE METABOLIC PANEL WITH GFR  CBC  MAGNESIUM   LAMOTRIGINE  LEVEL    Imaging: No results found.  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   Depression    Seizures (HCC)    Social History   Socioeconomic History   Marital status: Single    Spouse name: Not on file   Number of children: 0   Years of education: Currently in college   Highest education level: Not on file  Occupational History   Occupation: Consulting Civil Engineer at [employer]  Tobacco Use   Smoking status: Every Day    Types: Cigarettes   Smokeless tobacco: Never  Vaping Use   Vaping status: Every Day   Substances: Nicotine , Flavoring  Substance and Sexual Activity  Alcohol use: Not Currently    Alcohol/week: 3.0 standard drinks of alcohol    Types: 3 Cans of beer per week    Comment: beer today with weed pen   Drug use: Not Currently    Types: Marijuana   Sexual activity: Not on file  Other Topics Concern   Not on file  Social History Narrative   Caster is attending [employer]. He is glass blower/designer in [employer].   Living with his father, step-mother, and siblings.   Right-handed.   He will sometimes drink a soda.   Social Drivers of Health   Tobacco Use: High Risk (03/01/2024)   Patient  History    Smoking Tobacco Use: Every Day    Smokeless Tobacco Use: Never    Passive Exposure: Not on file  Financial Resource Strain: Not on file  Food Insecurity: Not on file  Transportation Needs: Not on file  Physical Activity: Not on file  Stress: Not on file  Social Connections: Not on file  Intimate Partner Violence: Not on file  Depression (PHQ2-9): Medium Risk (05/26/2021)   Depression (PHQ2-9)    PHQ-2 Score: 7  Alcohol Screen: Low Risk (05/28/2021)   Alcohol Screen    Last Alcohol Screening Score (AUDIT): 7  Housing: Not on file  Utilities: Not on file  Health Literacy: Not on file   Family History  Problem Relation Age of Onset   Diabetes gravidarum Paternal Grandmother    Heart attack Paternal Grandmother    Healthy Mother    Healthy Father    Past Surgical History:  Procedure Laterality Date   CIRCUMCISION     SHOULDER CLOSED REDUCTION Right 12/08/2018   Procedure: CLOSED REDUCTION SHOULDER;  Surgeon: Sharl Selinda Dover, MD;  Location: [facility] OR;  Service: Orthopedics;  Laterality: Right;      [1]  Allergies Allergen Reactions   Lactose Intolerance (Gi) Diarrhea     Rolinda Rogue, MD 03/01/24 1536  "

## 2024-03-02 ENCOUNTER — Ambulatory Visit (HOSPITAL_COMMUNITY): Payer: Self-pay

## 2024-03-02 LAB — LAMOTRIGINE LEVEL: Lamotrigine Lvl: <1 ug/mL — ABNORMAL LOW (ref 2.0–20.0)

## 2024-05-01 ENCOUNTER — Ambulatory Visit: Payer: MEDICAID | Admitting: Neurology
